# Patient Record
Sex: Female | Born: 1995 | Hispanic: Yes | Marital: Married | Smoking: Current every day smoker
Health system: Southern US, Community
[De-identification: ages and names within clinical notes are randomized; demographics above are authoritative.]

## PROBLEM LIST (undated history)

## (undated) ENCOUNTER — Inpatient Hospital Stay: Payer: Self-pay

## (undated) DIAGNOSIS — F329 Major depressive disorder, single episode, unspecified: Secondary | ICD-10-CM

## (undated) DIAGNOSIS — D649 Anemia, unspecified: Secondary | ICD-10-CM

## (undated) DIAGNOSIS — D573 Sickle-cell trait: Secondary | ICD-10-CM

## (undated) DIAGNOSIS — F419 Anxiety disorder, unspecified: Secondary | ICD-10-CM

## (undated) DIAGNOSIS — F32A Depression, unspecified: Secondary | ICD-10-CM

## (undated) DIAGNOSIS — C801 Malignant (primary) neoplasm, unspecified: Secondary | ICD-10-CM

## (undated) DIAGNOSIS — Z5189 Encounter for other specified aftercare: Secondary | ICD-10-CM

## (undated) HISTORY — DX: Encounter for other specified aftercare: Z51.89

## (undated) HISTORY — PX: NECK LESION BIOPSY: SHX2078

## (undated) HISTORY — PX: PORT-A-CATH REMOVAL: SHX5289

---
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
## 2016-05-18 ENCOUNTER — Emergency Department: Payer: Medicaid Other

## 2016-05-18 ENCOUNTER — Encounter: Payer: Self-pay | Admitting: Medical Oncology

## 2016-05-18 ENCOUNTER — Emergency Department
Admission: EM | Admit: 2016-05-18 | Discharge: 2016-05-18 | Disposition: A | Discharge status: Home / Self Care | Payer: Medicaid Other | Attending: Emergency Medicine | Admitting: Emergency Medicine

## 2016-05-18 DIAGNOSIS — Z85828 Personal history of other malignant neoplasm of skin: Secondary | ICD-10-CM | POA: Insufficient documentation

## 2016-05-18 DIAGNOSIS — Y929 Unspecified place or not applicable: Secondary | ICD-10-CM | POA: Diagnosis not present

## 2016-05-18 DIAGNOSIS — Y998 Other external cause status: Secondary | ICD-10-CM | POA: Insufficient documentation

## 2016-05-18 DIAGNOSIS — Y9302 Activity, running: Secondary | ICD-10-CM | POA: Diagnosis not present

## 2016-05-18 DIAGNOSIS — W2203XA Walked into furniture, initial encounter: Secondary | ICD-10-CM | POA: Diagnosis not present

## 2016-05-18 DIAGNOSIS — S99921A Unspecified injury of right foot, initial encounter: Secondary | ICD-10-CM | POA: Insufficient documentation

## 2016-05-18 NOTE — ED Notes (Signed)
Pt verbalized understanding of discharge instructions. NAD at this time. 

## 2016-05-18 NOTE — ED Provider Notes (Signed)
[facility] Emergency Department Provider Note  ____________________________________________  Time seen: Approximately 4:25 PM  I have reviewed the triage vital signs and the nursing notes.   HISTORY  Chief Complaint Toe Injury    HPI Michele Alexander is a 20 y.o. female that presents to emergency department with second metatarsal pain on right foot after stubbing it this morning. Patient states that she is having a lot of pain in that toe. Pain does not radiate. No swelling or bruising. Patient states that she would like to know if it is broken because she is on her feet a lot at work. Patient has not taken anything for pain. Patient denies any other injury.   Past Medical History:  Diagnosis Date  . Anemia   . Anxiety   . Cancer ([place])    Hodgkin's Lymphoma  . Depression   . Hodgkin's disease(201)     Patient Active Problem List   Diagnosis Date Noted  . Abortion, missed 12/21/2015  . Young primigravida 04/23/2015  . Supervision of high risk pregnancy, antepartum 04/23/2015  . History of Hodgkin's lymphoma 04/23/2015  . Sickle cell trait ([place]) 04/23/2015  . Labor and delivery indication for care or intervention 04/21/2015  . Indication for care in labor and delivery, antepartum 04/19/2015  . Pelvic pressure in pregnancy, antepartum 03/21/2015  . Swelling 02/17/2015  . Nausea and vomiting in pregnancy 01/15/2015  . Nausea & vomiting 01/14/2015  . Pregnancy 01/05/2015    Past Surgical History:  Procedure Laterality Date  . DILATION AND EVACUATION N/A 12/21/2015   Procedure: DILATATION AND EVACUATION;  Surgeon: Will Bonnet, MD;  Location: [facility] ORS;  Service: Gynecology;  Laterality: N/A;  . NECK LESION BIOPSY    . OTHER SURGICAL HISTORY N/A 2015   Endoscopy  . PORT-A-CATH REMOVAL    . PORTACATH PLACEMENT Left 2010    Prior to Admission medications   Medication Sig Start Date End Date Taking? Authorizing Provider   acetaminophen-codeine (TYLENOL #3) 300-30 MG tablet Take 2 tablets by mouth every 4 (four) hours as needed for moderate pain. 12/21/15   Will Bonnet, MD  ibuprofen (ADVIL,MOTRIN) 600 MG tablet Take 1 tablet (600 mg total) by mouth every 6 (six) hours as needed for mild pain or cramping. 12/21/15   Will Bonnet, MD  traMADol (ULTRAM) 50 MG tablet Take 1 tablet (50 mg total) by mouth every 6 (six) hours as needed. 12/19/15 12/18/16  Gregor Hams, MD    Allergies Other; Nexium [esomeprazole magnesium]; and Cheese  Family History  Problem Relation Age of Onset  . Hypertension Mother   . Diabetes Maternal Grandfather   . Hypertension Maternal Grandfather   . Cancer Maternal Grandmother   . Cancer Paternal Grandfather     Social History Social History  Substance Use Topics  . Smoking status: Never Smoker  . Smokeless tobacco: Never Used  . Alcohol use No     Review of Systems  Constitutional: No fever/chills Cardiovascular: no chest pain. Respiratory: no cough. No SOB. Gastrointestinal: No nausea, no vomiting.  Musculoskeletal: Negative for musculoskeletal pain. Skin: Negative for rash, abrasions, ecchymosis.  ____________________________________________   PHYSICAL EXAM:  VITAL SIGNS: ED Triage Vitals  Enc Vitals Group     BP 05/18/16 1541 121/72     Pulse Rate 05/18/16 1541 85     Resp 05/18/16 1541 16     Temp 05/18/16 1541 97.9 F (36.6 C)     Temp Source 05/18/16 1541 Oral  SpO2 05/18/16 1541 100 %     Weight 05/18/16 1538 82 lb (37.2 kg)     Height 05/18/16 1538 4\' 9"  (1.448 m)     Head Circumference --      Peak Flow --      Pain Score 05/18/16 1538 8     Pain Loc --      Pain Edu? --      Excl. in [place]? --      Constitutional: Alert and oriented. Well appearing and in no acute distress. Eyes: Conjunctivae are normal. PERRL. EOMI. Head: Atraumatic. ENT:      Ears:       Nose: No congestion/rhinnorhea.      Mouth/Throat: Mucous membranes  are moist.  Neck: No stridor.  Cardiovascular: Normal rate, regular rhythm. Normal S1 and S2.  Good peripheral circulation. Respiratory: Normal respiratory effort without tachypnea or retractions. Lungs CTAB. Good air entry to the bases with no decreased or absent breath sounds. Musculoskeletal: Full range of motion to all extremities. No gross deformities appreciated. Full range of motion of the second metatarsal on right foot. Neurologic:  Normal speech and language. No gross focal neurologic deficits are appreciated.  Skin:  Skin is warm, dry and intact. No rash noted. No bruising or swelling to the second metatarsal on right foot. Psychiatric: Mood and affect are normal. Speech and behavior are normal. Patient exhibits appropriate insight and judgement.   ____________________________________________   LABS (all labs ordered are listed, but only abnormal results are displayed)  Labs Reviewed - No data to display ____________________________________________  EKG   ____________________________________________  RADIOLOGY Robinette Haines, personally viewed and evaluated these images (plain radiographs) as part of my medical decision making, as well as reviewing the written report by the radiologist.  Dg Toe 2nd Right  Result Date: 05/18/2016 CLINICAL DATA:  Ran into couch with right foot. Second toe pain and numbness. Initial encounter. EXAM: RIGHT SECOND TOE COMPARISON:  None. FINDINGS: There is no evidence of fracture or dislocation. There is no evidence of arthropathy or other focal bone abnormality. Soft tissues are unremarkable. IMPRESSION: Negative. Electronically Signed   By: Logan Bores M.D.   On: 05/18/2016 17:10    ____________________________________________    PROCEDURES  Procedure(s) performed:    Procedures    Medications - No data to display   ____________________________________________   INITIAL IMPRESSION / ASSESSMENT AND PLAN / ED  COURSE  Pertinent labs & imaging results that were available during my care of the patient were reviewed by me and considered in my medical decision making (see chart for details).  Review of the [place] CSRS was performed in accordance of the [place] prior to dispensing any controlled drugs.  Clinical Course     Patient's diagnosis is consistent with toe injury. No bony abnormalities seen on x-ray. Patient is to follow up with PCP as needed or otherwise directed. Patient is given ED precautions to return to the ED for any worsening or new symptoms.     ____________________________________________  FINAL CLINICAL IMPRESSION(S) / ED DIAGNOSES  Final diagnoses:  Injury of toe on right foot, initial encounter      NEW MEDICATIONS STARTED DURING THIS VISIT:  Discharge Medication List as of 05/18/2016  5:32 PM          This chart was dictated using voice recognition software/Dragon. Despite best efforts to proofread, errors can occur which can change the meaning. Any change was purely unintentional.   Laban Emperor, PA-C 05/18/16 1752  Delman Kitten, MD 05/25/16 [phone]

## 2016-05-18 NOTE — ED Triage Notes (Signed)
Pt stubbed rt foot second toe yesterday.

## 2016-06-20 ENCOUNTER — Emergency Department
Admission: EM | Admit: 2016-06-20 | Discharge: 2016-06-20 | Disposition: A | Discharge status: Home / Self Care | Payer: Medicaid Other | Attending: Emergency Medicine | Admitting: Emergency Medicine

## 2016-06-20 ENCOUNTER — Emergency Department: Payer: Medicaid Other

## 2016-06-20 DIAGNOSIS — N83202 Unspecified ovarian cyst, left side: Secondary | ICD-10-CM | POA: Diagnosis not present

## 2016-06-20 DIAGNOSIS — R102 Pelvic and perineal pain: Secondary | ICD-10-CM | POA: Diagnosis present

## 2016-06-20 DIAGNOSIS — Z791 Long term (current) use of non-steroidal anti-inflammatories (NSAID): Secondary | ICD-10-CM | POA: Diagnosis not present

## 2016-06-20 DIAGNOSIS — Z79899 Other long term (current) drug therapy: Secondary | ICD-10-CM | POA: Diagnosis not present

## 2016-06-20 LAB — COMPREHENSIVE METABOLIC PANEL
ALBUMIN: 3.8 g/dL (ref 3.5–5.0)
ALT: 16 U/L (ref 14–54)
AST: 21 U/L (ref 15–41)
Alkaline Phosphatase: 93 U/L (ref 38–126)
Anion gap: 6 (ref 5–15)
BUN: 11 mg/dL (ref 6–20)
CHLORIDE: 105 mmol/L (ref 101–111)
CO2: 26 mmol/L (ref 22–32)
Calcium: 8.8 mg/dL — ABNORMAL LOW (ref 8.9–10.3)
Creatinine, Ser: 0.58 mg/dL (ref 0.44–1.00)
GFR calc Af Amer: >60 mL/min (ref >60)
Glucose, Bld: 97 mg/dL (ref 65–99)
Potassium: 3.8 mmol/L (ref 3.5–5.1)
SODIUM: 137 mmol/L (ref 135–145)
Total Bilirubin: 0.1 mg/dL — ABNORMAL LOW (ref 0.3–1.2)
Total Protein: 7.1 g/dL (ref 6.5–8.1)

## 2016-06-20 LAB — CBC
HCT: 34.8 % — ABNORMAL LOW (ref 35.0–47.0)
Hemoglobin: 11.7 g/dL — ABNORMAL LOW (ref 12.0–16.0)
MCH: 25.7 pg — ABNORMAL LOW (ref 26.0–34.0)
MCHC: 33.7 g/dL (ref 32.0–36.0)
MCV: 76.4 fL — ABNORMAL LOW (ref 80.0–100.0)
Platelets: 395 10*3/uL (ref 150–440)
RBC: 4.56 MIL/uL (ref 3.80–5.20)
RDW: 14.6 % — AB (ref 11.5–14.5)
WBC: 4.7 10*3/uL (ref 3.6–11.0)

## 2016-06-20 LAB — POCT PREGNANCY, URINE: PREG TEST UR: NEGATIVE

## 2016-06-20 LAB — URINALYSIS, COMPLETE (UACMP) WITH MICROSCOPIC
BACTERIA UA: NONE SEEN
BILIRUBIN URINE: NEGATIVE
Glucose, UA: NEGATIVE mg/dL
Hgb urine dipstick: NEGATIVE
KETONES UR: NEGATIVE mg/dL
LEUKOCYTES UA: NEGATIVE
Nitrite: NEGATIVE
PROTEIN: NEGATIVE mg/dL
RBC / HPF: NONE SEEN RBC/hpf (ref 0–5)
SPECIFIC GRAVITY, URINE: 1.019 (ref 1.005–1.030)
pH: 8 (ref 5.0–8.0)

## 2016-06-20 LAB — LIPASE, BLOOD: Lipase: 35 U/L (ref 11–51)

## 2016-06-20 MED ORDER — ACETAMINOPHEN 500 MG PO TABS
1000.0000 mg | ORAL_TABLET | Freq: Once | ORAL | Status: AC
  Administered 2016-06-20: 1000 mg via ORAL
  Filled 2016-06-20: qty 2

## 2016-06-20 MED ORDER — NAPROXEN 500 MG PO TABS: 500.0000 mg | ORAL_TABLET | Freq: Two times a day (BID) | ORAL | 2 refills | Status: DC

## 2016-06-20 NOTE — ED Notes (Signed)
Dr Kinner at bedside. 

## 2016-06-20 NOTE — ED Provider Notes (Signed)
[facility] Emergency Department Provider Note   ____________________________________________    I have reviewed the triage vital signs and the nursing notes.   HISTORY  Chief Complaint Abdominal Pain and Mass     HPI Michele Alexander is a 21 y.o. female who presents with complaints of left-sided pelvic pain which is moderate. Patient reports this feels similar to when she had a ovarian cyst in the past. She reports the pain started today and has been unrelenting. She denies fevers or chills. She denies vaginal discharge. No nausea or vomiting. She feels that she can palpate something in her left pelvis   Past Medical History:  Diagnosis Date  . Anemia   . Anxiety   . Cancer ([place])    Hodgkin's Lymphoma  . Depression   . Hodgkin's disease(201)     Patient Active Problem List   Diagnosis Date Noted  . Abortion, missed 12/21/2015  . Young primigravida 04/23/2015  . Supervision of high risk pregnancy, antepartum 04/23/2015  . History of Hodgkin's lymphoma 04/23/2015  . Sickle cell trait ([place]) 04/23/2015  . Labor and delivery indication for care or intervention 04/21/2015  . Indication for care in labor and delivery, antepartum 04/19/2015  . Pelvic pressure in pregnancy, antepartum 03/21/2015  . Swelling 02/17/2015  . Nausea and vomiting in pregnancy 01/15/2015  . Nausea & vomiting 01/14/2015  . Pregnancy 01/05/2015    Past Surgical History:  Procedure Laterality Date  . DILATION AND EVACUATION N/A 12/21/2015   Procedure: DILATATION AND EVACUATION;  Surgeon: Will Bonnet, MD;  Location: [facility] ORS;  Service: Gynecology;  Laterality: N/A;  . NECK LESION BIOPSY    . OTHER SURGICAL HISTORY N/A 2015   Endoscopy  . PORT-A-CATH REMOVAL    . PORTACATH PLACEMENT Left 2010    Prior to Admission medications   Medication Sig Start Date End Date Taking? Authorizing Provider  acetaminophen-codeine (TYLENOL #3) 300-30 MG tablet Take 2  tablets by mouth every 4 (four) hours as needed for moderate pain. 12/21/15   Will Bonnet, MD  ibuprofen (ADVIL,MOTRIN) 600 MG tablet Take 1 tablet (600 mg total) by mouth every 6 (six) hours as needed for mild pain or cramping. 12/21/15   Will Bonnet, MD  traMADol (ULTRAM) 50 MG tablet Take 1 tablet (50 mg total) by mouth every 6 (six) hours as needed. 12/19/15 12/18/16  Gregor Hams, MD     Allergies Other; Nexium [esomeprazole magnesium]; and Cheese  Family History  Problem Relation Age of Onset  . Hypertension Mother   . Diabetes Maternal Grandfather   . Hypertension Maternal Grandfather   . Cancer Maternal Grandmother   . Cancer Paternal Grandfather     Social History Social History  Substance Use Topics  . Smoking status: Never Smoker  . Smokeless tobacco: Never Used  . Alcohol use No    Review of Systems  Constitutional: No fever/chills  Cardiovascular: Denies chest pain. Respiratory: Denies shortness of breath. Gastrointestinal: As above  Genitourinary: Negative for dysuria. Musculoskeletal: Negative for back pain. Skin: Negative for rash.   10-point ROS otherwise negative.  ____________________________________________   PHYSICAL EXAM:  VITAL SIGNS: ED Triage Vitals  Enc Vitals Group     BP 06/20/16 1811 120/66     Pulse Rate 06/20/16 1811 85     Resp 06/20/16 1811 18     Temp 06/20/16 1811 98.2 F (36.8 C)     Temp Source 06/20/16 1811 Oral  SpO2 06/20/16 1811 100 %     Weight 06/20/16 1812 83 lb (37.6 kg)     Height 06/20/16 1812 4\' 9"  (1.448 m)     Head Circumference --      Peak Flow --      Pain Score 06/20/16 1812 7     Pain Loc --      Pain Edu? --      Excl. in [place]? --     Constitutional: Alert and oriented. No acute distress. Pleasant and interactive Eyes: Conjunctivae are normal.   Nose: No congestion/rhinnorhea. Mouth/Throat: Mucous membranes are moist.   Cardiovascular: Normal rate, regular rhythm.   Good  peripheral circulation. Respiratory: Normal respiratory effort.  No retractions. Lungs CTAB. Gastrointestinal: Mild tenderness to palpation in the left pelvis. No distention.  No CVA tenderness. Genitourinary: deferred Musculoskeletal: .  Warm and well perfused Neurologic:  Normal speech and language. No gross focal neurologic deficits are appreciated.  Skin:  Skin is warm, dry and intact. No rash noted. Psychiatric: Mood and affect are normal. Speech and behavior are normal.  ____________________________________________   LABS (all labs ordered are listed, but only abnormal results are displayed)  Labs Reviewed  COMPREHENSIVE METABOLIC PANEL - Abnormal; Notable for the following:       Result Value   Calcium 8.8 (*)    Total Bilirubin 0.1 (*)    All other components within normal limits  CBC - Abnormal; Notable for the following:    Hemoglobin 11.7 (*)    HCT 34.8 (*)    MCV 76.4 (*)    MCH 25.7 (*)    RDW 14.6 (*)    All other components within normal limits  URINALYSIS, COMPLETE (UACMP) WITH MICROSCOPIC - Abnormal; Notable for the following:    Color, Urine YELLOW (*)    APPearance CLEAR (*)    Squamous Epithelial / LPF 0-5 (*)    All other components within normal limits  LIPASE, BLOOD  POC URINE PREG, ED  POCT PREGNANCY, URINE   ____________________________________________  EKG  None ____________________________________________  RADIOLOGY  Consistent with left ovarian cyst, no torsion ____________________________________________   PROCEDURES  Procedure(s) performed: No    Critical Care performed: No ____________________________________________   INITIAL IMPRESSION / ASSESSMENT AND PLAN / ED COURSE  Pertinent labs & imaging results that were available during my care of the patient were reviewed by me and considered in my medical decision making (see chart for details).  Patient presents with left-sided pelvic discomfort. I do suspect ovarian cysts,  we will obtain ultrasound to rule out torsion. Analgesics provided.  Clinical Course   Ultrasound confirms 3.7 cm cyst but no torsion. We'll Rx analgesics and have the patient follow up with gynecology ____________________________________________   FINAL CLINICAL IMPRESSION(S) / ED DIAGNOSES  Final diagnoses:  Pelvic pain  Pelvic pain      NEW MEDICATIONS STARTED DURING THIS VISIT:  New Prescriptions   No medications on file     Note:  This document was prepared using Dragon voice recognition software and may include unintentional dictation errors.    Lavonia Drafts, MD 06/20/16 2212

## 2016-06-20 NOTE — ED Triage Notes (Signed)
Pt reports LLQ abdominal pain. Pt states pain feels like it is in her ovary. Pt states today she felt a lump at the pain site. Denies NVD.

## 2016-07-28 ENCOUNTER — Encounter: Payer: Self-pay | Admitting: Emergency Medicine

## 2016-07-28 ENCOUNTER — Emergency Department
Admission: EM | Admit: 2016-07-28 | Discharge: 2016-07-28 | Disposition: A | Discharge status: Home / Self Care | Payer: Medicaid Other | Attending: Emergency Medicine | Admitting: Emergency Medicine

## 2016-07-28 DIAGNOSIS — Z8571 Personal history of Hodgkin lymphoma: Secondary | ICD-10-CM | POA: Insufficient documentation

## 2016-07-28 DIAGNOSIS — Z79899 Other long term (current) drug therapy: Secondary | ICD-10-CM | POA: Insufficient documentation

## 2016-07-28 DIAGNOSIS — N9489 Other specified conditions associated with female genital organs and menstrual cycle: Secondary | ICD-10-CM | POA: Diagnosis not present

## 2016-07-28 DIAGNOSIS — N939 Abnormal uterine and vaginal bleeding, unspecified: Secondary | ICD-10-CM | POA: Diagnosis not present

## 2016-07-28 LAB — BASIC METABOLIC PANEL
ANION GAP: 6 (ref 5–15)
BUN: 10 mg/dL (ref 6–20)
CALCIUM: 8.6 mg/dL — AB (ref 8.9–10.3)
CHLORIDE: 102 mmol/L (ref 101–111)
CO2: 28 mmol/L (ref 22–32)
Creatinine, Ser: 0.71 mg/dL (ref 0.44–1.00)
GFR calc non Af Amer: >60 mL/min (ref >60)
Glucose, Bld: 88 mg/dL (ref 65–99)
POTASSIUM: 3.6 mmol/L (ref 3.5–5.1)
Sodium: 136 mmol/L (ref 135–145)

## 2016-07-28 LAB — CBC
HEMATOCRIT: 37.8 % (ref 35.0–47.0)
HEMOGLOBIN: 12.1 g/dL (ref 12.0–16.0)
MCH: 24.5 pg — ABNORMAL LOW (ref 26.0–34.0)
MCHC: 32 g/dL (ref 32.0–36.0)
MCV: 76.5 fL — ABNORMAL LOW (ref 80.0–100.0)
Platelets: 373 10*3/uL (ref 150–440)
RBC: 4.94 MIL/uL (ref 3.80–5.20)
RDW: 15 % — ABNORMAL HIGH (ref 11.5–14.5)
WBC: 4.1 10*3/uL (ref 3.6–11.0)

## 2016-07-28 LAB — HCG, QUANTITATIVE, PREGNANCY: hCG, Beta Chain, Quant, S: <1 m[IU]/mL (ref <5)

## 2016-07-28 NOTE — ED Triage Notes (Signed)
Pt presents to ED c/o vaginal bleeding; had 2 periods this month. Pt takes "the patch" for 5 months now. Hx of ovarian cyst. States left pelvic pain at times.

## 2016-07-28 NOTE — ED Notes (Signed)
Pt discharged to home.  Family member driving.  Discharge instructions reviewed.  Verbalized understanding.  No questions or concerns at this time.  Teach back verified.  Pt in NAD.  No items left in ED.   

## 2016-07-28 NOTE — ED Provider Notes (Signed)
[facility] Emergency Department Provider Note  Time seen: 8:01 PM  I have reviewed the triage vital signs and the nursing notes.   HISTORY  Chief Complaint Vaginal Bleeding    HPI Michele Alexander is a 21 y.o. female With a past medical history of Hodgkin's lymphoma in remission, anemia, who presents to the emergency departmentwith vaginal bleeding. According to the patient she normally has her period the first week of the month which was true for thismonth as well. However the patient once again began bleeding several days ago and states it has become heavy today. States mild lower abdominal cramping. States a history of a miscarriage in the past which concerns her for a possible miscarriage currently. Patient is taking a progesterone only transdermal birth control. Denies any abdominal "pain." Patient states she took a urine ppregnancy test at home which was negative.  Past Medical History:  Diagnosis Date  . Anemia   . Anxiety   . Cancer ([place])    Hodgkin's Lymphoma  . Depression   . Hodgkin's disease(201)     Patient Active Problem List   Diagnosis Date Noted  . Abortion, missed 12/21/2015  . Young primigravida 04/23/2015  . Supervision of high risk pregnancy, antepartum 04/23/2015  . History of Hodgkin's lymphoma 04/23/2015  . Sickle cell trait ([place]) 04/23/2015  . Labor and delivery indication for care or intervention 04/21/2015  . Indication for care in labor and delivery, antepartum 04/19/2015  . Pelvic pressure in pregnancy, antepartum 03/21/2015  . Swelling 02/17/2015  . Nausea and vomiting in pregnancy 01/15/2015  . Nausea & vomiting 01/14/2015  . Pregnancy 01/05/2015    Past Surgical History:  Procedure Laterality Date  . DILATION AND EVACUATION N/A 12/21/2015   Procedure: DILATATION AND EVACUATION;  Surgeon: Will Bonnet, MD;  Location: [facility] ORS;  Service: Gynecology;  Laterality: N/A;  . NECK LESION BIOPSY    . OTHER  SURGICAL HISTORY N/A 2015   Endoscopy  . PORT-A-CATH REMOVAL    . PORTACATH PLACEMENT Left 2010    Prior to Admission medications   Medication Sig Start Date End Date Taking? Authorizing Provider  acetaminophen-codeine (TYLENOL #3) 300-30 MG tablet Take 2 tablets by mouth every 4 (four) hours as needed for moderate pain. 12/21/15   Will Bonnet, MD  ibuprofen (ADVIL,MOTRIN) 600 MG tablet Take 1 tablet (600 mg total) by mouth every 6 (six) hours as needed for mild pain or cramping. 12/21/15   Will Bonnet, MD  naproxen (NAPROSYN) 500 MG tablet Take 1 tablet (500 mg total) by mouth 2 (two) times daily with a meal. 06/20/16   Lavonia Drafts, MD  traMADol (ULTRAM) 50 MG tablet Take 1 tablet (50 mg total) by mouth every 6 (six) hours as needed. 12/19/15 12/18/16  Gregor Hams, MD    Allergies  Allergen Reactions  . Other Hives    Oral CT contrast  . Nexium [Esomeprazole Magnesium] Swelling  . Cheese Rash    Family History  Problem Relation Age of Onset  . Hypertension Mother   . Diabetes Maternal Grandfather   . Hypertension Maternal Grandfather   . Cancer Maternal Grandmother   . Cancer Paternal Grandfather     Social History Social History  Substance Use Topics  . Smoking status: Never Smoker  . Smokeless tobacco: Never Used  . Alcohol use No    Review of Systems Constitutional: Negative for fever Cardiovascular: Negative for chest pain. Respiratory: Negative for shortness of breath. Gastrointestinal: Negative  for abdominal pain. Positive for vaginal bleeding. Neurological: Negative for headache 10-point ROS otherwise negative.  ____________________________________________   PHYSICAL EXAM:  VITAL SIGNS: ED Triage Vitals  Enc Vitals Group     BP 07/28/16 1933 114/84     Pulse Rate 07/28/16 1933 81     Resp 07/28/16 1933 18     Temp 07/28/16 1933 98.3 F (36.8 C)     Temp Source 07/28/16 1933 Oral     SpO2 07/28/16 1933 100 %     Weight 07/28/16 1934  82 lb (37.2 kg)     Height 07/28/16 1934 4\' 9"  (1.448 m)     Head Circumference --      Peak Flow --      Pain Score 07/28/16 1934 7     Pain Loc --      Pain Edu? --      Excl. in [place]? --     Constitutional: Alert and oriented. Well appearing and in no distress. Eyes: Normal exam ENT   Head: Normocephalic and atraumatic   Mouth/Throat: Mucous membranes are moist. Cardiovascular: Normal rate, regular rhythm. No murmur Respiratory: Normal respiratory effort without tachypnea nor retractions. Breath sounds are clear  Gastrointestinal: Soft and nontender. No distention.  Musculoskeletal: Nontender with normal range of motion in all extremities. Neurologic:  Normal speech and language. No gross focal neurologic deficits Skin:  Skin is warm, dry and intact.  Psychiatric: Mood and affect are normal.   ____________________________________________   INITIAL IMPRESSION / ASSESSMENT AND PLAN / ED COURSE  Pertinent labs & imaging results that were available during my care of the patient were reviewed by me and considered in my medical decision making (see chart for details).  patient presents the Department of vaginal bleeding. We will check labs perform a pelvic examination and closely monitor. Overall the patient appears well. Nontender abdominal exam.  Pelvic exam shows mild cervical bleeding, no clot. No significant bleeding. No significant tenderness.  Labs are normal. Beta-hCG is negative. Patient will follow up with [facility].  ____________________________________________   FINAL CLINICAL IMPRESSION(S) / ED DIAGNOSES  vaginal bleeding    Harvest Dark, MD 07/28/16 2227

## 2016-08-23 ENCOUNTER — Encounter: Payer: Self-pay | Admitting: Emergency Medicine

## 2016-08-23 ENCOUNTER — Emergency Department: Payer: Medicaid Other

## 2016-08-23 ENCOUNTER — Emergency Department
Admission: EM | Admit: 2016-08-23 | Discharge: 2016-08-23 | Disposition: A | Discharge status: Home / Self Care | Payer: Medicaid Other | Attending: Emergency Medicine | Admitting: Emergency Medicine

## 2016-08-23 DIAGNOSIS — O26891 Other specified pregnancy related conditions, first trimester: Secondary | ICD-10-CM | POA: Diagnosis present

## 2016-08-23 DIAGNOSIS — O200 Threatened abortion: Secondary | ICD-10-CM | POA: Diagnosis not present

## 2016-08-23 DIAGNOSIS — Z3A01 Less than 8 weeks gestation of pregnancy: Secondary | ICD-10-CM | POA: Diagnosis not present

## 2016-08-23 DIAGNOSIS — R102 Pelvic and perineal pain: Secondary | ICD-10-CM

## 2016-08-23 DIAGNOSIS — O26899 Other specified pregnancy related conditions, unspecified trimester: Secondary | ICD-10-CM

## 2016-08-23 LAB — CBC
HCT: 37.8 % (ref 35.0–47.0)
HEMOGLOBIN: 12.6 g/dL (ref 12.0–16.0)
MCH: 25.4 pg — AB (ref 26.0–34.0)
MCHC: 33.4 g/dL (ref 32.0–36.0)
MCV: 76 fL — ABNORMAL LOW (ref 80.0–100.0)
Platelets: 379 10*3/uL (ref 150–440)
RBC: 4.97 MIL/uL (ref 3.80–5.20)
RDW: 15.8 % — ABNORMAL HIGH (ref 11.5–14.5)
WBC: 5.4 10*3/uL (ref 3.6–11.0)

## 2016-08-23 LAB — LIPASE, BLOOD: Lipase: 37 U/L (ref 11–51)

## 2016-08-23 LAB — COMPREHENSIVE METABOLIC PANEL
ALBUMIN: 4.2 g/dL (ref 3.5–5.0)
ALT: 19 U/L (ref 14–54)
ANION GAP: 4 — AB (ref 5–15)
AST: 24 U/L (ref 15–41)
Alkaline Phosphatase: 88 U/L (ref 38–126)
BUN: 11 mg/dL (ref 6–20)
CALCIUM: 8.9 mg/dL (ref 8.9–10.3)
CO2: 24 mmol/L (ref 22–32)
Chloride: 105 mmol/L (ref 101–111)
Creatinine, Ser: 0.34 mg/dL — ABNORMAL LOW (ref 0.44–1.00)
GFR calc Af Amer: >60 mL/min (ref >60)
GFR calc non Af Amer: >60 mL/min (ref >60)
GLUCOSE: 90 mg/dL (ref 65–99)
POTASSIUM: 4 mmol/L (ref 3.5–5.1)
SODIUM: 133 mmol/L — AB (ref 135–145)
Total Bilirubin: 0.7 mg/dL (ref 0.3–1.2)
Total Protein: 7.4 g/dL (ref 6.5–8.1)

## 2016-08-23 LAB — URINALYSIS, COMPLETE (UACMP) WITH MICROSCOPIC
BACTERIA UA: NONE SEEN
Bilirubin Urine: NEGATIVE
Glucose, UA: NEGATIVE mg/dL
Hgb urine dipstick: NEGATIVE
KETONES UR: NEGATIVE mg/dL
Leukocytes, UA: NEGATIVE
Nitrite: NEGATIVE
PROTEIN: NEGATIVE mg/dL
RBC / HPF: NONE SEEN RBC/hpf (ref 0–5)
Specific Gravity, Urine: 1.019 (ref 1.005–1.030)
pH: 5 (ref 5.0–8.0)

## 2016-08-23 LAB — HCG, QUANTITATIVE, PREGNANCY: HCG, BETA CHAIN, QUANT, S: 659 m[IU]/mL — AB (ref <5)

## 2016-08-23 LAB — PREGNANCY, URINE: PREG TEST UR: POSITIVE — AB

## 2016-08-23 NOTE — ED Triage Notes (Signed)
Pt to ed with c/o "lump" on left side of abd.  Pt states she found out she was pregnant 3 days ago and is concerned that she is having a miscarrage.

## 2016-08-23 NOTE — ED Provider Notes (Signed)
[facility] Emergency Department Provider Note        Time seen: ----------------------------------------- 3:33 PM on 08/23/2016 -----------------------------------------    I have reviewed the triage vital signs and the nursing notes.   HISTORY  Chief Complaint Abdominal Pain    HPI Michele Alexander is a 21 y.o. female who presents to ER for left lower quadrant abdominal pain. Patient states she found out she was pregnant around 3 days ago and states she is probably [redacted] weeks pregnant. Patient concerned she may be having a miscarriage. She is not having bleeding or vomiting. She has had some nausea. She denies fevers, chills or other complaints.   Past Medical History:  Diagnosis Date  . Anemia   . Anxiety   . Cancer ([place])    Hodgkin's Lymphoma  . Depression   . Hodgkin's disease(201)     Patient Active Problem List   Diagnosis Date Noted  . Abortion, missed 12/21/2015  . Young primigravida 04/23/2015  . Supervision of high risk pregnancy, antepartum 04/23/2015  . History of Hodgkin's lymphoma 04/23/2015  . Sickle cell trait ([place]) 04/23/2015  . Labor and delivery indication for care or intervention 04/21/2015  . Indication for care in labor and delivery, antepartum 04/19/2015  . Pelvic pressure in pregnancy, antepartum 03/21/2015  . Swelling 02/17/2015  . Nausea and vomiting in pregnancy 01/15/2015  . Nausea & vomiting 01/14/2015  . Pregnancy 01/05/2015    Past Surgical History:  Procedure Laterality Date  . DILATION AND EVACUATION N/A 12/21/2015   Procedure: DILATATION AND EVACUATION;  Surgeon: Will Bonnet, MD;  Location: [facility] ORS;  Service: Gynecology;  Laterality: N/A;  . NECK LESION BIOPSY    . OTHER SURGICAL HISTORY N/A 2015   Endoscopy  . PORT-A-CATH REMOVAL    . PORTACATH PLACEMENT Left 2010    Allergies Other; Nexium [esomeprazole magnesium]; and Cheese  Social History Social History  Substance Use Topics  .  Smoking status: Never Smoker  . Smokeless tobacco: Never Used  . Alcohol use No    Review of Systems Constitutional: Negative for fever. Cardiovascular: Negative for chest pain. Respiratory: Negative for shortness of breath. Gastrointestinal: Positive for abdominal pain Genitourinary: Negative for dysuria. negative for vaginal bleeding Musculoskeletal: Negative for back pain. Skin: Negative for rash. Neurological: Negative for headaches, focal weakness or numbness.  10-point ROS otherwise negative.  ____________________________________________   PHYSICAL EXAM:  VITAL SIGNS: ED Triage Vitals  Enc Vitals Group     BP 08/23/16 1249 119/74     Pulse Rate 08/23/16 1249 90     Resp 08/23/16 1249 18     Temp 08/23/16 1249 98.2 F (36.8 C)     Temp Source 08/23/16 1249 Oral     SpO2 08/23/16 1249 100 %     Weight 08/23/16 1250 82 lb (37.2 kg)     Height --      Head Circumference --      Peak Flow --      Pain Score 08/23/16 1250 8     Pain Loc --      Pain Edu? --      Excl. in [place]? --    Constitutional: Alert and oriented. Well appearing and in no distress. Eyes: Conjunctivae are normal. PERRL. Normal extraocular movements. Cardiovascular: Normal rate, regular rhythm. No murmurs, rubs, or gallops. Respiratory: Normal respiratory effort without tachypnea nor retractions. Breath sounds are clear and equal bilaterally. No wheezes/rales/rhonchi. Gastrointestinal: Left lower quadrant tenderness, no rebound or guarding.  Normal bowel sounds. Musculoskeletal: Nontender with normal range of motion in all extremities. No lower extremity tenderness nor edema. Neurologic:  Normal speech and language. No gross focal neurologic deficits are appreciated.  Skin:  Skin is warm, dry and intact. No rash noted. Psychiatric: Mood and affect are normal. Speech and behavior are normal.  ____________________________________________  ED COURSE:  Pertinent labs & imaging results that were  available during my care of the patient were reviewed by me and considered in my medical decision making (see chart for details). Patient presents to the ER in no distress with possible pregnancy and pelvic pain. We will assess with labs and likely ultrasound.   Procedures ____________________________________________   LABS (pertinent positives/negatives)  Labs Reviewed  COMPREHENSIVE METABOLIC PANEL - Abnormal; Notable for the following:       Result Value   Sodium 133 (*)    Creatinine, Ser 0.34 (*)    Anion gap 4 (*)    All other components within normal limits  CBC - Abnormal; Notable for the following:    MCV 76.0 (*)    MCH 25.4 (*)    RDW 15.8 (*)    All other components within normal limits  URINALYSIS, COMPLETE (UACMP) WITH MICROSCOPIC - Abnormal; Notable for the following:    Color, Urine YELLOW (*)    APPearance CLEAR (*)    Squamous Epithelial / LPF 0-5 (*)    All other components within normal limits  HCG, QUANTITATIVE, PREGNANCY - Abnormal; Notable for the following:    hCG, Beta Chain, Quant, S 659 (*)    All other components within normal limits  LIPASE, BLOOD  PREGNANCY, URINE  POC URINE PREG, ED    RADIOLOGY Images were viewed by me  Pregnancy ultrasound IMPRESSION: 1. Normal appearing uterus and ovaries with no evidence of ovarian torsion. 2. No intrauterine or extrauterine gestation seen at this time. This does not exclude the possibility of a normal intrauterine gestation or the possibility of an ectopic pregnancy. Therefore, a follow-up pelvic/Ob ultrasound is recommended in 2 weeks as well as serial quantitative beta HCG determinations. ____________________________________________  FINAL ASSESSMENT AND PLAN  Threatened miscarriage  Plan: Patient with labs and imaging as dictated above. Patient is advised to follow-up with her OB/GYN doctor in 2 days for repeat Quant and examination. She is stable for discharge at this time.   Earleen Newport, MD   Note: This note was generated in part or whole with voice recognition software. Voice recognition is usually quite accurate but there are transcription errors that can and very often do occur. I apologize for any typographical errors that were not detected and corrected.     Earleen Newport, MD 08/23/16 [phone]

## 2016-08-24 LAB — POCT PREGNANCY, URINE: PREG TEST UR: POSITIVE — AB

## 2016-08-26 ENCOUNTER — Ambulatory Visit: Payer: Self-pay | Admitting: Obstetrics & Gynecology

## 2016-08-26 ENCOUNTER — Encounter: Payer: Self-pay | Admitting: Obstetrics & Gynecology

## 2016-08-26 ENCOUNTER — Ambulatory Visit (INDEPENDENT_AMBULATORY_CARE_PROVIDER_SITE_OTHER): Payer: Medicaid Other | Admitting: Obstetrics & Gynecology

## 2016-08-26 VITALS — BP 100/60 | HR 83 | Wt 83.0 lb

## 2016-08-26 DIAGNOSIS — Z3490 Encounter for supervision of normal pregnancy, unspecified, unspecified trimester: Secondary | ICD-10-CM | POA: Diagnosis not present

## 2016-08-26 DIAGNOSIS — Z3A01 Less than 8 weeks gestation of pregnancy: Secondary | ICD-10-CM

## 2016-08-26 LAB — POCT URINE PREGNANCY: Preg Test, Ur: POSITIVE — AB

## 2016-08-26 NOTE — Progress Notes (Signed)
08/26/2016   Chief Complaint: Missed period  Transfer of Care Patient: no  History of Present Illness: Michele Alexander is a 21 y.o. G3P1001 [redacted]w[redacted]d based on Patient's last menstrual period was 07/29/2016 (approximate). with an Estimated Date of Delivery: 05/05/17, with the above CC. Preg complicated by has Nausea & vomiting; History of Hodgkin's lymphoma; and Sickle cell trait ([place]) on her problem list..  Some mild LLQ T; prior miscarriage; so was seen in ER 3 days ago w beta HCG 650 and normal [place] of adnexa.  Her periods were: regular periods every 28 days She was using no method, (prior patch) when she conceived.  She has Positive signs or symptoms of nausea/vomiting of pregnancy. She has Negative signs or symptoms of miscarriage or preterm labor She identifies Negative Zika risk factors for her and her partner On any different medications around the time she conceived/early pregnancy: No  History of varicella: Yes   ROS: A 12-point review of systems was performed and negative, except as stated in the above HPI.  OBGYN History: As per HPI. OB History  Gravida Para Term Preterm AB Living  3 1 1  0 0 1  SAB TAB Ectopic Multiple Live Births  0 0 0 0 1    # Outcome Date GA Lbr Len/2nd Weight Sex Delivery Anes PTL Lv  3 Current           2 Term 04/23/15 [redacted]w[redacted]d 35:30 / 00:37 7 lb 7.2 oz (3.38 kg) F Vag-Spont EPI  LIV  1 Gravida               Any issues with any prior pregnancies: yes Any prior children are healthy, doing well, without any problems or issues: yes History of pap smears: No. too young History of STIs: No   Past Medical History: Past Medical History:  Diagnosis Date  . Anemia   . Anxiety   . Cancer ([place])    Hodgkin's Lymphoma  . Depression   . Hodgkin's disease(201)     Past Surgical History: Past Surgical History:  Procedure Laterality Date  . DILATION AND EVACUATION N/A 12/21/2015   Procedure: DILATATION AND EVACUATION;  Surgeon: Will Bonnet, MD;  Location:  [facility] ORS;  Service: Gynecology;  Laterality: N/A;  . NECK LESION BIOPSY    . OTHER SURGICAL HISTORY N/A 2015   Endoscopy  . PORT-A-CATH REMOVAL    . PORTACATH PLACEMENT Left 2010    Family History:  Family History  Problem Relation Age of Onset  . Hypertension Mother   . Diabetes Maternal Grandfather   . Hypertension Maternal Grandfather   . Cancer Maternal Grandmother   . Cancer Paternal Grandfather    She denies any female cancers, bleeding or blood clotting disorders.  She denies any history of mental retardation, birth defects or genetic disorders in her or the FOB's history  Social History:  Social History   Social History  . Marital status: Married    Spouse name: N/A  . Number of children: N/A  . Years of education: N/A   Occupational History  . Not on file.   Social History Main Topics  . Smoking status: Never Smoker  . Smokeless tobacco: Never Used  . Alcohol use No  . Drug use: No  . Sexual activity: Yes    Birth control/ protection: None   Other Topics Concern  . Not on file   Social History Narrative  . No narrative on file   Any pets in the household: no  Allergy: Allergies  Allergen Reactions  . Other Hives    Oral CT contrast Oral CT contrast  . Nexium [Esomeprazole Magnesium] Swelling  . Cheese Rash and Swelling    Lip swelling    Current Outpatient Medications: No current outpatient prescriptions on file.   Physical Exam:   BP 100/60   Pulse 83   Wt 83 lb (37.6 kg)   LMP 07/29/2016 (Approximate)   BMI 17.96 kg/m  Body mass index is 17.96 kg/m. Fundal height: not applicable FHTs: none yet  Constitutional: Well nourished, well developed female in no acute distress.  Neck:  Supple, normal appearance, and no thyromegaly  Cardiovascular: S1, S2 normal, no murmur, rub or gallop, regular rate and rhythm Respiratory:  Clear to auscultation bilateral. Normal respiratory effort Abdomen: positive bowel sounds and no masses,  hernias; diffusely non tender to palpation, non distended Breasts: breasts appear normal, no suspicious masses, no skin or nipple changes or axillary nodes. Neuro/Psych:  Normal mood and affect.  Skin:  Warm and dry.  Lymphatic:  No inguinal lymphadenopathy.   Pelvic exam: is not limited by body habitus EGBUS: within normal limits, Vagina: within normal limits and with no blood in the vault, Cervix: normal appearing cervix without discharge or lesions, closed/long/high, Uterus:  enlarged: slightly, and Adnexa:  no mass, fullness, tenderness  Assessment: Michele Alexander is a 21 y.o. G3P1001 [redacted]w[redacted]d based on Patient's last menstrual period was 07/29/2016 (approximate). with an Estimated Date of Delivery: 05/05/17,  for prenatal care.  Plan:  No problem-specific Assessment & Plan notes found for this encounter.  1. [redacted] weeks gestation of pregnancy - US OB Comp Less 14 Wks; Future - Urine culture - GC/Chlamydia Probe Amp - Prenatal Panel - Beta HCG, Quant  2. Encounter for supervision of low-risk pregnancy, antepartum  - US OB Comp Less 14 Wks; Future - Urine culture - GC/Chlamydia Probe Amp - Prenatal Panel - Beta HCG, Quant  3. Prior h/o miscarriage; risks explained.  Beta and future [place] to reassure and assess early pregnancy.  Monitor LLQ tenderness (mild) and nausea.  4. h/o sickle cell trait.  Plan Urine C&S per trimester.  5. h/o Hodgins Lymphoma, remission x 8 years.  The patient has not traveled to a [place] endemic area within the past 6 months, nor has she had unprotected sex with a partner who has travelled to a [place] endemic region within the past 6 months. The patient has been advised to notify us if these factors change any time during this current pregnancy, so adequate testing and monitoring can be initiated.  Problem list reviewed and updated.  Follow up in 4 weeks. Barnett Applebaum, MD, [facility], [facility] 08/26/2016  8:56 AM

## 2016-08-26 NOTE — Addendum Note (Signed)
Addended by: Quintella Baton D on: 08/26/2016 09:36 AM   Modules accepted: Orders

## 2016-08-26 NOTE — Patient Instructions (Signed)
Commonly Asked Questions During Pregnancy  Cats: A parasite can be excreted in cat feces.  To avoid exposure you need to have another person empty the little box.  If you must empty the litter box you will need to wear gloves.  Wash your hands after handling your cat.  This parasite can also be found in raw or undercooked meat so this should also be avoided.  Colds, Sore Throats, Flu: Please check your medication sheet to see what you can take for symptoms.  If your symptoms are unrelieved by these medications please call the office.  Dental Work: Most any dental work your dentist recommends is permitted.  X-rays should only be taken during the first trimester if absolutely necessary.  Your abdomen should be shielded with a lead apron during all x-rays.  Please notify your provider prior to receiving any x-rays.  Novocaine is fine; gas is not recommended.  If your dentist requires a note from us prior to dental work please call the office and we will provide one for you.  Exercise: Exercise is an important part of staying healthy during your pregnancy.  You may continue most exercises you were accustomed to prior to pregnancy.  Later in your pregnancy you will most likely notice you have difficulty with activities requiring balance like riding a bicycle.  It is important that you listen to your body and avoid activities that put you at a higher risk of falling.  Adequate rest and staying well hydrated are a must!  If you have questions about the safety of specific activities ask your provider.    Exposure to Children with illness: Try to avoid obvious exposure; report any symptoms to us when noted,  If you have chicken pos, red measles or mumps, you should be immune to these diseases.   Please do not take any vaccines while pregnant unless you have checked with your OB provider.  Fetal Movement: After 28 weeks we recommend you do "kick counts" twice daily.  Lie or sit down in a calm quiet environment and  count your baby movements "kicks".  You should feel your baby at least 10 times per hour.  If you have not felt 10 kicks within the first hour get up, walk around and have something sweet to eat or drink then repeat for an additional hour.  If count remains less than 10 per hour notify your provider.  Fumigating: Follow your pest control agent's advice as to how long to stay out of your home.  Ventilate the area well before re-entering.  Hemorrhoids:   Most over-the-counter preparations can be used during pregnancy.  Check your medication to see what is safe to use.  It is important to use a stool softener or fiber in your diet and to drink lots of liquids.  If hemorrhoids seem to be getting worse please call the office.   Hot Tubs:  Hot tubs Jacuzzis and saunas are not recommended while pregnant.  These increase your internal body temperature and should be avoided.  Intercourse:  Sexual intercourse is safe during pregnancy as long as you are comfortable, unless otherwise advised by your provider.  Spotting may occur after intercourse; report any bright red bleeding that is heavier than spotting.  Labor:  If you know that you are in labor, please go to the hospital.  If you are unsure, please call the office and let us help you decide what to do.  Lifting, straining, etc:  If your job requires heavy   lifting or straining please check with your provider for any limitations.  Generally, you should not lift items heavier than that you can lift simply with your hands and arms (no back muscles)  Painting:  Paint fumes do not harm your pregnancy, but may make you ill and should be avoided if possible.  Latex or water based paints have less odor than oils.  Use adequate ventilation while painting.  Permanents & Hair Color:  Chemicals in hair dyes are not recommended as they cause increase hair dryness which can increase hair loss during pregnancy.  " Highlighting" and permanents are allowed.  Dye may be  absorbed differently and permanents may not hold as well during pregnancy.  Sunbathing:  Use a sunscreen, as skin burns easily during pregnancy.  Drink plenty of fluids; avoid over heating.  Tanning Beds:  Because their possible side effects are still unknown, tanning beds are not recommended.  Ultrasound Scans:  Routine ultrasounds are performed at approximately 20 weeks.  You will be able to see your baby's general anatomy an if you would like to know the gender this can usually be determined as well.  If it is questionable when you conceived you may also receive an ultrasound early in your pregnancy for dating purposes.  Otherwise ultrasound exams are not routinely performed unless there is a medical necessity.  Although you can request a scan we ask that you pay for it when conducted because insurance does not cover " patient request" scans.  Work: If your pregnancy proceeds without complications you may work until your due date, unless your physician or employer advises otherwise.  Round Ligament Pain/Pelvic Discomfort:  Sharp, shooting pains not associated with bleeding are fairly common, usually occurring in the second trimester of pregnancy.  They tend to be worse when standing up or when you remain standing for long periods of time.  These are the result of pressure of certain pelvic ligaments called "round ligaments".  Rest, Tylenol and heat seem to be the most effective relief.  As the womb and fetus grow, they rise out of the pelvis and the discomfort improves.  Please notify the office if your pain seems different than that described.  It may represent a more serious condition.  First Trimester of Pregnancy The first trimester of pregnancy is from week 1 until the end of week 13 (months 1 through 3). A week after a sperm fertilizes an egg, the egg will implant on the wall of the uterus. This embryo will begin to develop into a baby. Genes from you and your partner will form the baby. The  female genes will determine whether the baby will be a boy or a girl. At 6-8 weeks, the eyes and face will be formed, and the heartbeat can be seen on ultrasound. At the end of 12 weeks, all the baby's organs will be formed. Now that you are pregnant, you will want to do everything you can to have a healthy baby. Two of the most important things are to get good prenatal care and to follow your health care provider's instructions. Prenatal care is all the medical care you receive before the baby's birth. This care will help prevent, find, and treat any problems during the pregnancy and childbirth. Body changes during your first trimester Your body goes through many changes during pregnancy. The changes vary from woman to woman.  You may gain or lose a couple of pounds at first.  You may feel sick to your stomach (  nauseous) and you may throw up (vomit). If the vomiting is uncontrollable, call your health care provider.  You may tire easily.  You may develop headaches that can be relieved by medicines. All medicines should be approved by your health care provider.  You may urinate more often. Painful urination may mean you have a bladder infection.  You may develop heartburn as a result of your pregnancy.  You may develop constipation because certain hormones are causing the muscles that push stool through your intestines to slow down.  You may develop hemorrhoids or swollen veins (varicose veins).  Your breasts may begin to grow larger and become tender. Your nipples may stick out more, and the tissue that surrounds them (areola) may become darker.  Your gums may bleed and may be sensitive to brushing and flossing.  Dark spots or blotches (chloasma, mask of pregnancy) may develop on your face. This will likely fade after the baby is born.  Your menstrual periods will stop.  You may have a loss of appetite.  You may develop cravings for certain kinds of food.  You may have changes in your  emotions from day to day, such as being excited to be pregnant or being concerned that something may go wrong with the pregnancy and baby.  You may have more vivid and strange dreams.  You may have changes in your hair. These can include thickening of your hair, rapid growth, and changes in texture. Some women also have hair loss during or after pregnancy, or hair that feels dry or thin. Your hair will most likely return to normal after your baby is born. What to expect at prenatal visits During a routine prenatal visit:  You will be weighed to make sure you and the baby are growing normally.  Your blood pressure will be taken.  Your abdomen will be measured to track your baby's growth.  The fetal heartbeat will be listened to between weeks 10 and 14 of your pregnancy.  Test results from any previous visits will be discussed. Your health care provider may ask you:  How you are feeling.  If you are feeling the baby move.  If you have had any abnormal symptoms, such as leaking fluid, bleeding, severe headaches, or abdominal cramping.  If you are using any tobacco products, including cigarettes, chewing tobacco, and electronic cigarettes.  If you have any questions. Other tests that may be performed during your first trimester include:  Blood tests to find your blood type and to check for the presence of any previous infections. The tests will also be used to check for low iron levels (anemia) and protein on red blood cells (Rh antibodies). Depending on your risk factors, or if you previously had diabetes during pregnancy, you may have tests to check for high blood sugar that affects pregnant women (gestational diabetes).  Urine tests to check for infections, diabetes, or protein in the urine.  An ultrasound to confirm the proper growth and development of the baby.  Fetal screens for spinal cord problems (spina bifida) and Down syndrome.  HIV (human immunodeficiency virus) testing.  Routine prenatal testing includes screening for HIV, unless you choose not to have this test.  You may need other tests to make sure you and the baby are doing well. Follow these instructions at home: Medicines   Follow your health care provider's instructions regarding medicine use. Specific medicines may be either safe or unsafe to take during pregnancy.  Take a prenatal vitamin that contains  at least 600 micrograms (mcg) of folic acid.  If you develop constipation, try taking a stool softener if your health care provider approves. Eating and drinking   Eat a balanced diet that includes fresh fruits and vegetables, whole grains, good sources of protein such as meat, eggs, or tofu, and low-fat dairy. Your health care provider will help you determine the amount of weight gain that is right for you.  Avoid raw meat and uncooked cheese. These carry germs that can cause birth defects in the baby.  Eating four or five small meals rather than three large meals a day may help relieve nausea and vomiting. If you start to feel nauseous, eating a few soda crackers can be helpful. Drinking liquids between meals, instead of during meals, also seems to help ease nausea and vomiting.  Limit foods that are high in fat and processed sugars, such as fried and sweet foods.  To prevent constipation:  Eat foods that are high in fiber, such as fresh fruits and vegetables, whole grains, and beans.  Drink enough fluid to keep your urine clear or pale yellow. Activity   Exercise only as directed by your health care provider. Most women can continue their usual exercise routine during pregnancy. Try to exercise for 30 minutes at least 5 days a week. Exercising will help you:  Control your weight.  Stay in shape.  Be prepared for labor and delivery.  Experiencing pain or cramping in the lower abdomen or lower back is a good sign that you should stop exercising. Check with your health care provider before  continuing with normal exercises.  Try to avoid standing for long periods of time. Move your legs often if you must stand in one place for a long time.  Avoid heavy lifting.  Wear low-heeled shoes and practice good posture.  You may continue to have sex unless your health care provider tells you not to. Relieving pain and discomfort   Wear a good support bra to relieve breast tenderness.  Take warm sitz baths to soothe any pain or discomfort caused by hemorrhoids. Use hemorrhoid cream if your health care provider approves.  Rest with your legs elevated if you have leg cramps or low back pain.  If you develop varicose veins in your legs, wear support hose. Elevate your feet for 15 minutes, 3-4 times a day. Limit salt in your diet. Prenatal care   Schedule your prenatal visits by the twelfth week of pregnancy. They are usually scheduled monthly at first, then more often in the last 2 months before delivery.  Write down your questions. Take them to your prenatal visits.  Keep all your prenatal visits as told by your health care provider. This is important. Safety   Wear your seat belt at all times when driving.  Make a list of emergency phone numbers, including numbers for family, friends, the hospital, and police and fire departments. General instructions   Ask your health care provider for a referral to a local prenatal education class. Begin classes no later than the beginning of month 6 of your pregnancy.  Ask for help if you have counseling or nutritional needs during pregnancy. Your health care provider can offer advice or refer you to specialists for help with various needs.  Do not use hot tubs, steam rooms, or saunas.  Do not douche or use tampons or scented sanitary pads.  Do not cross your legs for long periods of time.  Avoid cat litter boxes and soil  used by cats. These carry germs that can cause birth defects in the baby and possibly loss of the fetus by  miscarriage or stillbirth.  Avoid all smoking, herbs, alcohol, and medicines not prescribed by your health care provider. Chemicals in these products affect the formation and growth of the baby.  Do not use any products that contain nicotine or tobacco, such as cigarettes and e-cigarettes. If you need help quitting, ask your health care provider. You may receive counseling support and other resources to help you quit.  Schedule a dentist appointment. At home, brush your teeth with a soft toothbrush and be gentle when you floss. Contact a health care provider if:  You have dizziness.  You have mild pelvic cramps, pelvic pressure, or nagging pain in the abdominal area.  You have persistent nausea, vomiting, or diarrhea.  You have a bad smelling vaginal discharge.  You have pain when you urinate.  You notice increased swelling in your face, hands, legs, or ankles.  You are exposed to fifth disease or chickenpox.  You are exposed to [place] measles (rubella) and have never had it. Get help right away if:  You have a fever.  You are leaking fluid from your vagina.  You have spotting or bleeding from your vagina.  You have severe abdominal cramping or pain.  You have rapid weight gain or loss.  You vomit blood or material that looks like coffee grounds.  You develop a severe headache.  You have shortness of breath.  You have any kind of trauma, such as from a fall or a car accident. Summary  The first trimester of pregnancy is from week 1 until the end of week 13 (months 1 through 3).  Your body goes through many changes during pregnancy. The changes vary from woman to woman.  You will have routine prenatal visits. During those visits, your health care provider will examine you, discuss any test results you may have, and talk with you about how you are feeling. This information is not intended to replace advice given to you by your health care provider. Make sure you  discuss any questions you have with your health care provider. Document Released: 05/20/2001 Document Revised: 05/07/2016 Document Reviewed: 05/07/2016 Elsevier Interactive Patient Education  2017 [employer].

## 2016-08-27 LAB — RPR+RH+ABO+RUB AB+AB SCR+CB...
ANTIBODY SCREEN: NEGATIVE
HEMATOCRIT: 36.5 % (ref 34.0–46.6)
HIV Screen 4th Generation wRfx: NONREACTIVE
Hemoglobin: 11.9 g/dL (ref 11.1–15.9)
Hepatitis B Surface Ag: NEGATIVE
MCH: 25.1 pg — AB (ref 26.6–33.0)
MCHC: 32.6 g/dL (ref 31.5–35.7)
MCV: 77 fL — AB (ref 79–97)
PLATELETS: 398 10*3/uL — AB (ref 150–379)
RBC: 4.75 x10E6/uL (ref 3.77–5.28)
RDW: 15.7 % — ABNORMAL HIGH (ref 12.3–15.4)
RH TYPE: POSITIVE
RPR: NONREACTIVE
RUBELLA: 1.06 {index} (ref >0.99)
Varicella zoster IgG: <135 index — ABNORMAL LOW (ref >165)
WBC: 4.1 10*3/uL (ref 3.4–10.8)

## 2016-08-27 LAB — BETA HCG QUANT (REF LAB): hCG Quant: 1960 m[IU]/mL

## 2016-08-28 LAB — GC/CHLAMYDIA PROBE AMP
Chlamydia trachomatis, NAA: NEGATIVE
NEISSERIA GONORRHOEAE BY PCR: NEGATIVE

## 2016-08-28 LAB — URINE CULTURE: Organism ID, Bacteria: NO GROWTH

## 2016-08-29 ENCOUNTER — Telehealth: Payer: Self-pay

## 2016-08-29 NOTE — Telephone Encounter (Signed)
LM 01/29/17 at 1045, she will call back if she wants details (numbers increased normally for pregnancy)

## 2016-08-29 NOTE — Telephone Encounter (Signed)
Pt calling for bloodwork results on a quant.  please call [phone].

## 2016-08-29 NOTE — Telephone Encounter (Signed)
PT AWARE OF RESULTS 

## 2016-09-23 ENCOUNTER — Ambulatory Visit (INDEPENDENT_AMBULATORY_CARE_PROVIDER_SITE_OTHER): Payer: Medicaid Other | Admitting: Obstetrics and Gynecology

## 2016-09-23 ENCOUNTER — Ambulatory Visit (INDEPENDENT_AMBULATORY_CARE_PROVIDER_SITE_OTHER): Payer: Medicaid Other

## 2016-09-23 VITALS — BP 102/64 | Wt 88.0 lb

## 2016-09-23 DIAGNOSIS — Z3687 Encounter for antenatal screening for uncertain dates: Secondary | ICD-10-CM

## 2016-09-23 DIAGNOSIS — Z3A01 Less than 8 weeks gestation of pregnancy: Secondary | ICD-10-CM

## 2016-09-23 DIAGNOSIS — O0991 Supervision of high risk pregnancy, unspecified, first trimester: Secondary | ICD-10-CM

## 2016-09-23 DIAGNOSIS — Z8571 Personal history of Hodgkin lymphoma: Secondary | ICD-10-CM

## 2016-09-23 DIAGNOSIS — Z3490 Encounter for supervision of normal pregnancy, unspecified, unspecified trimester: Secondary | ICD-10-CM

## 2016-09-23 NOTE — Progress Notes (Signed)
Dating scan today. u/s showed IUP with no cardiac activity, possibly too small.  Repeat NO EARLIER THAN 11 days

## 2016-10-03 ENCOUNTER — Encounter: Payer: Self-pay | Admitting: Emergency Medicine

## 2016-10-03 DIAGNOSIS — Z8571 Personal history of Hodgkin lymphoma: Secondary | ICD-10-CM | POA: Insufficient documentation

## 2016-10-03 DIAGNOSIS — O209 Hemorrhage in early pregnancy, unspecified: Secondary | ICD-10-CM | POA: Diagnosis present

## 2016-10-03 DIAGNOSIS — O200 Threatened abortion: Secondary | ICD-10-CM | POA: Diagnosis not present

## 2016-10-03 DIAGNOSIS — Z3A01 Less than 8 weeks gestation of pregnancy: Secondary | ICD-10-CM | POA: Insufficient documentation

## 2016-10-03 LAB — POCT PREGNANCY, URINE: Preg Test, Ur: POSITIVE — AB

## 2016-10-03 NOTE — ED Triage Notes (Signed)
Pt presents to ED with vaginal bleeding and lower abd cramping around 1930. Currently [redacted] weeks pregnant. Pt states she thinks she may be having a miscarriage. Hx of the same. Bleeding is brown in color and pt denies passing any clots.

## 2016-10-04 ENCOUNTER — Emergency Department
Admission: EM | Admit: 2016-10-04 | Discharge: 2016-10-04 | Disposition: A | Discharge status: Home / Self Care | Payer: Medicaid Other | Attending: Emergency Medicine | Admitting: Emergency Medicine

## 2016-10-04 ENCOUNTER — Emergency Department: Payer: Medicaid Other

## 2016-10-04 DIAGNOSIS — O4690 Antepartum hemorrhage, unspecified, unspecified trimester: Secondary | ICD-10-CM

## 2016-10-04 DIAGNOSIS — O200 Threatened abortion: Secondary | ICD-10-CM

## 2016-10-04 LAB — HCG, QUANTITATIVE, PREGNANCY: hCG, Beta Chain, Quant, S: 38362 m[IU]/mL — ABNORMAL HIGH (ref <5)

## 2016-10-04 NOTE — ED Notes (Signed)
Report to Ashley, RN

## 2016-10-04 NOTE — ED Notes (Addendum)
Pt states last intercourse approx 2 months ago. Hx miscarriage.

## 2016-10-04 NOTE — Discharge Instructions (Addendum)
Please call [facility] to arrange follow-up on Monday. Please return for heavier bleeding cramping or feeling weak or dizzy. Please return especially if you're saturating a pad an hour.

## 2016-10-04 NOTE — ED Notes (Addendum)
Pt in [place] at this time. Family in room denies needs.

## 2016-10-04 NOTE — ED Notes (Signed)
Pt returned from US at this time.

## 2016-10-04 NOTE — ED Provider Notes (Signed)
[facility] Emergency Department Provider Note   ____________________________________________   First MD Initiated Contact with Patient 10/04/16 0128     (approximate)  I have reviewed the triage vital signs and the nursing notes.   HISTORY  Chief Complaint Vaginal Bleeding and Abdominal Cramping   HPI Michele Alexander is a 21 y.o. female who is about [redacted] weeks pregnant. She's been having crampy abdominal pain for some time. Earlier ultrasound showed a intrauterine pregnancy. She had some bleeding earlier today with increased cramping no bleeding at present. She still has a little bit of brown vaginal discharge. She had a history of 1 previous miscarriage.   Past Medical History:  Diagnosis Date  . Anemia   . Anxiety   . Cancer ([place])    Hodgkin's Lymphoma  . Depression   . Hodgkin's disease(201)     Patient Active Problem List   Diagnosis Date Noted  . Pregnancy, supervision, high-risk, first trimester 04/23/2015  . History of Hodgkin's lymphoma 04/23/2015  . Sickle cell trait ([place]) 04/23/2015  . Nausea & vomiting 01/14/2015    Past Surgical History:  Procedure Laterality Date  . DILATION AND EVACUATION N/A 12/21/2015   Procedure: DILATATION AND EVACUATION;  Surgeon: Will Bonnet, MD;  Location: [facility] ORS;  Service: Gynecology;  Laterality: N/A;  . NECK LESION BIOPSY    . OTHER SURGICAL HISTORY N/A 2015   Endoscopy  . PORT-A-CATH REMOVAL    . PORTACATH PLACEMENT Left 2010    Prior to Admission medications   Medication Sig Start Date End Date Taking? Authorizing Provider  Prenatal Vit-Fe Fumarate-FA (PRENATAL VITAMIN PO) Take by mouth.    Historical Provider, MD    Allergies Other; Nexium [esomeprazole magnesium]; and Cheese  Family History  Problem Relation Age of Onset  . Hypertension Mother   . Diabetes Maternal Grandfather   . Hypertension Maternal Grandfather   . Cancer Maternal Grandmother   . Cancer Paternal  Grandfather     Social History Social History  Substance Use Topics  . Smoking status: Never Smoker  . Smokeless tobacco: Never Used  . Alcohol use No    Review of Systems  Constitutional: No fever/chills Eyes: No visual changes. ENT: No sore throat. Cardiovascular: Denies chest pain. Respiratory: Denies shortness of breath. Gastrointestinal: See history of present illness Genitourinary: Negative for dysuria. Musculoskeletal: Negative for back pain. Skin: Negative for rash. Neurological: Negative for headaches, focal weakness or numbness.   ____________________________________________   PHYSICAL EXAM:  VITAL SIGNS: ED Triage Vitals  Enc Vitals Group     BP 10/03/16 2312 116/77     Pulse Rate 10/03/16 2312 74     Resp 10/03/16 2312 18     Temp 10/03/16 2312 98.7 F (37.1 C)     Temp Source 10/03/16 2312 Oral     SpO2 10/03/16 2312 100 %     Weight 10/03/16 2313 88 lb (39.9 kg)     Height 10/03/16 2313 4\' 9"  (1.448 m)     Head Circumference --      Peak Flow --      Pain Score 10/03/16 2312 8     Pain Loc --      Pain Edu? --      Excl. in [place]? --     Constitutional: Alert and oriented. Well appearing and in no acute distress. Eyes: Conjunctivae are normal. PERRL. EOMI. Head: Atraumatic. Nose: No congestion/rhinnorhea. Mouth/Throat: Mucous membranes are moist.  Oropharynx non-erythematous. Neck: No stridor.  Cardiovascular: Normal rate, regular rhythm. Grossly normal heart sounds.  Good peripheral circulation. Respiratory: Normal respiratory effort.  No retractions. Lungs CTAB. Gastrointestinal: Soft Some lower abdominal tenderness to palpation No distention. No abdominal bruits. No CVA tenderness. Genitourinary: Normal perineum slight brown discharge and vagina minimal tenderness diffusely no masses. Musculoskeletal: No lower extremity tenderness nor edema.  No joint effusions. Neurologic:  Normal speech and language. No gross focal neurologic deficits are  appreciated. No gait instability. Skin:  Skin is warm, dry and intact. No rash noted. Psychiatric: Mood and affect are normal. Speech and behavior are normal.  ____________________________________________   LABS (all labs ordered are listed, but only abnormal results are displayed)  Labs Reviewed  HCG, QUANTITATIVE, PREGNANCY - Abnormal; Notable for the following:       Result Value   hCG, Beta Chain, Quant, S [id number] (*)    All other components within normal limits  POCT PREGNANCY, URINE - Abnormal; Notable for the following:    Preg Test, Ur POSITIVE (*)    All other components within normal limits  POC URINE PREG, ED   ____________________________________________  EKG   ____________________________________________  RADIOLOGY  Study Result   CLINICAL DATA:  Vaginal bleeding and cramping for 9 hours. Gestational age by last menstrual period 9 weeks and 4 days. Beta HCG [id number].  EXAM: OBSTETRIC <14 WK [place] AND TRANSVAGINAL OB US  TECHNIQUE: Both transabdominal and transvaginal ultrasound examinations were performed for complete evaluation of the gestation as well as the maternal uterus, adnexal regions, and pelvic cul-de-sac. Transvaginal technique was performed to assess early pregnancy.  COMPARISON:  Obstetric ultrasound September 23, 2016 and Pelvic ultrasound August 23, 2016  FINDINGS: Intrauterine gestational sac: Present  Yolk sac:  Present, enlarged at 16 mm.  Embryo:  Present  Cardiac Activity: Not present  CRL:  5 mm  mm   6 w   1 d  Subchorionic hemorrhage:  Trace subchorionic hemorrhage.  Maternal uterus/adnexae: Similar RIGHT corpus luteal cyst.  IMPRESSION: No interval growth of fetal pole and no cardiac activity highly suspicious for failed pregnancy.   Electronically Signed   By: Elon Alas M.D.   On: 10/04/2016 05:22    ____________________________________________   PROCEDURES  Procedure(s)  performed  Procedures  Critical Care performed:  ____________________________________________   INITIAL IMPRESSION / ASSESSMENT AND PLAN / ED COURSE  Pertinent labs & imaging results that were available during my care of the patient were reviewed by me and considered in my medical decision making (see chart for details).   Discussed with Dr. Marina Goodell side they will follow-up. Patient aware of likelihood of losing the pregnancy     ____________________________________________   FINAL CLINICAL IMPRESSION(S) / ED DIAGNOSES  Final diagnoses:  Threatened miscarriage      NEW MEDICATIONS STARTED DURING THIS VISIT:  New Prescriptions   No medications on file     Note:  This document was prepared using Dragon voice recognition software and may include unintentional dictation errors.    Nena Polio, MD 10/04/16 [phone]

## 2016-10-09 ENCOUNTER — Ambulatory Visit (INDEPENDENT_AMBULATORY_CARE_PROVIDER_SITE_OTHER): Payer: Medicaid Other | Admitting: Advanced Practice Midwife

## 2016-10-09 ENCOUNTER — Ambulatory Visit (INDEPENDENT_AMBULATORY_CARE_PROVIDER_SITE_OTHER): Payer: Medicaid Other

## 2016-10-09 VITALS — BP 104/60 | Wt 85.0 lb

## 2016-10-09 DIAGNOSIS — O0991 Supervision of high risk pregnancy, unspecified, first trimester: Secondary | ICD-10-CM

## 2016-10-09 DIAGNOSIS — O200 Threatened abortion: Secondary | ICD-10-CM

## 2016-10-09 NOTE — Patient Instructions (Signed)

## 2016-10-09 NOTE — Progress Notes (Signed)
Viability scan today. No increase in growth of fetus since the u/s at the hospital over the weekend, and no fetal heart tones today. No bleeding since Sunday. Patient states that her cramping has continued and gotten worse since then. She says this seems similar to when she had miscarriage last year. Discussed the possibility of non-viable pregnancy and poc. Dr Glennon Mac to review U/S as well. Beta hcg today. I will f/u with patient by telephone call after discussing with Dr Glennon Mac.

## 2016-10-09 NOTE — Progress Notes (Signed)
Pt went to ER Friday night for bleeding and pain. Last time pt had bleeding was Sunday.

## 2016-10-10 ENCOUNTER — Telehealth: Payer: Self-pay

## 2016-10-10 DIAGNOSIS — O021 Missed abortion: Secondary | ICD-10-CM

## 2016-10-10 LAB — BETA HCG QUANT (REF LAB): hCG Quant: 15325 m[IU]/mL

## 2016-10-10 NOTE — Telephone Encounter (Signed)
Pt calling to see if SDJ got in touch c JEG.  Pt hasn't heard anything.  Was told she would be called after JEG spoke c SDJ.  [phone].

## 2016-10-13 MED ORDER — MISOPROSTOL 200 MCG PO TABS
ORAL_TABLET | ORAL | 0 refills | Status: DC
Start: 2016-10-13 — End: 2017-04-05

## 2016-10-13 NOTE — Telephone Encounter (Signed)
Spoke with patient and suggested she take ibuprofen 400 mg every 6 hours PRN. Also sent Rx to pharm for cytotec for missed AB. She had bleeding on Saturday and Sunday. She denied heavy bleeding or clots but still has significant pain. Patient chooses to start with medication and then may proceed to [facility] as needed.

## 2016-10-15 ENCOUNTER — Encounter: Payer: Self-pay | Admitting: Obstetrics & Gynecology

## 2016-10-15 ENCOUNTER — Ambulatory Visit (INDEPENDENT_AMBULATORY_CARE_PROVIDER_SITE_OTHER): Payer: Medicaid Other | Admitting: Obstetrics & Gynecology

## 2016-10-15 ENCOUNTER — Telehealth: Payer: Self-pay | Admitting: Obstetrics & Gynecology

## 2016-10-15 VITALS — BP 80/60 | HR 83 | Ht <= 58 in | Wt 85.0 lb

## 2016-10-15 DIAGNOSIS — O021 Missed abortion: Secondary | ICD-10-CM | POA: Diagnosis not present

## 2016-10-15 NOTE — Progress Notes (Signed)
  Obstetric Problem Visit   Chief Complaint:  Chief Complaint  Patient presents with  . Miscarriage    here to check HCG levels    History of Present Illness: Patient is a 21 y.o. G3P1001 [redacted]w[redacted]d presenting for first trimester bleeding.  The bleeding was tyreated after dx made of Missed Ab with [employer]to[employer] on Mon and Tues, with immediate heavy bleeding, passage of tissue, nausea and diarrhea side effects, and dizziness.  Seen in ER with confirmation of miscarriage, and hCG drop to 3700 noted.  Feels better today, still some bleeding.  PMHx: She  has a past medical history of Anemia; Anxiety; Cancer ([place]); Depression; and Hodgkin's disease(201). Also,  has a past surgical history that includes Neck lesion biopsy; Portacath placement (Left, 2010); Other surgical history (N/A, 2015); Port-a-cath removal; and Dilation and evacuation (N/A, 12/21/2015)., family history includes Cancer in her maternal grandmother and paternal grandfather; Diabetes in her maternal grandfather; Hypertension in her maternal grandfather and mother.,  reports that she has never smoked. She has never used smokeless tobacco. She reports that she does not drink alcohol or use drugs.  She has a current medication list which includes the following prescription(s): hydrocodone-acetaminophen, promethazine, misoprostol, and prenatal vit-fe fumarate-fa. Also, is allergic to other; nexium [esomeprazole magnesium]; and cheese.  Review of Systems  Constitutional: Negative for chills, fever and malaise/fatigue.  HENT: Negative for congestion, sinus pain and sore throat.   Eyes: Negative for blurred vision and pain.  Respiratory: Negative for cough and wheezing.   Cardiovascular: Negative for chest pain and leg swelling.  Gastrointestinal: Negative for abdominal pain, constipation, diarrhea, heartburn, nausea and vomiting.  Genitourinary: Negative for dysuria, frequency, hematuria and urgency.  Musculoskeletal: Negative for back pain, joint  pain, myalgias and neck pain.  Skin: Negative for itching and rash.  Neurological: Negative for dizziness, tremors and weakness.  Endo/Heme/Allergies: Does not bruise/bleed easily.  Psychiatric/Behavioral: Negative for depression. The patient is not nervous/anxious and does not have insomnia.     Objective: Vitals:   10/15/16 1504  BP: (!) 80/60  Pulse: 83   Physical Exam  Constitutional: She is oriented to person, place, and time. She appears well-developed and well-nourished. No distress.  Musculoskeletal: Normal range of motion.  Neurological: She is alert and oriented to person, place, and time.  Skin: Skin is warm and dry.  Psychiatric: She has a normal mood and affect.  Vitals reviewed.   Assessment: 21 y.o. G3P1001 [redacted]w[redacted]d 1. Abortion, missed - Beta HCG, Quant - Hemoglobin - Monitor for resolution of bleeding -BC counseled, considering Depo or patch   Plan: Problem List Items Addressed This Visit      Other   RESOLVED: Abortion, missed - Primary   Relevant Orders   Beta HCG, Quant   Hemoglobin     Routine bleeding precautions were discussed with the patient prior the conclusion of today's visit.  Barnett Applebaum, MD, [facility], [facility] 10/15/2016  3:34 PM

## 2016-10-15 NOTE — Patient Instructions (Signed)
Ethinyl Estradiol; Norelgestromin skin patches What is this medicine? ETHINYL ESTRADIOL;NORELGESTROMIN (ETH in il es tra DYE ole; nor el JES troe min) skin patch is used as a contraceptive (birth control method). This medicine combines two types of female hormones, an estrogen and a progestin. This patch is used to prevent ovulation and pregnancy. This medicine may be used for other purposes; ask your health care provider or pharmacist if you have questions. COMMON BRAND NAME(S): Ortho Becky Sax What should I tell my health care provider before I take this medicine? They need to know if you have or ever had any of these conditions: -abnormal vaginal bleeding -blood vessel disease or blood clots -breast, cervical, endometrial, ovarian, liver, or uterine cancer -diabetes -gallbladder disease -heart disease or recent heart attack -high blood pressure -high cholesterol -kidney disease -liver disease -migraine headaches -stroke -systemic lupus erythematosus (SLE) -tobacco smoker -an unusual or allergic reaction to estrogens, progestins, other medicines, foods, dyes, or preservatives -pregnant or trying to get pregnant -breast-feeding How should I use this medicine? This patch is applied to the skin. Follow the directions on the prescription label. Apply to clean, dry, healthy skin on the buttock, abdomen, upper outer arm or upper torso, in a place where it will not be rubbed by tight clothing. Do not use lotions or other cosmetics on the site where the patch will go. Press the patch firmly in place for 10 seconds to ensure good contact with the skin. Change the patch every 7 days on the same day of the week for 3 weeks. You will then have a break from the patch for 1 week, after which you will apply a new patch. Do not use your medicine more often than directed. Contact your pediatrician regarding the use of this medicine in children. Special care may be needed. This medicine has been used in  female children who have started having menstrual periods. A patient package insert for the product will be given with each prescription and refill. Read this sheet carefully each time. The sheet may change frequently. Overdosage: If you think you have taken too much of this medicine contact a poison control center or emergency room at once. NOTE: This medicine is only for you. Do not share this medicine with others. What if I miss a dose? You will need to replace your patch once a week as directed. If your patch is lost or falls off, contact your health care professional for advice. You may need to use another form of birth control if your patch has been off for more than 1 day. What may interact with this medicine? Do not take this medicine with the following medication: -dasabuvir; ombitasvir; paritaprevir; ritonavir -ombitasvir; paritaprevir; ritonavir This medicine may also interact with the following medications: -acetaminophen -antibiotics or medicines for infections, especially rifampin, rifabutin, rifapentine, and griseofulvin, and possibly penicillins or tetracyclines -aprepitant -ascorbic acid (vitamin C) -atorvastatin -barbiturate medicines, such as phenobarbital -bosentan -carbamazepine -caffeine -clofibrate -cyclosporine -dantrolene -doxercalciferol -felbamate -grapefruit juice -hydrocortisone -medicines for anxiety or sleeping problems, such as diazepam or temazepam -medicines for diabetes, including pioglitazone -modafinil -mycophenolate -nefazodone -oxcarbazepine -phenytoin -prednisolone -ritonavir or other medicines for HIV infection or AIDS -rosuvastatin -selegiline -soy isoflavones supplements -St. John's wort -tamoxifen or raloxifene -theophylline -thyroid hormones -topiramate -warfarin This list may not describe all possible interactions. Give your health care provider a list of all the medicines, herbs, non-prescription drugs, or dietary supplements  you use. Also tell them if you smoke, drink alcohol, or use illegal drugs.  Some items may interact with your medicine. What should I watch for while using this medicine? Visit your doctor or health care professional for regular checks on your progress. You will need a regular breast and pelvic exam and Pap smear while on this medicine. Use an additional method of contraception during the first cycle that you use this patch. If you have any reason to think you are pregnant, stop using this medicine right away and contact your doctor or health care professional. If you are using this medicine for hormone related problems, it may take several cycles of use to see improvement in your condition. Smoking increases the risk of getting a blood clot or having a stroke while you are using hormonal birth control, especially if you are more than 20 years old. You are strongly advised not to smoke. This medicine can make your body retain fluid, making your fingers, hands, or ankles swell. Your blood pressure can go up. Contact your doctor or health care professional if you feel you are retaining fluid. This medicine can make you more sensitive to the sun. Keep out of the sun. If you cannot avoid being in the sun, wear protective clothing and use sunscreen. Do not use sun lamps or tanning beds/booths. If you wear contact lenses and notice visual changes, or if the lenses begin to feel uncomfortable, consult your eye care specialist. In some women, tenderness, swelling, or minor bleeding of the gums may occur. Notify your dentist if this happens. Brushing and flossing your teeth regularly may help limit this. See your dentist regularly and inform your dentist of the medicines you are taking. If you are going to have elective surgery or a MRI, you may need to stop using this medicine before the surgery or MRI. Consult your health care professional for advice. This medicine does not protect you against HIV  infection (AIDS) or any other sexually transmitted diseases. What side effects may I notice from receiving this medicine? Side effects that you should report to your doctor or health care professional as soon as possible: -breast tissue changes or discharge -changes in vaginal bleeding during your period or between your periods -chest pain -coughing up blood -dizziness or fainting spells -headaches or migraines -leg, arm or groin pain -severe or sudden headaches -stomach pain (severe) -sudden shortness of breath -sudden loss of coordination, especially on one side of the body -speech problems -symptoms of vaginal infection like itching, irritation or unusual discharge -tenderness in the upper abdomen -vomiting -weakness or numbness in the arms or legs, especially on one side of the body -yellowing of the eyes or skin Side effects that usually do not require medical attention (report to your doctor or health care professional if they continue or are bothersome): -breakthrough bleeding and spotting that continues beyond the 3 initial cycles of pills -breast tenderness -mood changes, anxiety, depression, frustration, anger, or emotional outbursts -increased sensitivity to sun or ultraviolet light -nausea -skin rash, acne, or brown spots on the skin -weight gain (slight) This list may not describe all possible side effects. Call your doctor for medical advice about side effects. You may report side effects to FDA at 1-800-FDA-1088. Where should I keep my medicine? Keep out of the reach of children. Store at room temperature between 15 and 30 degrees C (59 and 86 degrees F). Keep the patch in its pouch until time of use. Throw away any unused medicine after the expiration date. Dispose of used patches properly. Since a used patch may   still contain active hormones, fold the patch in half so that it sticks to itself prior to disposal. Throw away in a place where children or pets cannot reach. NOTE:  This sheet is a summary. It may not cover all possible information. If you have questions about this medicine, talk to your doctor, pharmacist, or health care provider.  2018 Elsevier/Gold Standard (2016-02-04 07:59:03)   Medroxyprogesterone injection [Contraceptive] What is this medicine? MEDROXYPROGESTERONE (me DROX ee proe JES te rone) contraceptive injections prevent pregnancy. They provide effective birth control for 3 months. Depo-subQ Provera 104 is also used for treating pain related to endometriosis. This medicine may be used for other purposes; ask your health care provider or pharmacist if you have questions. COMMON BRAND NAME(S): Depo-Provera, Depo-subQ Provera 104 What should I tell my health care provider before I take this medicine? They need to know if you have any of these conditions: -frequently drink alcohol -asthma -blood vessel disease or a history of a blood clot in the lungs or legs -bone disease such as osteoporosis -breast cancer -diabetes -eating disorder (anorexia nervosa or bulimia) -high blood pressure -HIV infection or AIDS -kidney disease -liver disease -mental depression -migraine -seizures (convulsions) -stroke -tobacco smoker -vaginal bleeding -an unusual or allergic reaction to medroxyprogesterone, other hormones, medicines, foods, dyes, or preservatives -pregnant or trying to get pregnant -breast-feeding How should I use this medicine? Depo-Provera Contraceptive injection is given into a muscle. Depo-subQ Provera 104 injection is given under the skin. These injections are given by a health care professional. You must not be pregnant before getting an injection. The injection is usually given during the first 5 days after the start of a menstrual period or 6 weeks after delivery of a baby. Talk to your pediatrician regarding the use of this medicine in children. Special care may be needed. These injections have been used in female children who have  started having menstrual periods. Overdosage: If you think you have taken too much of this medicine contact a poison control center or emergency room at once. NOTE: This medicine is only for you. Do not share this medicine with others. What if I miss a dose? Try not to miss a dose. You must get an injection once every 3 months to maintain birth control. If you cannot keep an appointment, call and reschedule it. If you wait longer than 13 weeks between Depo-Provera contraceptive injections or longer than 14 weeks between Depo-subQ Provera 104 injections, you could get pregnant. Use another method for birth control if you miss your appointment. You may also need a pregnancy test before receiving another injection. What may interact with this medicine? Do not take this medicine with any of the following medications: -bosentan This medicine may also interact with the following medications: -aminoglutethimide -antibiotics or medicines for infections, especially rifampin, rifabutin, rifapentine, and griseofulvin -aprepitant -barbiturate medicines such as phenobarbital or primidone -bexarotene -carbamazepine -medicines for seizures like ethotoin, felbamate, oxcarbazepine, phenytoin, topiramate -modafinil -St. John's wort This list may not describe all possible interactions. Give your health care provider a list of all the medicines, herbs, non-prescription drugs, or dietary supplements you use. Also tell them if you smoke, drink alcohol, or use illegal drugs. Some items may interact with your medicine. What should I watch for while using this medicine? This drug does not protect you against HIV infection (AIDS) or other sexually transmitted diseases. Use of this product may cause you to lose calcium from your bones. Loss of calcium may cause weak bones (osteoporosis).  Only use this product for more than 2 years if other forms of birth control are not right for you. The longer you use this product for  birth control the more likely you will be at risk for weak bones. Ask your health care professional how you can keep strong bones. You may have a change in bleeding pattern or irregular periods. Many females stop having periods while taking this drug. If you have received your injections on time, your chance of being pregnant is very low. If you think you may be pregnant, see your health care professional as soon as possible. Tell your health care professional if you want to get pregnant within the next year. The effect of this medicine may last a long time after you get your last injection. What side effects may I notice from receiving this medicine? Side effects that you should report to your doctor or health care professional as soon as possible: -allergic reactions like skin rash, itching or hives, swelling of the face, lips, or tongue -breast tenderness or discharge -breathing problems -changes in vision -depression -feeling faint or lightheaded, falls -fever -pain in the abdomen, chest, groin, or leg -problems with balance, talking, walking -unusually weak or tired -yellowing of the eyes or skin Side effects that usually do not require medical attention (report to your doctor or health care professional if they continue or are bothersome): -acne -fluid retention and swelling -headache -irregular periods, spotting, or absent periods -temporary pain, itching, or skin reaction at site where injected -weight gain This list may not describe all possible side effects. Call your doctor for medical advice about side effects. You may report side effects to FDA at 1-800-FDA-1088. Where should I keep my medicine? This does not apply. The injection will be given to you by a health care professional. NOTE: This sheet is a summary. It may not cover all possible information. If you have questions about this medicine, talk to your doctor, pharmacist, or health care provider.  2018 Elsevier/Gold  Standard (2008-06-16 18:37:56)

## 2016-10-16 LAB — BETA HCG QUANT (REF LAB): hCG Quant: 2078 m[IU]/mL

## 2016-10-16 LAB — HEMOGLOBIN: HEMOGLOBIN: 10 g/dL — AB (ref 11.1–15.9)

## 2016-10-16 NOTE — Progress Notes (Signed)
Pt aware.

## 2016-10-16 NOTE — Progress Notes (Signed)
Let patient know her beta hCG levels continue to drop appropriately and this is good news.  She does have a slightly low blood count and should take iron supplement pill daily, and this may help with energy and stamina.

## 2017-01-29 ENCOUNTER — Encounter: Payer: Self-pay | Admitting: Emergency Medicine

## 2017-01-29 ENCOUNTER — Emergency Department
Admission: EM | Admit: 2017-01-29 | Discharge: 2017-01-29 | Disposition: A | Discharge status: Home / Self Care | Payer: Medicaid Other | Attending: Student in an Organized Health Care Education/Training Program | Admitting: Student in an Organized Health Care Education/Training Program

## 2017-01-29 ENCOUNTER — Emergency Department: Payer: Medicaid Other

## 2017-01-29 DIAGNOSIS — Z79899 Other long term (current) drug therapy: Secondary | ICD-10-CM | POA: Insufficient documentation

## 2017-01-29 DIAGNOSIS — S93492D Sprain of other ligament of left ankle, subsequent encounter: Secondary | ICD-10-CM | POA: Diagnosis not present

## 2017-01-29 DIAGNOSIS — X500XXD Overexertion from strenuous movement or load, subsequent encounter: Secondary | ICD-10-CM | POA: Diagnosis not present

## 2017-01-29 DIAGNOSIS — M25572 Pain in left ankle and joints of left foot: Secondary | ICD-10-CM | POA: Diagnosis present

## 2017-01-29 DIAGNOSIS — S93492A Sprain of other ligament of left ankle, initial encounter: Secondary | ICD-10-CM

## 2017-01-29 MED ORDER — MELOXICAM 15 MG PO TABS: 15.0000 mg | ORAL_TABLET | Freq: Every day | ORAL | 0 refills | Status: DC

## 2017-01-29 NOTE — ED Provider Notes (Signed)
[facility] Emergency Department Provider Note  ____________________________________________  Time seen: Approximately 4:01 PM  I have reviewed the triage vital signs and the nursing notes.   HISTORY  Chief Complaint Foot Pain    HPI Michele Alexander is a 21 y.o. female who presents emergency department complaining of continued left ankle and foot pain. Patient reports that she suffered an injury 4 days prior on home. Patient reports that she was coming down a solitary step when she caught her foot, causing it to twist. Patient reports that she had pain and swelling and was evaluated by her primary care provider. X-ray was performed and she reports the primary care told her there was no visible fractures. They report that she had a ankle sprain. Patient is concerned that she has continued pain, continued swelling to the ankle. Patient is not using any immobilization. She is ambulatory on the ankle. Patient is taking 600 mg ibuprofen every 4-6 hours for pain and swelling. No other injury or complaint.   Past Medical History:  Diagnosis Date  . Anemia   . Anxiety   . Cancer ([place])    Hodgkin's Lymphoma  . Depression   . Hodgkin's disease(201)     Patient Active Problem List   Diagnosis Date Noted  . Pregnancy, supervision, high-risk, first trimester 04/23/2015  . History of Hodgkin's lymphoma 04/23/2015  . Sickle cell trait ([place]) 04/23/2015  . Nausea & vomiting 01/14/2015    Past Surgical History:  Procedure Laterality Date  . DILATION AND EVACUATION N/A 12/21/2015   Procedure: DILATATION AND EVACUATION;  Surgeon: Will Bonnet, MD;  Location: [facility] ORS;  Service: Gynecology;  Laterality: N/A;  . NECK LESION BIOPSY    . OTHER SURGICAL HISTORY N/A 2015   Endoscopy  . PORT-A-CATH REMOVAL    . PORTACATH PLACEMENT Left 2010    Prior to Admission medications   Medication Sig Start Date End Date Taking? Authorizing Provider   Hydrocodone-Acetaminophen 10-300 MG TABS Take 1 tablet by mouth every 6 (six) hours as needed.    [provider]  meloxicam (MOBIC) 15 MG tablet Take 1 tablet (15 mg total) by mouth daily. 01/29/17   Erion Hermans, Charline Bills, PA-C  misoprostol (CYTOTEC) 200 MCG tablet Place three tablets in between your gums and cheeks as instructed repeat in 3-hrs as needed times 1 additional dose 10/13/16   Rod Can, CNM  Prenatal Vit-Fe Fumarate-FA (PRENATAL VITAMIN PO) Take by mouth.    [provider]  promethazine (PHENERGAN) 25 MG tablet Take 25 mg by mouth every 6 (six) hours as needed for nausea or vomiting.    [provider]    Allergies Other; Nexium [esomeprazole magnesium]; and Cheese  Family History  Problem Relation Age of Onset  . Hypertension Mother   . Diabetes Maternal Grandfather   . Hypertension Maternal Grandfather   . Cancer Maternal Grandmother   . Cancer Paternal Grandfather     Social History Social History  Substance Use Topics  . Smoking status: Never Smoker  . Smokeless tobacco: Never Used  . Alcohol use No     Review of Systems  Constitutional: No fever/chills Eyes: No visual changes.  Cardiovascular: no chest pain. Respiratory: no cough. No SOB. Gastrointestinal: No abdominal pain.  No nausea, no vomiting. Musculoskeletal: Positive for left ankle/foot pain Skin: Negative for rash, abrasions, lacerations, ecchymosis. Neurological: Negative for headaches, focal weakness or numbness. 10-point ROS otherwise negative.  ____________________________________________   PHYSICAL EXAM:  VITAL SIGNS: ED Triage  Vitals  Enc Vitals Group     BP 01/29/17 1548 108/66     Pulse Rate 01/29/17 1548 79     Resp --      Temp 01/29/17 1548 99.1 F (37.3 C)     Temp Source 01/29/17 1548 Oral     SpO2 01/29/17 1548 99 %     Weight 01/29/17 1551 89 lb (40.4 kg)     Height 01/29/17 1551 4\' 11"  (1.499 m)     Head Circumference --      Peak Flow  --      Pain Score 01/29/17 1547 8     Pain Loc --      Pain Edu? --      Excl. in [place]? --      Constitutional: Alert and oriented. Well appearing and in no acute distress. Eyes: Conjunctivae are normal. PERRL. EOMI. Head: Atraumatic. Neck: No stridor.    Cardiovascular: Normal rate, regular rhythm. Normal S1 and S2.  Good peripheral circulation. Respiratory: Normal respiratory effort without tachypnea or retractions. Lungs CTAB. Good air entry to the bases with no decreased or absent breath sounds. Musculoskeletal: Full range of motion to all extremities. No gross deformities appreciated. No visible deformity or gross edema to the left ankle or foot on inspection. No ecchymosis. Patient is tender to palpation over the Achilles tendon and the anterior talofibular ligament. No palpable abnormality. No other tenderness to palpation. Dorsalis pedis pulse intact. Full range of motion to the ankle and all digits of the foot. Sensation intact all 5 digits left foot. Neurologic:  Normal speech and language. No gross focal neurologic deficits are appreciated.  Skin:  Skin is warm, dry and intact. No rash noted. Psychiatric: Mood and affect are normal. Speech and behavior are normal. Patient exhibits appropriate insight and judgement.   ____________________________________________   LABS (all labs ordered are listed, but only abnormal results are displayed)  Labs Reviewed - No data to display ____________________________________________  EKG   ____________________________________________  RADIOLOGY Michele Alexander, personally viewed and evaluated these images (plain radiographs) as part of my medical decision making, as well as reviewing the written report by the radiologist.  Dg Ankle Complete Left  Result Date: 01/29/2017 CLINICAL DATA:  Pain following twisting injury EXAM: LEFT ANKLE COMPLETE - 3+ VIEW COMPARISON:  None. FINDINGS: Frontal, oblique, and lateral views were  obtained. There is soft tissue swelling laterally. There is no fracture or joint effusion. No appreciable joint space narrowing or erosion. The ankle mortise appears intact. IMPRESSION: Soft tissue swelling laterally. No fracture or appreciable arthropathy. Ankle mortise appears intact. Electronically Signed   By: Lowella Grip III M.D.   On: 01/29/2017 16:26   Dg Foot Complete Left  Result Date: 01/29/2017 CLINICAL DATA:  Pain following twisting injury EXAM: LEFT FOOT - COMPLETE 3+ VIEW COMPARISON:  None. FINDINGS: Frontal, oblique, and lateral views were obtained. There is soft tissue swelling along the lateral aspect of the foot, lateral to the fifth MTP joint region. There is no evident fracture or dislocation. Joint spaces appear normal. No erosive change. IMPRESSION: Soft tissue swelling laterally. No fracture or dislocation. No appreciable arthropathy. Electronically Signed   By: Lowella Grip III M.D.   On: 01/29/2017 16:25    ____________________________________________    PROCEDURES  Procedure(s) performed:    Procedures    Medications - No data to display   ____________________________________________   INITIAL IMPRESSION / ASSESSMENT AND PLAN / ED COURSE  Pertinent labs &  imaging results that were available during my care of the patient were reviewed by me and considered in my medical decision making (see chart for details).  Review of the [place] CSRS was performed in accordance of the [place] prior to dispensing any controlled drugs.     Patient's diagnosis is consistent with anterior talofibular ligament ankle sprain. X-ray reveals no acute osseous abnormality. Exam is reassuring.. Patient will be discharged home with prescriptions for meloxicam. Patient is to follow up with orthopedics as needed or otherwise directed. Patient is given ED precautions to return to the ED for any worsening or new symptoms.     ____________________________________________  FINAL  CLINICAL IMPRESSION(S) / ED DIAGNOSES  Final diagnoses:  Sprain of anterior talofibular ligament of left ankle, initial encounter      NEW MEDICATIONS STARTED DURING THIS VISIT:  New Prescriptions   MELOXICAM (MOBIC) 15 MG TABLET    Take 1 tablet (15 mg total) by mouth daily.        This chart was dictated using voice recognition software/Dragon. Despite best efforts to proofread, errors can occur which can change the meaning. Any change was purely unintentional.    Darletta Moll, PA-C 01/29/17 1643    Merlyn Lot, MD 01/29/17 [phone]

## 2017-01-29 NOTE — ED Notes (Signed)
See triage note  States she fell of back step on Sunday  conts to have pain at ankle   Pain is mainly posterior  Good pulses

## 2017-01-29 NOTE — ED Notes (Signed)
X-ray at bedside

## 2017-01-29 NOTE — ED Triage Notes (Signed)
Pt s/p fall with left foot injury on Sunday. Pt went to pcp on Tuesday with diagnosis of sprain. Here today due to continued pain, swelling.

## 2017-03-13 ENCOUNTER — Emergency Department: Payer: Medicaid Other

## 2017-03-13 ENCOUNTER — Emergency Department
Admission: EM | Admit: 2017-03-13 | Discharge: 2017-03-13 | Disposition: A | Discharge status: Home / Self Care | Payer: Medicaid Other | Attending: Emergency Medicine | Admitting: Emergency Medicine

## 2017-03-13 ENCOUNTER — Encounter: Payer: Self-pay | Admitting: Emergency Medicine

## 2017-03-13 DIAGNOSIS — R110 Nausea: Secondary | ICD-10-CM | POA: Insufficient documentation

## 2017-03-13 DIAGNOSIS — R1031 Right lower quadrant pain: Secondary | ICD-10-CM | POA: Diagnosis present

## 2017-03-13 DIAGNOSIS — Z791 Long term (current) use of non-steroidal anti-inflammatories (NSAID): Secondary | ICD-10-CM | POA: Diagnosis not present

## 2017-03-13 LAB — URINALYSIS, COMPLETE (UACMP) WITH MICROSCOPIC
Bacteria, UA: NONE SEEN
Bilirubin Urine: NEGATIVE
Glucose, UA: NEGATIVE mg/dL
KETONES UR: NEGATIVE mg/dL
Leukocytes, UA: NEGATIVE
Nitrite: NEGATIVE
PH: 7 (ref 5.0–8.0)
Protein, ur: NEGATIVE mg/dL
SPECIFIC GRAVITY, URINE: 1.019 (ref 1.005–1.030)

## 2017-03-13 LAB — COMPREHENSIVE METABOLIC PANEL
ALK PHOS: 80 U/L (ref 38–126)
ALT: 18 U/L (ref 14–54)
AST: 23 U/L (ref 15–41)
Albumin: 4.3 g/dL (ref 3.5–5.0)
Anion gap: 7 (ref 5–15)
BUN: 11 mg/dL (ref 6–20)
CALCIUM: 9.3 mg/dL (ref 8.9–10.3)
CO2: 24 mmol/L (ref 22–32)
CREATININE: 0.52 mg/dL (ref 0.44–1.00)
Chloride: 107 mmol/L (ref 101–111)
GFR calc Af Amer: >60 mL/min (ref >60)
GFR calc non Af Amer: >60 mL/min (ref >60)
GLUCOSE: 100 mg/dL — AB (ref 65–99)
Potassium: 3.5 mmol/L (ref 3.5–5.1)
Sodium: 138 mmol/L (ref 135–145)
Total Bilirubin: 0.6 mg/dL (ref 0.3–1.2)
Total Protein: 7.6 g/dL (ref 6.5–8.1)

## 2017-03-13 LAB — CBC
HCT: 37.7 % (ref 35.0–47.0)
Hemoglobin: 12.4 g/dL (ref 12.0–16.0)
MCH: 24.3 pg — AB (ref 26.0–34.0)
MCHC: 32.8 g/dL (ref 32.0–36.0)
MCV: 74.1 fL — ABNORMAL LOW (ref 80.0–100.0)
PLATELETS: 344 10*3/uL (ref 150–440)
RBC: 5.09 MIL/uL (ref 3.80–5.20)
RDW: 16.2 % — ABNORMAL HIGH (ref 11.5–14.5)
WBC: 4.3 10*3/uL (ref 3.6–11.0)

## 2017-03-13 LAB — POCT PREGNANCY, URINE: PREG TEST UR: NEGATIVE

## 2017-03-13 LAB — LIPASE, BLOOD: LIPASE: 46 U/L (ref 11–51)

## 2017-03-13 MED ORDER — IBUPROFEN 600 MG PO TABS: 600.0000 mg | ORAL_TABLET | Freq: Three times a day (TID) | ORAL | 0 refills | Status: DC | PRN

## 2017-03-13 MED ORDER — ONDANSETRON 4 MG PO TBDP: 4.0000 mg | ORAL_TABLET | Freq: Three times a day (TID) | ORAL | 0 refills | Status: DC | PRN

## 2017-03-13 MED ORDER — IOPAMIDOL (ISOVUE-300) INJECTION 61%
75.0000 mL | Freq: Once | INTRAVENOUS | Status: AC | PRN
Start: 2017-03-13 — End: 2017-03-13
  Administered 2017-03-13: 75 mL via INTRAVENOUS
  Filled 2017-03-13: qty 75

## 2017-03-13 NOTE — Discharge Instructions (Signed)
Please take your pain and nausea medication as needed and make an appointment to follow-up with your primary care physician this coming week for reexamination. Return to the emergency department sooner for any new or worsening symptoms such as if you cannot eat or drink, for worsening pain, or for any other concerns whatsoever.  It was a pleasure to take care of you today, and thank you for coming to our emergency department.  If you have any questions or concerns before leaving please ask the nurse to grab me and I'm more than happy to go through your aftercare instructions again.  If you were prescribed any opioid pain medication today such as Norco, Vicodin, Percocet, morphine, hydrocodone, or oxycodone please make sure you do not drive when you are taking this medication as it can alter your ability to drive safely.  If you have any concerns once you are home that you are not improving or are in fact getting worse before you can make it to your follow-up appointment, please do not hesitate to call [phone] and come back for further evaluation.  Darel Hong, MD  Results for orders placed or performed during the hospital encounter of 03/13/17  Lipase, blood  Result Value Ref Range   Lipase 46 11 - 51 U/L  Comprehensive metabolic panel  Result Value Ref Range   Sodium 138 135 - 145 mmol/L   Potassium 3.5 3.5 - 5.1 mmol/L   Chloride 107 101 - 111 mmol/L   CO2 24 22 - 32 mmol/L   Glucose, Bld 100 (H) 65 - 99 mg/dL   BUN 11 6 - 20 mg/dL   Creatinine, Ser 0.52 0.44 - 1.00 mg/dL   Calcium 9.3 8.9 - 10.3 mg/dL   Total Protein 7.6 6.5 - 8.1 g/dL   Albumin 4.3 3.5 - 5.0 g/dL   AST 23 15 - 41 U/L   ALT 18 14 - 54 U/L   Alkaline Phosphatase 80 38 - 126 U/L   Total Bilirubin 0.6 0.3 - 1.2 mg/dL   GFR calc non Af Amer >60 >60 mL/min   GFR calc Af Amer >60 >60 mL/min   Anion gap 7 5 - 15  CBC  Result Value Ref Range   WBC 4.3 3.6 - 11.0 K/uL   RBC 5.09 3.80 - 5.20 MIL/uL   Hemoglobin 12.4 12.0  - 16.0 g/dL   HCT 37.7 35.0 - 47.0 %   MCV 74.1 (L) 80.0 - 100.0 fL   MCH 24.3 (L) 26.0 - 34.0 pg   MCHC 32.8 32.0 - 36.0 g/dL   RDW 16.2 (H) 11.5 - 14.5 %   Platelets 344 150 - 440 K/uL  Urinalysis, Complete w Microscopic  Result Value Ref Range   Color, Urine YELLOW (A) YELLOW   APPearance HAZY (A) CLEAR   Specific Gravity, Urine 1.019 1.005 - 1.030   pH 7.0 5.0 - 8.0   Glucose, UA NEGATIVE NEGATIVE mg/dL   Hgb urine dipstick SMALL (A) NEGATIVE   Bilirubin Urine NEGATIVE NEGATIVE   Ketones, ur NEGATIVE NEGATIVE mg/dL   Protein, ur NEGATIVE NEGATIVE mg/dL   Nitrite NEGATIVE NEGATIVE   Leukocytes, UA NEGATIVE NEGATIVE   RBC / HPF 0-5 0 - 5 RBC/hpf   WBC, UA 0-5 0 - 5 WBC/hpf   Bacteria, UA NONE SEEN NONE SEEN   Squamous Epithelial / LPF 0-5 (A) NONE SEEN   Mucus PRESENT    Amorphous Crystal PRESENT   Pregnancy, urine POC  Result Value Ref Range  Preg Test, Ur NEGATIVE NEGATIVE   Ct Abdomen Pelvis W Contrast  Result Date: 03/13/2017 CLINICAL DATA:  Pelvic pain for 1 week EXAM: CT ABDOMEN AND PELVIS WITH CONTRAST TECHNIQUE: Multidetector CT imaging of the abdomen and pelvis was performed using the standard protocol following bolus administration of intravenous contrast. CONTRAST:  17mL ISOVUE-300 IOPAMIDOL (ISOVUE-300) INJECTION 61% COMPARISON:  None. FINDINGS: Lower chest: No acute abnormality. Hepatobiliary: No focal liver abnormality is seen. No gallstones, gallbladder wall thickening, or biliary dilatation. Pancreas: Unremarkable. No pancreatic ductal dilatation or surrounding inflammatory changes. Spleen: Normal in size without focal abnormality. Adrenals/Urinary Tract: Adrenal glands are unremarkable. Kidneys are normal, without renal calculi, focal lesion, or hydronephrosis. Bladder is unremarkable. Stomach/Bowel: Stomach is within normal limits. Appendix not definitively visualized however no right lower quadrant inflammation is seen. No evidence of bowel wall thickening,  distention, or inflammatory changes. Vascular/Lymphatic: No significant vascular findings are present. No enlarged abdominal or pelvic lymph nodes. Reproductive: Uterus and bilateral adnexa are unremarkable. Other: No abdominal wall hernia or abnormality. No abdominopelvic ascites. Musculoskeletal: No acute or significant osseous findings. IMPRESSION: No CT evidence for acute intra-abdominal or pelvic abnormality Electronically Signed   By: Donavan Foil M.D.   On: 03/13/2017 21:09

## 2017-03-13 NOTE — ED Provider Notes (Signed)
[facility] Emergency Department Provider Note  ____________________________________________   First MD Initiated Contact with Patient 03/13/17 2030     (approximate)  I have reviewed the triage vital signs and the nursing notes.   HISTORY  Chief Complaint Abdominal Pain and Nausea    HPI Michele Alexander is a 21 y.o. female who self presents to emergency Department with roughly 5 days of intermittent mild to moderate cramping lower abdominal discomfort. The pain initially was bilateral lower quadrants but now has settled into the right lower quadrant. It is associated with nausea but no vomiting. She denies dysuria frequency or hesitancy. She is worried that this might be an ovarian cyst because she's had one in the past. The pain is now rather constant and is not twisting not intermittent and not colicky. Nothing in particular seems to make it better or worse. It is nonradiating.   Past Medical History:  Diagnosis Date  . Anemia   . Anxiety   . Cancer ([place])    Hodgkin's Lymphoma  . Depression   . Hodgkin's disease(201)   . Sickle cell anemia ([facility])    traits    Patient Active Problem List   Diagnosis Date Noted  . Pregnancy, supervision, high-risk, first trimester 04/23/2015  . History of Hodgkin's lymphoma 04/23/2015  . Sickle cell trait ([place]) 04/23/2015  . Nausea & vomiting 01/14/2015    Past Surgical History:  Procedure Laterality Date  . DILATION AND EVACUATION N/A 12/21/2015   Procedure: DILATATION AND EVACUATION;  Surgeon: Will Bonnet, MD;  Location: [facility] ORS;  Service: Gynecology;  Laterality: N/A;  . NECK LESION BIOPSY    . OTHER SURGICAL HISTORY N/A 2015   Endoscopy  . PORT-A-CATH REMOVAL    . PORTACATH PLACEMENT Left 2010    Prior to Admission medications   Medication Sig Start Date End Date Taking? Authorizing Provider  Hydrocodone-Acetaminophen 10-300 MG TABS Take 1 tablet by mouth every 6 (six) hours as needed.     [provider]  ibuprofen (ADVIL,MOTRIN) 600 MG tablet Take 1 tablet (600 mg total) by mouth every 8 (eight) hours as needed. 03/13/17   Darel Hong, MD  meloxicam (MOBIC) 15 MG tablet Take 1 tablet (15 mg total) by mouth daily. 01/29/17   Cuthriell, Charline Bills, PA-C  misoprostol (CYTOTEC) 200 MCG tablet Place three tablets in between your gums and cheeks as instructed repeat in 3-hrs as needed times 1 additional dose 10/13/16   Rod Can, CNM  ondansetron (ZOFRAN ODT) 4 MG disintegrating tablet Take 1 tablet (4 mg total) by mouth every 8 (eight) hours as needed for nausea or vomiting. 03/13/17   Darel Hong, MD  Prenatal Vit-Fe Fumarate-FA (PRENATAL VITAMIN PO) Take by mouth.    [provider]  promethazine (PHENERGAN) 25 MG tablet Take 25 mg by mouth every 6 (six) hours as needed for nausea or vomiting.    [provider]    Allergies Other; Nexium [esomeprazole magnesium]; and Cheese  Family History  Problem Relation Age of Onset  . Hypertension Mother   . Diabetes Maternal Grandfather   . Hypertension Maternal Grandfather   . Cancer Maternal Grandmother   . Cancer Paternal Grandfather     Social History Social History  Substance Use Topics  . Smoking status: Never Smoker  . Smokeless tobacco: Never Used  . Alcohol use No    Review of Systems Constitutional: No fever/chills Eyes: No visual changes. ENT: No sore throat. Cardiovascular: Denies chest pain. Respiratory:  Denies shortness of breath. Gastrointestinal: Positive for abdominal pain.  Positive for nausea, no vomiting.  No diarrhea.  No constipation. Genitourinary: Negative for dysuria. Musculoskeletal: Negative for back pain. Skin: Negative for rash. Neurological: Negative for headaches, focal weakness or numbness.   ____________________________________________   PHYSICAL EXAM:  VITAL SIGNS: ED Triage Vitals [03/13/17 1858]  Enc Vitals Group     BP 114/71     Pulse  Rate 91     Resp 16     Temp 99.4 F (37.4 C)     Temp Source Oral     SpO2 100 %     Weight      Height      Head Circumference      Peak Flow      Pain Score 8     Pain Loc      Pain Edu?      Excl. in [place]?     Constitutional: Alert and oriented 4 well appearing nontoxic no diaphoresis speaks in full clear sentences Eyes: PERRL EOMI. Head: Atraumatic. Nose: No congestion/rhinnorhea. Mouth/Throat: No trismus Neck: No stridor.   Cardiovascular: Normal rate, regular rhythm. Grossly normal heart sounds.  Good peripheral circulation. Respiratory: Normal respiratory effort.  No retractions. Lungs CTAB and moving good air Gastrointestinal: Soft nondistended quite tender right lower quadrant over McBurney's although no rebound no guarding no frank peritonitis and negative Rovsing's Musculoskeletal: No lower extremity edema   Neurologic:  Normal speech and language. No gross focal neurologic deficits are appreciated. Skin:  Skin is warm, dry and intact. No rash noted. Psychiatric: Mood and affect are normal. Speech and behavior are normal.    ____________________________________________   DIFFERENTIAL includes but not limited to  Appendicitis, ovarian cyst, ovarian torsion, diverticulitis, nephrolithiasis, pyelonephritis____________________________________________   LABS (all labs ordered are listed, but only abnormal results are displayed)  Labs Reviewed  COMPREHENSIVE METABOLIC PANEL - Abnormal; Notable for the following:       Result Value   Glucose, Bld 100 (*)    All other components within normal limits  CBC - Abnormal; Notable for the following:    MCV 74.1 (*)    MCH 24.3 (*)    RDW 16.2 (*)    All other components within normal limits  URINALYSIS, COMPLETE (UACMP) WITH MICROSCOPIC - Abnormal; Notable for the following:    Color, Urine YELLOW (*)    APPearance HAZY (*)    Hgb urine dipstick SMALL (*)    Squamous Epithelial / LPF 0-5 (*)    All other components  within normal limits  LIPASE, BLOOD  POCT PREGNANCY, URINE    Blood work reviewed and interpreted by me with no acute disease noted __________________________________________  EKG   ____________________________________________  RADIOLOGY  CT scan abdomen and pelvis reviewed by me with no acute disease ____________________________________________   PROCEDURES  Procedure(s) performed: no  Procedures  Critical Care performed: o  Observation: no ____________________________________________   INITIAL IMPRESSION / ASSESSMENT AND PLAN / ED COURSE  Pertinent labs & imaging results that were available during my care of the patient were reviewed by me and considered in my medical decision making (see chart for details).  The patient arrives quite well-appearing although with several days of vague abdominal pain that is now settled in her right lower quadrant associated with nausea. Concerned that she may have an early appendicitis. CT scan performed without oral contrast as the patient is anaphylactic. The CT scan shows no acute disease. Her abdominal pain is improved  and she is able to eat and drink. I explained to the patient that significant diagnostic uncertainty exists but it does not seem like she has no ovarian cyst nor appendicitis. Strict return precautions are given and the patient verbalizes understanding and agreement with the plan.      ____________________________________________   FINAL CLINICAL IMPRESSION(S) / ED DIAGNOSES  Final diagnoses:  Right lower quadrant abdominal pain  Nausea      NEW MEDICATIONS STARTED DURING THIS VISIT:  Discharge Medication List as of 03/13/2017  9:35 PM    START taking these medications   Details  ibuprofen (ADVIL,MOTRIN) 600 MG tablet Take 1 tablet (600 mg total) by mouth every 8 (eight) hours as needed., Starting Fri 03/13/2017, Print    ondansetron (ZOFRAN ODT) 4 MG disintegrating tablet Take 1 tablet (4 mg total) by  mouth every 8 (eight) hours as needed for nausea or vomiting., Starting Fri 03/13/2017, Print         Note:  This document was prepared using Dragon voice recognition software and may include unintentional dictation errors.     Darel Hong, MD 03/14/17 [phone]

## 2017-03-13 NOTE — ED Triage Notes (Signed)
Pt to ED c/o abdominal pain fort he past week. Pt states that she is also having nausea. Denies vomiting or diarrhea. Pt denies urinary symptoms.

## 2017-03-13 NOTE — ED Triage Notes (Signed)
First Nurse Note:  Arrives with C/O pelvic pain x 1 week.  Right  Worse than left.  Denies dysuria.  LMP:  03/04/2017.  AAOx3.  Skin warm and dry. Ambulates with easy and steady gait.  Posture upright and relaxed.

## 2017-04-05 ENCOUNTER — Emergency Department
Admission: EM | Admit: 2017-04-05 | Discharge: 2017-04-05 | Disposition: A | Discharge status: Home / Self Care | Payer: Medicaid Other | Attending: Emergency Medicine | Admitting: Emergency Medicine

## 2017-04-05 DIAGNOSIS — R102 Pelvic and perineal pain: Secondary | ICD-10-CM | POA: Insufficient documentation

## 2017-04-05 DIAGNOSIS — Z79899 Other long term (current) drug therapy: Secondary | ICD-10-CM | POA: Diagnosis not present

## 2017-04-05 DIAGNOSIS — Z8571 Personal history of Hodgkin lymphoma: Secondary | ICD-10-CM | POA: Diagnosis not present

## 2017-04-05 DIAGNOSIS — N921 Excessive and frequent menstruation with irregular cycle: Secondary | ICD-10-CM | POA: Diagnosis not present

## 2017-04-05 DIAGNOSIS — N938 Other specified abnormal uterine and vaginal bleeding: Secondary | ICD-10-CM | POA: Diagnosis present

## 2017-04-05 LAB — URINALYSIS, COMPLETE (UACMP) WITH MICROSCOPIC
BACTERIA UA: NONE SEEN
BILIRUBIN URINE: NEGATIVE
GLUCOSE, UA: NEGATIVE mg/dL
Ketones, ur: NEGATIVE mg/dL
LEUKOCYTES UA: NEGATIVE
NITRITE: NEGATIVE
PH: 7 (ref 5.0–8.0)
Protein, ur: NEGATIVE mg/dL
RBC / HPF: NONE SEEN RBC/hpf (ref 0–5)
SPECIFIC GRAVITY, URINE: 1.014 (ref 1.005–1.030)
Squamous Epithelial / LPF: NONE SEEN

## 2017-04-05 LAB — CBC WITH DIFFERENTIAL/PLATELET
Basophils Absolute: 0 10*3/uL (ref 0–0.1)
Basophils Relative: 1 %
EOS ABS: 0 10*3/uL (ref 0–0.7)
EOS PCT: 1 %
HCT: 35.8 % (ref 35.0–47.0)
Hemoglobin: 11.9 g/dL — ABNORMAL LOW (ref 12.0–16.0)
LYMPHS ABS: 1.3 10*3/uL (ref 1.0–3.6)
Lymphocytes Relative: 33 %
MCH: 24.8 pg — AB (ref 26.0–34.0)
MCHC: 33.2 g/dL (ref 32.0–36.0)
MCV: 74.9 fL — ABNORMAL LOW (ref 80.0–100.0)
MONO ABS: 0.3 10*3/uL (ref 0.2–0.9)
MONOS PCT: 7 %
Neutro Abs: 2.3 10*3/uL (ref 1.4–6.5)
Neutrophils Relative %: 58 %
PLATELETS: 351 10*3/uL (ref 150–440)
RBC: 4.78 MIL/uL (ref 3.80–5.20)
RDW: 15.8 % — ABNORMAL HIGH (ref 11.5–14.5)
WBC: 4 10*3/uL (ref 3.6–11.0)

## 2017-04-05 LAB — PREGNANCY, URINE: PREG TEST UR: NEGATIVE

## 2017-04-05 LAB — POCT PREGNANCY, URINE: Preg Test, Ur: NEGATIVE

## 2017-04-05 MED ORDER — HYDROCODONE-ACETAMINOPHEN 5-325 MG PO TABS: 1.0000 | ORAL_TABLET | Freq: Four times a day (QID) | ORAL | 0 refills | Status: DC | PRN

## 2017-04-05 MED ORDER — KETOROLAC TROMETHAMINE 30 MG/ML IJ SOLN
15.0000 mg | INTRAMUSCULAR | Status: AC
  Administered 2017-04-05: 15 mg via INTRAVENOUS
  Filled 2017-04-05: qty 1

## 2017-04-05 MED ORDER — NORGESTIMATE-ETH ESTRADIOL 0.25-35 MG-MCG PO TABS: ORAL_TABLET | ORAL | 0 refills | Status: DC

## 2017-04-05 MED ORDER — NAPROXEN 500 MG PO TABS: 500.0000 mg | ORAL_TABLET | Freq: Two times a day (BID) | ORAL | 0 refills | Status: DC

## 2017-04-05 NOTE — ED Triage Notes (Signed)
Pt states that she was just seen at [facility] for similar symptoms and diagnosed with endometriosis, pt states that she is having vaginal bleeding and lower abd pain, states that she has a follow up appt with dr Vikki Ports ward but states she is hurting too much to wait that long, pt states that her vaginal bleeding will be sporadic and "gush" out of nowhere, pt reports that she uses depo as her method of birth control

## 2017-04-05 NOTE — ED Notes (Signed)
See triage note. Pt reports recent dx of endometriosis. C/o bil pelvic pain and large amount of vaginal bleeding. Pt states bleeding lessens during the day and is worse at night. States she has used almost an entire 50-pack of pads in the past 2 days. +passing blood clots. Denies other vaginal discharge.

## 2017-04-05 NOTE — ED Provider Notes (Signed)
[facility] Emergency Department Provider Note  ____________________________________________  Time seen: Approximately 6:56 PM  I have reviewed the triage vital signs and the nursing notes.   HISTORY  Chief Complaint Vaginal Bleeding    HPI Michele Alexander is a 21 y.o. female who complains of vaginal bleeding for the past month. Reports a recent diagnosis  of endometriosis by pelvic ultrasound in the urine see ED. She is on Depo-Provera, last had her injection 2 months ago. At the beginning of this month, she started having vaginal bleeding described as spotting. She came to the ED, had a CT scan which was negative. Then went to [facility] ED a few days later due to worsening pelvic pain and bleeding, had an ultrasound which showed small cysts in multiple areas suggestive of endometriosis. She has a follow-up with gynecology in 2 weeks, but unfortunately has been having worsening bleeding over the past few weeks. She reports soaking through her pads and her pants frequently. No chest pain shortness of breath or syncope but she does feel cold and fatigued. No dysuria frequency urgency or irregular vaginal discharge.     Past Medical History:  Diagnosis Date  . Anemia   . Anxiety   . Cancer ([place])    Hodgkin's Lymphoma  . Depression   . Hodgkin's disease(201)   . Sickle cell anemia (HCC)    traits     Patient Active Problem List   Diagnosis Date Noted  . Pregnancy, supervision, high-risk, first trimester 04/23/2015  . History of Hodgkin's lymphoma 04/23/2015  . Sickle cell trait ([place]) 04/23/2015  . Nausea & vomiting 01/14/2015     Past Surgical History:  Procedure Laterality Date  . DILATION AND EVACUATION N/A 12/21/2015   Procedure: DILATATION AND EVACUATION;  Surgeon: Will Bonnet, MD;  Location: [facility] ORS;  Service: Gynecology;  Laterality: N/A;  . NECK LESION BIOPSY    . OTHER SURGICAL HISTORY N/A 2015   Endoscopy  . PORT-A-CATH REMOVAL     . PORTACATH PLACEMENT Left 2010     Prior to Admission medications   Medication Sig Start Date End Date Taking? Authorizing Provider  HYDROcodone-acetaminophen (NORCO) 5-325 MG tablet Take 1 tablet by mouth every 6 (six) hours as needed for moderate pain. 04/05/17   Carrie Mew, MD  naproxen (NAPROSYN) 500 MG tablet Take 1 tablet (500 mg total) by mouth 2 (two) times daily with a meal. 04/05/17   Carrie Mew, MD  norgestimate-ethinyl estradiol ([place] 28) 0.25-35 MG-MCG tablet Take 1 tablet three times a day for 3 days, then Take 1 tablet two times a day for 3 days, then Take 1 tablet one time a day for 3 days, then discontinue (total regimen 18 tablets) 04/05/17   Carrie Mew, MD  ondansetron (ZOFRAN ODT) 4 MG disintegrating tablet Take 1 tablet (4 mg total) by mouth every 8 (eight) hours as needed for nausea or vomiting. 03/13/17   Darel Hong, MD  Prenatal Vit-Fe Fumarate-FA (PRENATAL VITAMIN PO) Take by mouth.    [provider]  promethazine (PHENERGAN) 25 MG tablet Take 25 mg by mouth every 6 (six) hours as needed for nausea or vomiting.    [provider]     Allergies Other; Nexium [esomeprazole magnesium]; and Cheese   Family History  Problem Relation Age of Onset  . Hypertension Mother   . Diabetes Maternal Grandfather   . Hypertension Maternal Grandfather   . Cancer Maternal Grandmother   . Cancer Paternal Grandfather  Social History Social History  Substance Use Topics  . Smoking status: Never Smoker  . Smokeless tobacco: Never Used  . Alcohol use No    Review of Systems  Constitutional:   No fever or chills.  ENT:   No sore throat. No rhinorrhea. Cardiovascular:   No chest pain or syncope. Respiratory:   No dyspnea or cough. Gastrointestinal:   positive for nonradiating cramping pelvic pain, moderate intensity, without vomiting and diarrhea.  Musculoskeletal:   Negative for focal pain or swelling All other  systems reviewed and are negative except as documented above in ROS and HPI.  ____________________________________________   PHYSICAL EXAM:  VITAL SIGNS: ED Triage Vitals  Enc Vitals Group     BP 04/05/17 1508 (!) 128/91     Pulse Rate 04/05/17 1508 82     Resp 04/05/17 1508 16     Temp 04/05/17 1508 98.1 F (36.7 C)     Temp Source 04/05/17 1508 Oral     SpO2 04/05/17 1508 98 %     Weight 04/05/17 1509 89 lb (40.4 kg)     Height 04/05/17 1509 4\' 11"  (1.499 m)     Head Circumference --      Peak Flow --      Pain Score 04/05/17 1508 8     Pain Loc --      Pain Edu? --      Excl. in [place]? --     Vital signs reviewed, nursing assessments reviewed.   Constitutional:   Alert and oriented. Well appearing and in no distress. Eyes:   No scleral icterus.  EOMI. No nystagmus. No conjunctival pallor. PERRL. ENT   Head:   Normocephalic and atraumatic.   Nose:   No congestion/rhinnorhea.    Mouth/Throat:   MMM, no pharyngeal erythema. No peritonsillar mass.    Neck:   No meningismus. Full ROM. Hematological/Lymphatic/Immunilogical:   No cervical lymphadenopathy. Cardiovascular:   RRR. Symmetric bilateral radial and DP pulses.  No murmurs.  Respiratory:   Normal respiratory effort without tachypnea/retractions. Breath sounds are clear and equal bilaterally. No wheezes/rales/rhonchi. Gastrointestinal:   Soft with mild suprapubic tenderness. Non distended. There is no CVA tenderness.  No rebound, rigidity, or guarding. Genitourinary:   deferred Musculoskeletal:   Normal range of motion in all extremities. No joint effusions.  No lower extremity tenderness.  No edema. Neurologic:   Normal speech and language.  Motor grossly intact. No gross focal neurologic deficits are appreciated.  Skin:    Skin is warm, dry and intact. No rash noted.  No petechiae, purpura, or bullae.  ____________________________________________    LABS (pertinent positives/negatives) (all labs  ordered are listed, but only abnormal results are displayed) Labs Reviewed  URINALYSIS, COMPLETE (UACMP) WITH MICROSCOPIC - Abnormal; Notable for the following:       Result Value   Color, Urine YELLOW (*)    APPearance HAZY (*)    Hgb urine dipstick MODERATE (*)    All other components within normal limits  CBC WITH DIFFERENTIAL/PLATELET - Abnormal; Notable for the following:    Hemoglobin 11.9 (*)    MCV 74.9 (*)    MCH 24.8 (*)    RDW 15.8 (*)    All other components within normal limits  PREGNANCY, URINE  POC URINE PREG, ED  POCT PREGNANCY, URINE   ____________________________________________   EKG    ____________________________________________    RADIOLOGY  No results found.  ____________________________________________   PROCEDURES Procedures  ____________________________________________   DIFFERENTIAL DIAGNOSIS  menometrorrhagia, coagulopathy, anemia  CLINICAL IMPRESSION / ASSESSMENT AND PLAN / ED COURSE  Pertinent labs & imaging results that were available during my care of the patient were reviewed by me and considered in my medical decision making (see chart for details).   patient well-appearing no acute distress, normal vital signs, presents with symptoms of persistent vaginal bleeding despite having had a Depo-Provera injection 2 months ago. The bleeding does seem to be accelerating. Recent ultrasound performed at [facility] reviewed, no evidence of uterine fibroid. Pregnancy test is negative. Blood counts are unremarkable. I'll go ahead and discussed the case with gynecology for recommendations in advance of her planned follow-up on November 14.urinalysis unremarkable   ----------------------------------------- 7:58 PM on 04/05/2017 -----------------------------------------  Labs unremarkable, not pregnant. Case discussed with Dr. Leonides Schanz, recommends shoulder to CP taper over the next few days to control the bleeding. Patient can call the clinic in 2 or  3 days if her symptoms are not improving. Hemodynamically stable, suitable for outpatient follow-up     ____________________________________________   FINAL CLINICAL IMPRESSION(S) / ED DIAGNOSES    Final diagnoses:  Menometrorrhagia  Pelvic pain      New Prescriptions   HYDROCODONE-ACETAMINOPHEN (NORCO) 5-325 MG TABLET    Take 1 tablet by mouth every 6 (six) hours as needed for moderate pain.   NAPROXEN (NAPROSYN) 500 MG TABLET    Take 1 tablet (500 mg total) by mouth 2 (two) times daily with a meal.   NORGESTIMATE-ETHINYL ESTRADIOL (SPRINTEC 28) 0.25-35 MG-MCG TABLET    Take 1 tablet three times a day for 3 days, then Take 1 tablet two times a day for 3 days, then Take 1 tablet one time a day for 3 days, then discontinue (total regimen 18 tablets)     Portions of this note were generated with dragon dictation software. Dictation errors may occur despite best attempts at proofreading.    Carrie Mew, MD 04/05/17 [phone]

## 2017-08-14 ENCOUNTER — Other Ambulatory Visit: Payer: Self-pay

## 2017-08-18 ENCOUNTER — Encounter: Payer: Self-pay | Admitting: Surgery

## 2017-08-18 ENCOUNTER — Ambulatory Visit (INDEPENDENT_AMBULATORY_CARE_PROVIDER_SITE_OTHER): Payer: Medicaid Other | Admitting: Surgery

## 2017-08-18 VITALS — BP 115/85 | HR 83 | Temp 97.8°F | Ht 59.0 in | Wt 90.0 lb

## 2017-08-18 DIAGNOSIS — R1031 Right lower quadrant pain: Secondary | ICD-10-CM | POA: Diagnosis not present

## 2017-08-18 DIAGNOSIS — G8929 Other chronic pain: Secondary | ICD-10-CM | POA: Diagnosis not present

## 2017-08-18 NOTE — Patient Instructions (Signed)
We will call you back once we have results from your Ultrasound.

## 2017-08-18 NOTE — Progress Notes (Signed)
Michele Alexander is an 22 y.o. female.   Chief Complaint: Right lower quadrant pain Consult requested by Alonza Smoker HPI: This is a patient with right lower quadrant pain and a workup suggesting hernia. Pt seen by OBGYN for 5 months rt groin pain with bulge. Some nausea, no emesis. Strains a lot with BM.  The pain is significant enough to make her have to sit down periodically.  Of significance during the exam and after the exam the patient points to the area that is bothering her and it is not in the groin but is up in the rectus sheath closer to McBurney's point.   Past Medical History:  Diagnosis Date  . Anemia   . Anxiety   . Cancer ([place])    Hodgkin's Lymphoma  . Depression   . Hodgkin's disease(201)   . Sickle cell anemia (HCC)    traits    Past Surgical History:  Procedure Laterality Date  . DILATION AND EVACUATION N/A 12/21/2015   Procedure: DILATATION AND EVACUATION;  Surgeon: Will Bonnet, MD;  Location: [facility] ORS;  Service: Gynecology;  Laterality: N/A;  . NECK LESION BIOPSY    . OTHER SURGICAL HISTORY N/A 2015   Endoscopy  . PORT-A-CATH REMOVAL    . PORTACATH PLACEMENT Left 2010    Family History  Problem Relation Age of Onset  . Hypertension Mother   . Diabetes Maternal Grandfather   . Hypertension Maternal Grandfather   . Cancer Maternal Grandmother   . Cancer Paternal Grandfather    Social History:  reports that  has never smoked. she has never used smokeless tobacco. She reports that she does not drink alcohol or use drugs.  Allergies:  Allergies  Allergen Reactions  . Other Hives    Oral CT contrast Oral CT contrast  . Diatrizoate Hives    PO only.  Tolerates IV contrast.  . Iodinated Diagnostic Agents Hives  . Nexium [Esomeprazole Magnesium] Swelling  . Cheese Rash and Swelling    Lip swelling     (Not in a hospital admission)   Review of Systems:   Review of Systems  Constitutional: Negative.   HENT: Negative.   Eyes:  Negative.   Respiratory: Negative.   Cardiovascular: Negative.   Genitourinary: Negative.   Musculoskeletal: Negative.   Skin: Negative.   Neurological: Negative.   Endo/Heme/Allergies: Negative.   Psychiatric/Behavioral: Negative.     Physical Exam:  Physical Exam  Constitutional: She is oriented to person, place, and time and well-developed, well-nourished, and in no distress. No distress.  HENT:  Head: Normocephalic and atraumatic.  Eyes: Pupils are equal, round, and reactive to light. Right eye exhibits no discharge. Left eye exhibits no discharge. No scleral icterus.  Neck: Normal range of motion. No JVD present.  Cardiovascular: Normal rate, regular rhythm and normal heart sounds.  Pulmonary/Chest: Effort normal and breath sounds normal. No respiratory distress. She has no wheezes. She has no rales.  Abdominal: Soft. She exhibits no distension. There is no tenderness. There is no rebound.  Patient is examined standing and supine.  No bulge is noted even with straining.  Minimal if any tenderness is noted near McBurney's point.  No groin mass and no groin tenderness is noted.  (Again patient points to the area near McBurney's point and not in the groin)  Musculoskeletal: Normal range of motion. She exhibits no edema or tenderness.  Lymphadenopathy:    She has no cervical adenopathy.  Neurological: She is alert and oriented to person,  place, and time.  Skin: Skin is warm and dry. No rash noted. She is not diaphoretic. No erythema.  Vitals reviewed.   unknown if currently breastfeeding.    No results found for this or any previous visit (from the past 48 hour(s)). No results found. Labs are reviewed CT scan performed last year demonstrated no sign of hernia.  Assessment/Plan This a patient with a right lower quadrant abdominal pain. CT scan films were personally reviewed from October 2018.  I see no sign of hernia or any other abnormality at that time.  I discussed with  the patient the possible options here if this does not appear to be a hernia.  It is not in the proper location for hernia.  She states that she gets frequent CAT scans for follow-up of her Hodgkin's disease.  She had one in October which was completely negative.  I discussed with her MRI versus CAT scan versus ultrasound.  My concern is that the ultrasound would not be of much value nor would an MRI and even a CAT scan and the CAT scan well likely the more helpful of the 3 studies contains the risk of significant radiation especially if she is having them with some frequency.  At her young age and being female consideration concerning CT scan radiation exposure must be considered.  With that in mind and the fact that this is bothering the patient considerably I would recommend an ultrasound in the absence of other significant or significantly important testing.  We will call her with those results but at this time I have no surgical options in this patient. Florene Glen, MD, FACS

## 2017-08-21 ENCOUNTER — Ambulatory Visit: Admission: RE | Admit: 2017-08-21 | Payer: Medicaid Other | Source: Ambulatory Visit

## 2017-08-26 ENCOUNTER — Ambulatory Visit
Admission: RE | Admit: 2017-08-26 | Discharge: 2017-08-26 | Disposition: A | Discharge status: Home / Self Care | Payer: Medicaid Other | Source: Ambulatory Visit | Attending: Surgery | Admitting: Surgery

## 2017-08-26 DIAGNOSIS — R1031 Right lower quadrant pain: Secondary | ICD-10-CM | POA: Insufficient documentation

## 2017-09-04 ENCOUNTER — Telehealth: Payer: Self-pay

## 2017-09-04 NOTE — Telephone Encounter (Signed)
Called patient to let her know that her U/S results were normal, no evidence of hernia or mass. I recommended for her to contact her PCP if she continued to have groin pain. I also told her that at this time she was not in need of surgery, but if she ever did, to please give [place] a call. Patient understood and had no further questions.

## 2017-09-27 IMAGING — US US OB TRANSVAGINAL
1 series · 14 of 28 positions shown · non-contrast
Comparison: Obstetric ultrasound September 23, 2016 and Pelvic
ultrasound August 23, 2016

CLINICAL DATA: Vaginal bleeding and cramping for 9 hours.
Gestational age by last menstrual period 9 weeks and 4 days. Beta
HCG [DATE].

EXAM:
OBSTETRIC <14 WK US AND TRANSVAGINAL OB US
TECHNIQUE: Both transabdominal and transvaginal ultrasound examinations were
performed for complete evaluation of the gestation as well as the
maternal uterus, adnexal regions, and pelvic cul-de-sac.
Transvaginal technique was performed to assess early pregnancy.

[Series 1: us ob transvaginal · 0.14mm/px · 14 of 112 slices shown]
[im 5/112]
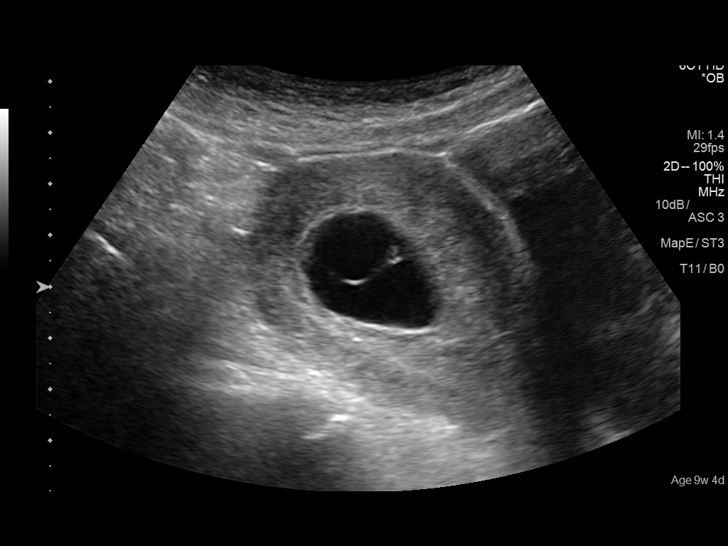
[im 13/112]
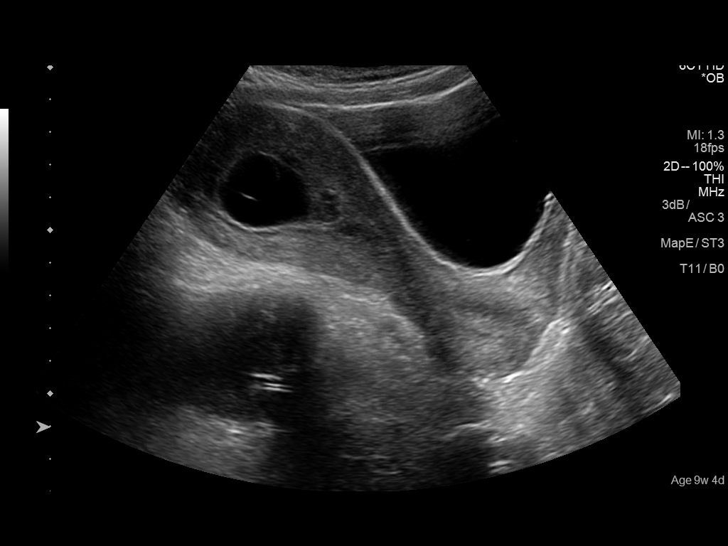
[im 21/112]
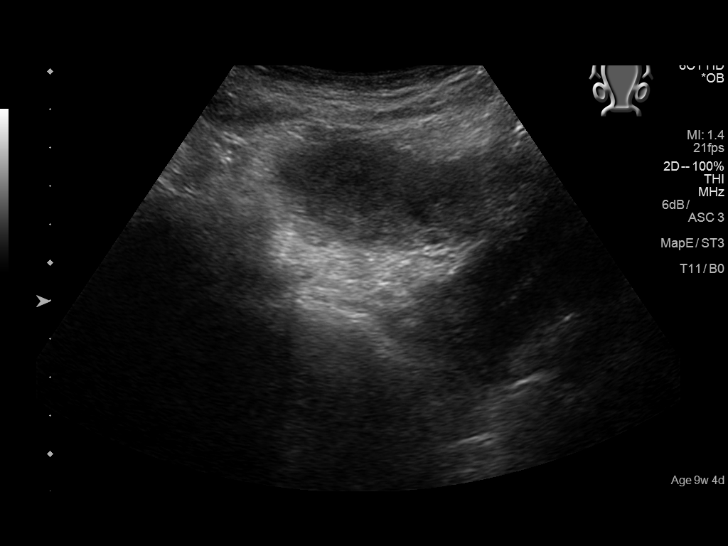
[im 29/112]
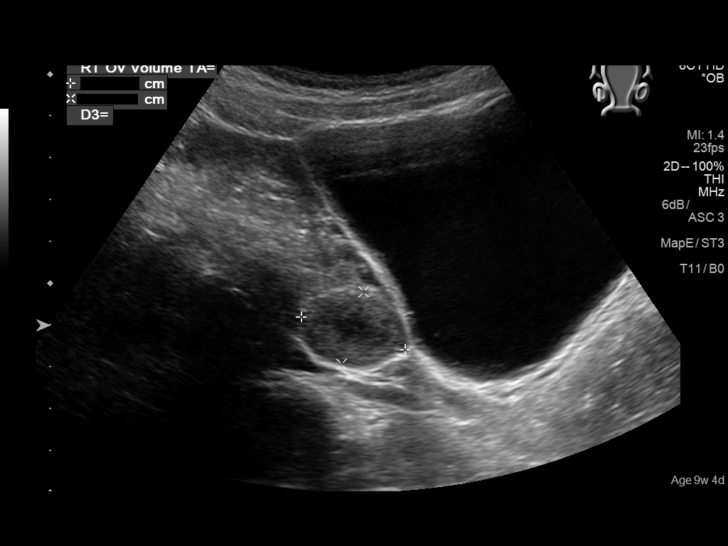
[im 38/112]
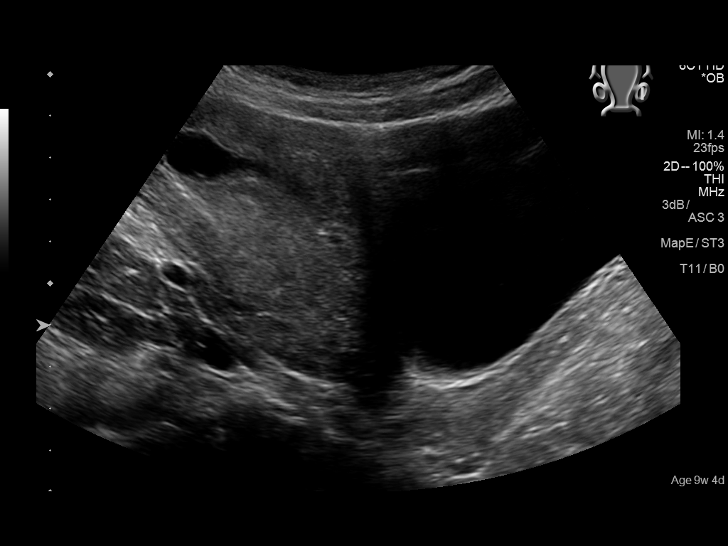
[im 46/112]
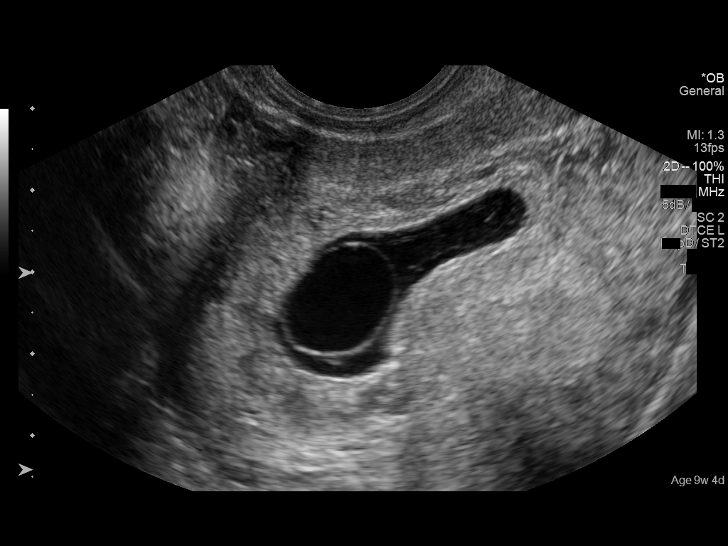
[im 54/112]
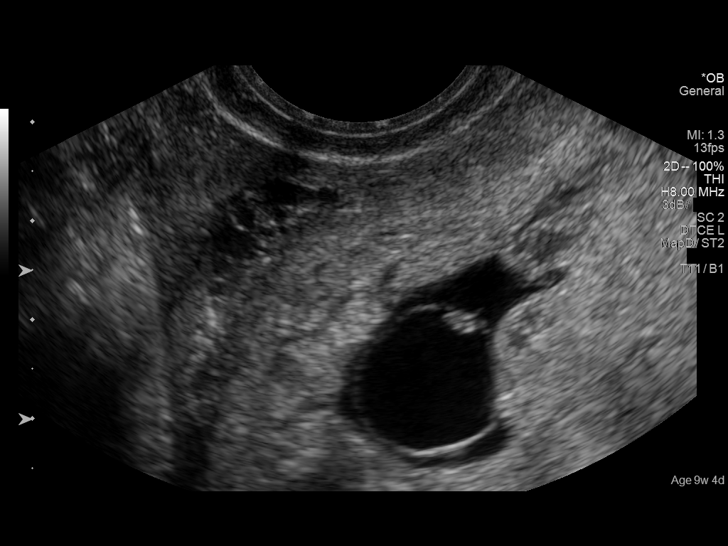
[im 62/112]
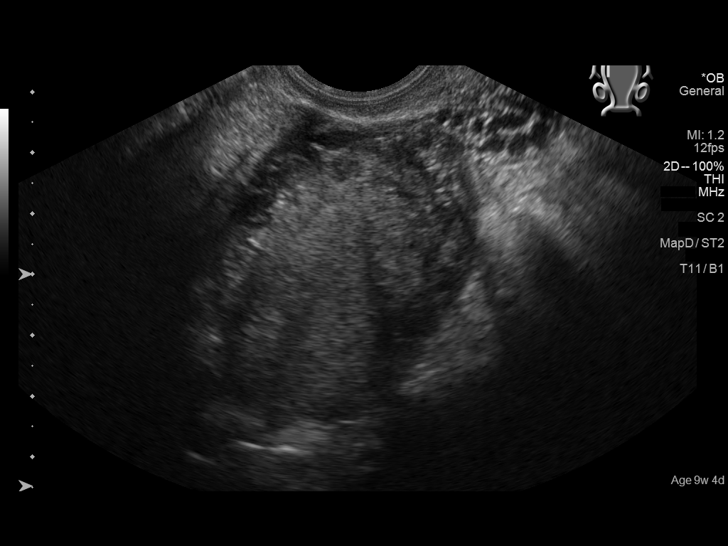
[im 70/112]
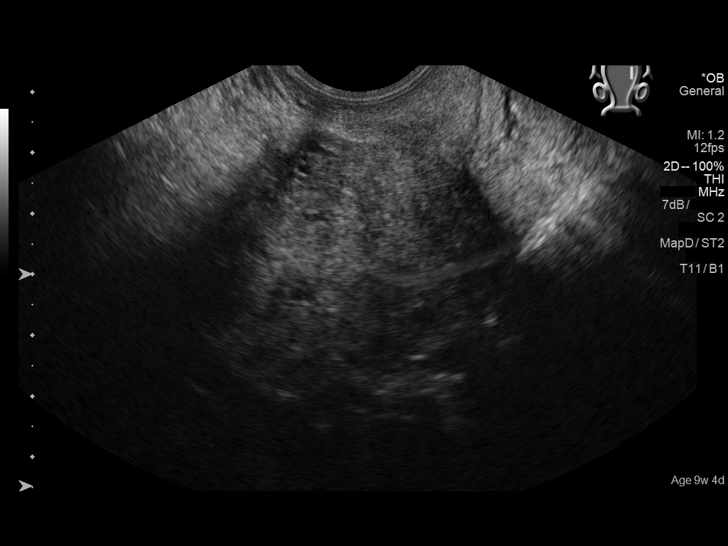
[im 79/112]
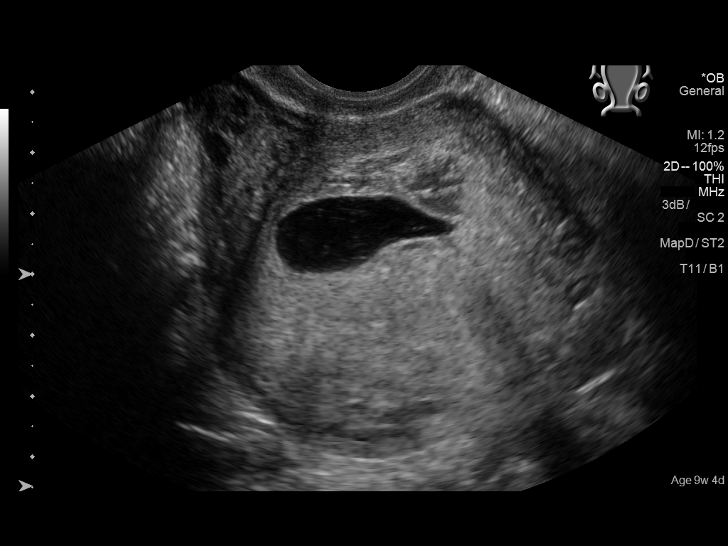
[im 87/112]
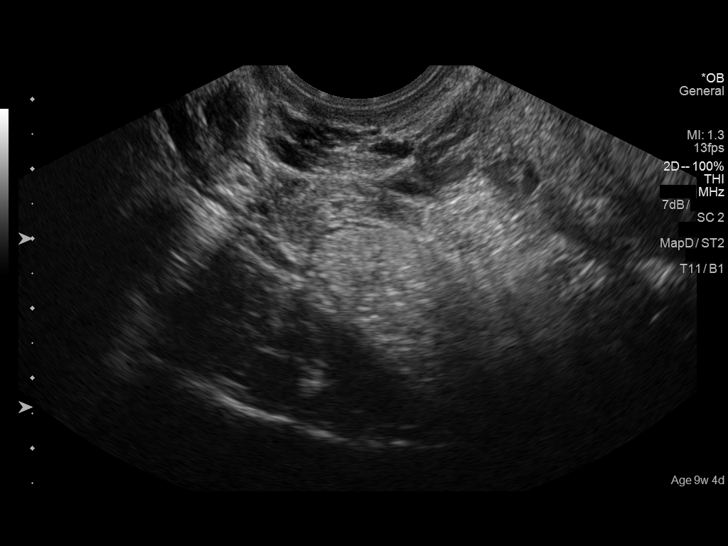
[im 95/112]
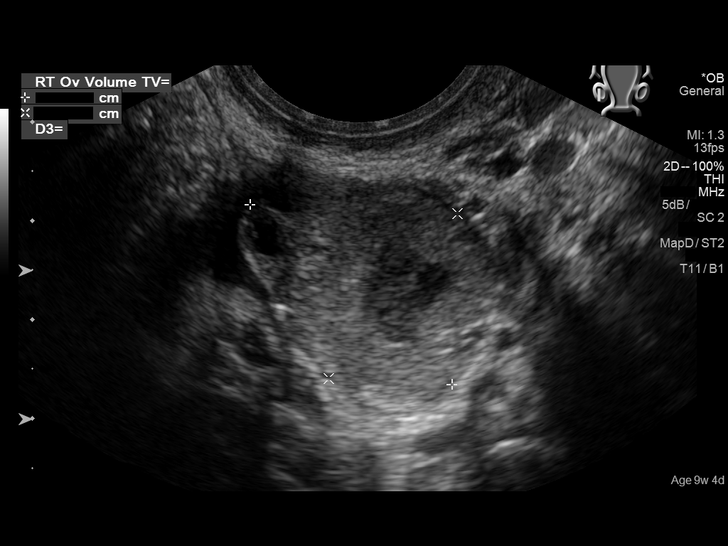
[im 103/112]
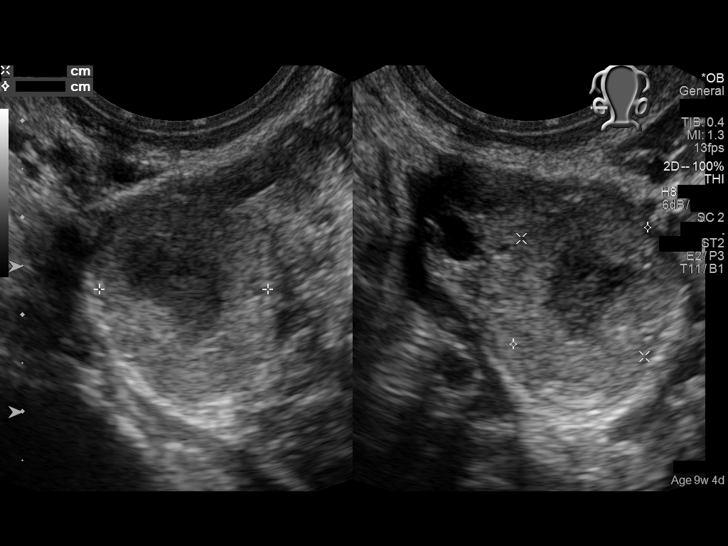
[im 112/112]
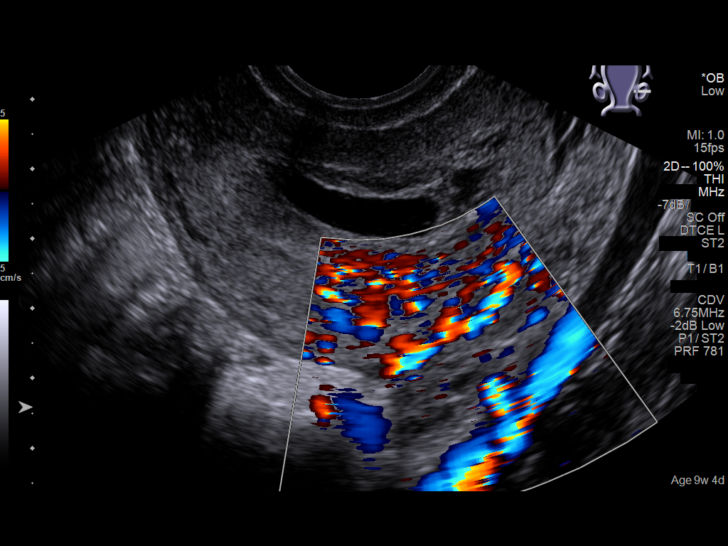

[14 of 28 positions shown; findings below may reference images not displayed]

FINDINGS: Intrauterine gestational sac: Present

Yolk sac:  Present, enlarged at 16 mm.

Embryo:  Present

Cardiac Activity: Not present

CRL:  5 mm  mm   6 w   1 d

Subchorionic hemorrhage:  Trace subchorionic hemorrhage.

Maternal uterus/adnexae: Similar RIGHT corpus luteal cyst.
IMPRESSION: No interval growth of fetal pole and no cardiac activity highly
suspicious for failed pregnancy.

## 2017-10-27 NOTE — Telephone Encounter (Signed)
disreguard ?

## 2018-01-14 ENCOUNTER — Other Ambulatory Visit: Payer: Self-pay

## 2018-01-14 ENCOUNTER — Emergency Department
Admission: EM | Admit: 2018-01-14 | Discharge: 2018-01-15 | Disposition: A | Discharge status: Home / Self Care | Payer: Medicaid Other | Attending: Emergency Medicine | Admitting: Emergency Medicine

## 2018-01-14 DIAGNOSIS — R21 Rash and other nonspecific skin eruption: Secondary | ICD-10-CM | POA: Insufficient documentation

## 2018-01-14 DIAGNOSIS — J029 Acute pharyngitis, unspecified: Secondary | ICD-10-CM | POA: Diagnosis not present

## 2018-01-14 DIAGNOSIS — R102 Pelvic and perineal pain: Secondary | ICD-10-CM | POA: Insufficient documentation

## 2018-01-14 DIAGNOSIS — N938 Other specified abnormal uterine and vaginal bleeding: Secondary | ICD-10-CM | POA: Diagnosis not present

## 2018-01-14 DIAGNOSIS — R52 Pain, unspecified: Secondary | ICD-10-CM

## 2018-01-14 DIAGNOSIS — Z79899 Other long term (current) drug therapy: Secondary | ICD-10-CM | POA: Diagnosis not present

## 2018-01-14 DIAGNOSIS — Z8571 Personal history of Hodgkin lymphoma: Secondary | ICD-10-CM | POA: Diagnosis not present

## 2018-01-14 MED ORDER — DIPHENHYDRAMINE HCL 50 MG/ML IJ SOLN
25.0000 mg | Freq: Once | INTRAMUSCULAR | Status: AC
  Administered 2018-01-15: 25 mg via INTRAMUSCULAR
  Filled 2018-01-14: qty 1

## 2018-01-14 NOTE — ED Provider Notes (Signed)
[facility] Emergency Department Provider Note   ____________________________________________   First MD Initiated Contact with Patient 01/14/18 2347     (approximate)  I have reviewed the triage vital signs and the nursing notes.   HISTORY  Chief Complaint Rash and Abdominal Pain    HPI Michele Alexander is a 22 y.o. female who presents to the ED from home with multiple medical complaints.  Patient noted itchy rash which started on her face and spread to her trunk and extremities today.  Has a new puppy but states she is not allergic to fleas even of the dog has them.  Denies recent exposures such as new foods, medicines, environmental.  Boyfriend recently had strep in her throat is sore.  Complains of pelvic pain and vaginal bleeding.  Just finished her period 4 days ago.  History of adenomyosis.  Supposed to start birth control patch.  Denies fever, chills, chest pain, shortness of breath, abdominal pain, nausea, vomiting, diarrhea.  Denies recent travel or trauma.   Past Medical History:  Diagnosis Date  . Anemia   . Anxiety   . Cancer ([place])    Hodgkin's Lymphoma  . Depression   . Hodgkin's disease(201)   . Sickle cell anemia (HCC)    traits    Patient Active Problem List   Diagnosis Date Noted  . Pregnancy, supervision, high-risk, first trimester 04/23/2015  . History of Hodgkin's lymphoma 04/23/2015  . Sickle cell trait ([place]) 04/23/2015  . Nausea & vomiting 01/14/2015    Past Surgical History:  Procedure Laterality Date  . DILATION AND EVACUATION N/A 12/21/2015   Procedure: DILATATION AND EVACUATION;  Surgeon: Will Bonnet, MD;  Location: [facility] ORS;  Service: Gynecology;  Laterality: N/A;  . NECK LESION BIOPSY    . OTHER SURGICAL HISTORY N/A 2015   Endoscopy  . PORT-A-CATH REMOVAL    . PORTACATH PLACEMENT Left 2010    Prior to Admission medications   Medication Sig Start Date End Date Taking? Authorizing Provider    amoxicillin (AMOXIL) 500 MG capsule Take 1 capsule (500 mg total) by mouth 3 (three) times daily. 01/15/18   Paulette Blanch, MD  HYDROcodone-acetaminophen (NORCO) 5-325 MG tablet Take 1 tablet by mouth every 6 (six) hours as needed for moderate pain. 04/05/17   Carrie Mew, MD  medroxyPROGESTERone (DEPO-PROVERA) 150 MG/ML injection Inject 1 mL into the muscle every 3 (three) months.    [provider]  methylPREDNISolone (MEDROL DOSEPAK) 4 MG TBPK tablet Take as directed 01/15/18   Paulette Blanch, MD  naproxen (NAPROSYN) 500 MG tablet Take 1 tablet (500 mg total) by mouth 2 (two) times daily with a meal. 04/05/17   Carrie Mew, MD  norgestimate-ethinyl estradiol ([place] 28) 0.25-35 MG-MCG tablet Take 1 tablet three times a day for 3 days, then Take 1 tablet two times a day for 3 days, then Take 1 tablet one time a day for 3 days, then discontinue (total regimen 18 tablets) 04/05/17   Carrie Mew, MD  ondansetron (ZOFRAN ODT) 4 MG disintegrating tablet Take 1 tablet (4 mg total) by mouth every 8 (eight) hours as needed for nausea or vomiting. 03/13/17   Darel Hong, MD  PARoxetine (PAXIL) 40 MG tablet Take 40 mg by mouth every morning.    [provider]  Prenatal Vit-Fe Fumarate-FA (PRENATAL VITAMIN PO) Take by mouth.    [provider]  promethazine (PHENERGAN) 25 MG tablet Take 25 mg by mouth every 6 (six) hours  as needed for nausea or vomiting.    [provider]    Allergies Other; Diatrizoate; Iodinated diagnostic agents; Nexium [esomeprazole magnesium]; and Cheese  Family History  Problem Relation Age of Onset  . Hypertension Mother   . Diabetes Maternal Grandfather   . Hypertension Maternal Grandfather   . Cancer Maternal Grandmother   . Cancer Paternal Grandfather     Social History Social History   Tobacco Use  . Smoking status: Never Smoker  . Smokeless tobacco: Never Used  Substance Use Topics  . Alcohol use: No  .  Drug use: No    Review of Systems  Constitutional: No fever/chills Eyes: No visual changes. ENT: Positive for sore throat. Cardiovascular: Denies chest pain. Respiratory: Denies shortness of breath. Gastrointestinal: Positive for pelvic pain.  No abdominal pain.  No nausea, no vomiting.  No diarrhea.  No constipation. Genitourinary: Positive for abnormal vaginal bleeding.  Negative for dysuria. Musculoskeletal: Negative for back pain. Skin: Negative for rash. Neurological: Negative for headaches, focal weakness or numbness.   ____________________________________________   PHYSICAL EXAM:  VITAL SIGNS: ED Triage Vitals  Enc Vitals Group     BP 01/14/18 2339 120/81     Pulse Rate 01/14/18 2339 89     Resp 01/14/18 2339 18     Temp 01/14/18 2339 97.9 F (36.6 C)     Temp Source 01/14/18 2339 Oral     SpO2 01/14/18 2339 100 %     Weight 01/14/18 2339 83 lb (37.6 kg)     Height 01/14/18 2339 _0  (1.499 m)     Head Circumference --      Peak Flow --      Pain Score 01/14/18 2344 6     Pain Loc --      Pain Edu? --      Excl. in [place]? --     Constitutional: Alert and oriented. Well appearing and in no acute distress. Eyes: Conjunctivae are normal. PERRL. EOMI. Head: Atraumatic. Nose: No congestion/rhinnorhea. Mouth/Throat: Mucous membranes are moist.  Oropharynx mildly erythematous without tonsillar swelling, exudates or peritonsillar abscess.  There is no hoarse or muffled voice.  There is no drooling. Neck: No stridor.   Cardiovascular: Normal rate, regular rhythm. Grossly normal heart sounds.  Good peripheral circulation. Respiratory: Normal respiratory effort.  No retractions. Lungs CTAB. Gastrointestinal: Soft and nontender to light or deep palpation. No distention. No abdominal bruits. No CVA tenderness. Musculoskeletal: No lower extremity tenderness nor edema.  No joint effusions. Neurologic:  Normal speech and language. No gross focal neurologic deficits are  appreciated. No gait instability. Skin:  Skin is warm, dry and intact.  Diffuse, pruritic maculopapular rash.  No petechiae.   Psychiatric: Mood and affect are normal. Speech and behavior are normal.  ____________________________________________   LABS (all labs ordered are listed, but only abnormal results are displayed)  Labs Reviewed  WET PREP, GENITAL - Abnormal; Notable for the following components:      Result Value   WBC, Wet Prep HPF POC RARE (*)    All other components within normal limits  CBC WITH DIFFERENTIAL/PLATELET - Abnormal; Notable for the following components:   Hemoglobin 11.7 (*)    MCV 75.6 (*)    MCH 25.1 (*)    RDW 14.7 (*)    All other components within normal limits  BASIC METABOLIC PANEL - Abnormal; Notable for the following components:   Potassium 3.2 (*)    All other components within normal limits  URINALYSIS,  COMPLETE (UACMP) WITH MICROSCOPIC - Abnormal; Notable for the following components:   Color, Urine YELLOW (*)    APPearance HAZY (*)    Hgb urine dipstick MODERATE (*)    Protein, ur 30 (*)    RBC / HPF >50 (*)    All other components within normal limits  GROUP A STREP BY PCR  CHLAMYDIA/NGC RT PCR ([facility] ONLY)  POC URINE PREG, ED   ____________________________________________  EKG  None ____________________________________________  RADIOLOGY  ED MD interpretation: Unremarkable pelvic ultrasound  Official radiology report(s): US Pelvis Transvanginal Non-ob (tv Only)  Result Date: 01/15/2018 CLINICAL DATA:  Acute onset of pelvic pain. EXAM: TRANSABDOMINAL AND TRANSVAGINAL ULTRASOUND OF PELVIS DOPPLER ULTRASOUND OF OVARIES TECHNIQUE: Both transabdominal and transvaginal ultrasound examinations of the pelvis were performed. Transabdominal technique was performed for global imaging of the pelvis including uterus, ovaries, adnexal regions, and pelvic cul-de-sac. It was necessary to proceed with endovaginal exam following the  transabdominal exam to visualize the uterus and ovaries in greater detail. Color and duplex Doppler ultrasound was utilized to evaluate blood flow to the ovaries. COMPARISON:  CT of the abdomen and pelvis from 03/13/2017, and pelvic ultrasound performed 10/04/2016 FINDINGS: Uterus Measurements: 7.0 x 3.6 x 4.9 cm. No fibroids or other mass visualized. Endometrium Thickness: 0.8 cm.  Trace fluid is noted at the cervical canal. Right ovary Measurements: 3.4 x 2.5 x 2.2 cm. Normal appearance/no adnexal mass. Left ovary Measurements: 3.2 x 1.8 x 2.4 cm. Normal appearance/no adnexal mass. Pulsed Doppler evaluation of both ovaries demonstrates normal low-resistance arterial and venous waveforms. Other findings No free fluid is seen within the pelvic cul-de-sac. IMPRESSION: Unremarkable pelvic ultrasound.  No evidence for ovarian torsion. Electronically Signed   By: Garald Balding M.D.   On: 01/15/2018 02:38   US Pelvis Complete  Result Date: 01/15/2018 CLINICAL DATA:  Acute onset of pelvic pain. EXAM: TRANSABDOMINAL AND TRANSVAGINAL ULTRASOUND OF PELVIS DOPPLER ULTRASOUND OF OVARIES TECHNIQUE: Both transabdominal and transvaginal ultrasound examinations of the pelvis were performed. Transabdominal technique was performed for global imaging of the pelvis including uterus, ovaries, adnexal regions, and pelvic cul-de-sac. It was necessary to proceed with endovaginal exam following the transabdominal exam to visualize the uterus and ovaries in greater detail. Color and duplex Doppler ultrasound was utilized to evaluate blood flow to the ovaries. COMPARISON:  CT of the abdomen and pelvis from 03/13/2017, and pelvic ultrasound performed 10/04/2016 FINDINGS: Uterus Measurements: 7.0 x 3.6 x 4.9 cm. No fibroids or other mass visualized. Endometrium Thickness: 0.8 cm.  Trace fluid is noted at the cervical canal. Right ovary Measurements: 3.4 x 2.5 x 2.2 cm. Normal appearance/no adnexal mass. Left ovary Measurements: 3.2 x 1.8 x  2.4 cm. Normal appearance/no adnexal mass. Pulsed Doppler evaluation of both ovaries demonstrates normal low-resistance arterial and venous waveforms. Other findings No free fluid is seen within the pelvic cul-de-sac. IMPRESSION: Unremarkable pelvic ultrasound.  No evidence for ovarian torsion. Electronically Signed   By: Garald Balding M.D.   On: 01/15/2018 02:38   US Pelvic Doppler (torsion R/o Or Mass Arterial Flow)  Result Date: 01/15/2018 CLINICAL DATA:  Acute onset of pelvic pain. EXAM: TRANSABDOMINAL AND TRANSVAGINAL ULTRASOUND OF PELVIS DOPPLER ULTRASOUND OF OVARIES TECHNIQUE: Both transabdominal and transvaginal ultrasound examinations of the pelvis were performed. Transabdominal technique was performed for global imaging of the pelvis including uterus, ovaries, adnexal regions, and pelvic cul-de-sac. It was necessary to proceed with endovaginal exam following the transabdominal exam to visualize the uterus and ovaries in greater  detail. Color and duplex Doppler ultrasound was utilized to evaluate blood flow to the ovaries. COMPARISON:  CT of the abdomen and pelvis from 03/13/2017, and pelvic ultrasound performed 10/04/2016 FINDINGS: Uterus Measurements: 7.0 x 3.6 x 4.9 cm. No fibroids or other mass visualized. Endometrium Thickness: 0.8 cm.  Trace fluid is noted at the cervical canal. Right ovary Measurements: 3.4 x 2.5 x 2.2 cm. Normal appearance/no adnexal mass. Left ovary Measurements: 3.2 x 1.8 x 2.4 cm. Normal appearance/no adnexal mass. Pulsed Doppler evaluation of both ovaries demonstrates normal low-resistance arterial and venous waveforms. Other findings No free fluid is seen within the pelvic cul-de-sac. IMPRESSION: Unremarkable pelvic ultrasound.  No evidence for ovarian torsion. Electronically Signed   By: Garald Balding M.D.   On: 01/15/2018 02:38    ____________________________________________   PROCEDURES  Procedure(s) performed:   Pelvic exam: External exam within normal limits  without rashes, lesions or vesicles.  Speculum exam reveals mild vaginal bleeding.  Cervical os is closed.  Bimanual exam within normal limits.  Procedures  Critical Care performed: No  ____________________________________________   INITIAL IMPRESSION / ASSESSMENT AND PLAN / ED COURSE  As part of my medical decision making, I reviewed the following data within the [facility] notes reviewed and incorporated, Labs reviewed, Radiograph reviewed and Notes from prior ED visits   22 year old female who presents with a myriad of complaints.  Will obtain screening lab work, urinalysis, rapid strep and pelvic ultrasound.  Clinical Course as of Jan 16 451  Fri Jan 15, 2018  0250 Updated patient on all test results.  Will place on steroid taper which will also help her erythematous throat, antibiotic for pharyngitis and she will follow-up with GYN for DUB.  Strict return precautions given.  Patient verbalizes understanding and agrees with plan of care.   [JS]    Clinical Course User Index [JS] Paulette Blanch, MD     ____________________________________________   FINAL CLINICAL IMPRESSION(S) / ED DIAGNOSES  Final diagnoses:  Pain  Rash  DUB (dysfunctional uterine bleeding)  Pharyngitis, unspecified etiology     ED Discharge Orders         Ordered    amoxicillin (AMOXIL) 500 MG capsule  3 times daily     01/15/18 0252    methylPREDNISolone (MEDROL DOSEPAK) 4 MG TBPK tablet     01/15/18 0252           Note:  This document was prepared using Dragon voice recognition software and may include unintentional dictation errors.    Paulette Blanch, MD 01/15/18 [phone]

## 2018-01-14 NOTE — ED Triage Notes (Signed)
Pt in with co rash, sore throat, congestion, abd pain, and vaginal bleeding.

## 2018-01-15 ENCOUNTER — Emergency Department: Payer: Medicaid Other

## 2018-01-15 LAB — URINALYSIS, COMPLETE (UACMP) WITH MICROSCOPIC
BILIRUBIN URINE: NEGATIVE
Bacteria, UA: NONE SEEN
Glucose, UA: NEGATIVE mg/dL
KETONES UR: NEGATIVE mg/dL
Leukocytes, UA: NEGATIVE
Nitrite: NEGATIVE
Protein, ur: 30 mg/dL — AB
SPECIFIC GRAVITY, URINE: 1.02 (ref 1.005–1.030)
pH: 7 (ref 5.0–8.0)

## 2018-01-15 LAB — WET PREP, GENITAL
Clue Cells Wet Prep HPF POC: NONE SEEN
Sperm: NONE SEEN
Trich, Wet Prep: NONE SEEN
Yeast Wet Prep HPF POC: NONE SEEN

## 2018-01-15 LAB — BASIC METABOLIC PANEL
Anion gap: 5 (ref 5–15)
BUN: 10 mg/dL (ref 6–20)
CO2: 25 mmol/L (ref 22–32)
Calcium: 9.1 mg/dL (ref 8.9–10.3)
Chloride: 109 mmol/L (ref 98–111)
Creatinine, Ser: 0.73 mg/dL (ref 0.44–1.00)
GFR calc Af Amer: >60 mL/min (ref >60)
GLUCOSE: 91 mg/dL (ref 70–99)
POTASSIUM: 3.2 mmol/L — AB (ref 3.5–5.1)
Sodium: 139 mmol/L (ref 135–145)

## 2018-01-15 LAB — CBC WITH DIFFERENTIAL/PLATELET
Basophils Absolute: 0 10*3/uL (ref 0–0.1)
Basophils Relative: 1 %
EOS ABS: 0.1 10*3/uL (ref 0–0.7)
EOS PCT: 2 %
HCT: 35.1 % (ref 35.0–47.0)
Hemoglobin: 11.7 g/dL — ABNORMAL LOW (ref 12.0–16.0)
LYMPHS PCT: 31 %
Lymphs Abs: 1.6 10*3/uL (ref 1.0–3.6)
MCH: 25.1 pg — ABNORMAL LOW (ref 26.0–34.0)
MCHC: 33.3 g/dL (ref 32.0–36.0)
MCV: 75.6 fL — AB (ref 80.0–100.0)
MONO ABS: 0.4 10*3/uL (ref 0.2–0.9)
MONOS PCT: 8 %
Neutro Abs: 3 10*3/uL (ref 1.4–6.5)
Neutrophils Relative %: 58 %
PLATELETS: 413 10*3/uL (ref 150–440)
RBC: 4.65 MIL/uL (ref 3.80–5.20)
RDW: 14.7 % — ABNORMAL HIGH (ref 11.5–14.5)
WBC: 5.1 10*3/uL (ref 3.6–11.0)

## 2018-01-15 LAB — CHLAMYDIA/NGC RT PCR (ARMC ONLY)
CHLAMYDIA TR: NOT DETECTED
N gonorrhoeae: NOT DETECTED

## 2018-01-15 LAB — GROUP A STREP BY PCR: Group A Strep by PCR: NOT DETECTED

## 2018-01-15 MED ORDER — AMOXICILLIN 500 MG PO CAPS: 500.0000 mg | ORAL_CAPSULE | Freq: Three times a day (TID) | ORAL | 0 refills | Status: DC

## 2018-01-15 MED ORDER — AMOXICILLIN 500 MG PO CAPS
500.0000 mg | ORAL_CAPSULE | Freq: Once | ORAL | Status: AC
  Administered 2018-01-15: 500 mg via ORAL
  Filled 2018-01-15: qty 1

## 2018-01-15 MED ORDER — METHYLPREDNISOLONE 4 MG PO TBPK: ORAL_TABLET | ORAL | 0 refills | Status: DC

## 2018-01-15 NOTE — Discharge Instructions (Addendum)
1.  Take antibiotic as prescribed (amoxicillin 500 mg 3 times daily x7 days). 2.  Take steroid taper as prescribed (Medrol Dosepak). 3.  You may take Benadryl as needed for itching. 4.  Return to the ER for worsening symptoms, persistent vomiting, difficulty breathing or other concerns.

## 2018-01-15 NOTE — ED Notes (Signed)
URINE PREG POC= NEGATIVE

## 2018-02-04 ENCOUNTER — Encounter: Payer: Self-pay | Admitting: Obstetrics and Gynecology

## 2018-02-04 ENCOUNTER — Ambulatory Visit: Payer: Medicaid Other | Admitting: Obstetrics and Gynecology

## 2018-02-04 ENCOUNTER — Other Ambulatory Visit: Payer: Self-pay

## 2018-02-04 VITALS — BP 114/79 | HR 93 | Ht 59.0 in | Wt 88.0 lb

## 2018-02-04 DIAGNOSIS — N96 Recurrent pregnancy loss: Secondary | ICD-10-CM | POA: Diagnosis not present

## 2018-02-04 NOTE — Progress Notes (Signed)
Patient ID: Michele Alexander, female   DOB: Apr 07, 1996, 22 y.o.   MRN: [id number]   [facility] Visit  @DATE @            Patient name: Michele Alexander MRN [id number]  Date of birth: 22-Jun-1995  CC & BTD:HRCBULAGT discussion  Michele Alexander is a 22 y.o. female NEW PT presenting today to discuss conception. 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 She was 18 at the time of her first pregnancy, which was successful. She lost her other pregnancies at 13wks, 7wks, 5wks, 3wks, and less than 3 wks.   She had 2 periods this month, the first was July 29-August 1 and was heavy like her normal periods. The second was August 5-9 and was very light. She is due for her period now but it has not come yet so she took 3 pregnancy tests and they all came back positive. She then went to the [facility] about a week ago for a pregnancy test but it was negative. The patient denies fever, chills or any other symptoms or complaints at this time.   ROS:  ROS - fever - chills All systems are negative except as noted in the HPI and PMH.   Pertinent History Reviewed:   Reviewed:  Medical         Past Medical History:  Diagnosis Date  . Anemia   . Anxiety   . Cancer ([place])    Hodgkin's Lymphoma  . Depression   . Hodgkin's disease(201)   . Sickle cell anemia (HCC)    traits                              Surgical Hx:    Past Surgical History:  Procedure Laterality Date  . DILATION AND EVACUATION N/A 12/21/2015   Procedure: DILATATION AND EVACUATION;  Surgeon: Will Bonnet, MD;  Location: [facility] ORS;  Service: Gynecology;  Laterality: N/A;  . NECK LESION BIOPSY    . OTHER SURGICAL HISTORY N/A 2015   Endoscopy  . PORT-A-CATH REMOVAL    . PORTACATH PLACEMENT Left 2010   Medications: Reviewed & Updated - see associated section                       Current Outpatient Medications:  .  Prenatal Vit-Fe Fumarate-FA (PRENATAL VITAMIN PO), Take  by mouth., Disp: , Rfl:   Social History: Reviewed -  reports that she has never smoked. She has never used smokeless tobacco.  Objective Findings:  Vitals: Blood pressure 114/79, pulse 93, height 4\' 11"  (1.499 m), weight 88 lb (39.9 kg), last menstrual period 01/11/2018, unknown if currently breastfeeding.  PHYSICAL EXAMINATION General appearance - alert, well appearing, and in no distress, oriented to person, place, and time and normal appearing weight Mental status - alert, oriented to person, place, and time, normal mood, behavior, speech, dress, motor activity, and thought processes, affect appropriate to mood  PELVIC DEFERRED  Assessment & Plan:   A:  1. 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 F/U in 4wks for exam and serum progesterone, TSH on cycle day 21--- pt will call when her menstrual period starts 2. Semen analysis for husband at [facility] if desired  By signing my name below, I, De Burrs, attest that this documentation has been prepared  under the direction and in the presence of Jonnie Kind, MD. Electronically Signed: De Burrs, Medical Scribe. 02/04/18. 10:40 AM.  I personally performed the services described in this documentation, which was SCRIBED in my presence. The recorded information has been reviewed and considered accurate. It has been edited as necessary during review. Jonnie Kind, MD

## 2018-03-03 ENCOUNTER — Ambulatory Visit: Payer: Medicaid Other | Admitting: Obstetrics and Gynecology

## 2018-03-08 ENCOUNTER — Other Ambulatory Visit: Payer: Medicaid Other

## 2018-03-08 DIAGNOSIS — N96 Recurrent pregnancy loss: Secondary | ICD-10-CM

## 2018-03-09 LAB — TSH: TSH: 2.1 u[IU]/mL (ref 0.450–4.500)

## 2018-03-09 LAB — PROGESTERONE: PROGESTERONE: 0.3 ng/mL

## 2018-03-14 ENCOUNTER — Emergency Department (HOSPITAL_COMMUNITY)
Admission: EM | Admit: 2018-03-14 | Discharge: 2018-03-14 | Disposition: A | Discharge status: Home / Self Care | Payer: Medicaid Other | Attending: Emergency Medicine | Admitting: Emergency Medicine

## 2018-03-14 ENCOUNTER — Emergency Department (HOSPITAL_COMMUNITY): Payer: Medicaid Other

## 2018-03-14 ENCOUNTER — Encounter (HOSPITAL_COMMUNITY): Payer: Self-pay | Admitting: Emergency Medicine

## 2018-03-14 ENCOUNTER — Other Ambulatory Visit: Payer: Self-pay

## 2018-03-14 DIAGNOSIS — J019 Acute sinusitis, unspecified: Secondary | ICD-10-CM | POA: Insufficient documentation

## 2018-03-14 DIAGNOSIS — Z79899 Other long term (current) drug therapy: Secondary | ICD-10-CM | POA: Diagnosis not present

## 2018-03-14 DIAGNOSIS — R05 Cough: Secondary | ICD-10-CM | POA: Diagnosis present

## 2018-03-14 HISTORY — DX: Sickle-cell trait: D57.3

## 2018-03-14 MED ORDER — AMOXICILLIN-POT CLAVULANATE 875-125 MG PO TABS: 1.0000 | ORAL_TABLET | Freq: Two times a day (BID) | ORAL | 0 refills | Status: DC

## 2018-03-14 NOTE — Discharge Instructions (Addendum)
If you develop high fever, vomiting, trouble breathing, or any other new/concerning symptoms then return to the ER for evaluation.

## 2018-03-14 NOTE — ED Triage Notes (Signed)
Patient c/o bilateral eye yellow, greenish drainage x3 days. Patient also c/o facial pressure, sinus congestion, and nasal drainage that started yesterday. Per patient low grade fever of 99. Productive cough with thick yellow-green sputum

## 2018-03-14 NOTE — ED Provider Notes (Signed)
[facility] EMERGENCY DEPARTMENT Provider Note   CSN: [id number] Arrival date & time: 03/14/18  1224     History   Chief Complaint Chief Complaint  Patient presents with  . Eye Drainage    HPI Michele Alexander is a 22 y.o. female.     22 year old female with a history of treated Hodgkin's lymphoma presents with a chief complaint of cough and concern for sinus infection.  She states that since 9/19 she is been having respiratory symptoms including cough and nasal congestion.  She states that it seemed to get slightly better but then over the last few days is gotten worse.  She is having productive cough and increased congestion out of her nose.  She states that it is yellow during the day and green at night.  There are no fevers.  She has a long-standing history of recurrent pinkeye and states she has recurrent pinkeye today.  For the last 3 days she is been having matting of her bilateral eyelids when she wakes up and eye itching and redness.  She is also had a sore throat.  Vomited a couple days ago but none today or recently.  There is no shortness of breath.  Past Medical History:  Diagnosis Date  . Anemia   . Anxiety   . Cancer ([place])    Hodgkin's Lymphoma  . Depression   . Hodgkin's disease(201)   . Sickle cell trait ([place])    traits    Patient Active Problem List   Diagnosis Date Noted  . Pregnancy, supervision, high-risk, first trimester 04/23/2015  . History of Hodgkin's lymphoma 04/23/2015  . Sickle cell trait ([place]) 04/23/2015  . Nausea & vomiting 01/14/2015    Past Surgical History:  Procedure Laterality Date  . DILATION AND EVACUATION N/A 12/21/2015   Procedure: DILATATION AND EVACUATION;  Surgeon: Will Bonnet, MD;  Location: [facility] ORS;  Service: Gynecology;  Laterality: N/A;  . NECK LESION BIOPSY    . OTHER SURGICAL HISTORY N/A 2015   Endoscopy  . PORT-A-CATH REMOVAL    . PORTACATH PLACEMENT Left 2010     OB History    Gravida  5   Para  1   Term   1   Preterm  0   AB  4   Living  1     SAB  4   TAB  0   Ectopic  0   Multiple  0   Live Births  1            Home Medications    Prior to Admission medications   Medication Sig Start Date End Date Taking? Authorizing Provider  amoxicillin-clavulanate (AUGMENTIN) 875-125 MG tablet Take 1 tablet by mouth 2 (two) times daily. One po bid x 7 days 03/14/18   Sherwood Gambler, MD  Prenatal Vit-Fe Fumarate-FA (PRENATAL VITAMIN PO) Take by mouth.    [provider]    Family History Family History  Problem Relation Age of Onset  . Hypertension Mother   . Diabetes Maternal Grandfather   . Hypertension Maternal Grandfather   . Cancer Maternal Grandfather   . Cancer Maternal Grandmother     Social History Social History   Tobacco Use  . Smoking status: Never Smoker  . Smokeless tobacco: Never Used  Substance Use Topics  . Alcohol use: No  . Drug use: No     Allergies   Other; Diatrizoate; Iodinated diagnostic agents; Nexium [esomeprazole magnesium]; and Cheese   Review of Systems Review  of Systems  Constitutional: Negative for fever.  HENT: Positive for congestion, rhinorrhea, sinus pressure and sore throat.   Eyes: Positive for discharge, redness and itching.  Respiratory: Positive for cough. Negative for shortness of breath.   All other systems reviewed and are negative.    Physical Exam Updated Vital Signs BP 123/71 (BP Location: Right Arm)   Pulse 85   Temp 97.7 F (36.5 C) (Oral)   Resp 16   Ht 4\' 11"  (1.499 m)   Wt 38.6 kg   LMP 03/09/2018   SpO2 100%   BMI 17.17 kg/m   Physical Exam  Constitutional: She appears well-developed and well-nourished. No distress.  HENT:  Head: Normocephalic and atraumatic.  Right Ear: External ear normal.  Left Ear: External ear normal.  Nose: Nose normal.  Mouth/Throat: No oropharyngeal exudate.  Eyes: Right eye exhibits no discharge. Left eye exhibits no discharge.  There is no eye drainage  or periorbital swelling.  There is possibly some mild conjunctival injection but this is very minimal and could be normal variant for her.  No diffuse redness consistent with typical conjunctivitis.  Neck: Neck supple.  Cardiovascular: Normal rate, regular rhythm and normal heart sounds.  Pulmonary/Chest: Effort normal and breath sounds normal. She has no wheezes. She has no rales.  Abdominal: She exhibits no distension.  Neurological: She is alert.  Skin: Skin is warm and dry. She is not diaphoretic.  Psychiatric: Her mood appears not anxious.  Nursing note and vitals reviewed.    ED Treatments / Results  Labs (all labs ordered are listed, but only abnormal results are displayed) Labs Reviewed - No data to display  EKG None  Radiology Dg Chest 2 View  Result Date: 03/14/2018 CLINICAL DATA:  Fever, cough EXAM: CHEST - 2 VIEW COMPARISON:  02/17/2013 Heart and mediastinal contours are within normal limits. No focal opacities or effusions. No acute bony abnormality. FINDINGS: The heart size and mediastinal contours are within normal limits. Both lungs are clear. The visualized skeletal structures are unremarkable. IMPRESSION: No active cardiopulmonary disease. Electronically Signed   By: Rolm Baptise M.D.   On: 03/14/2018 14:19    Procedures Procedures (including critical care time)  Medications Ordered in ED Medications - No data to display   Initial Impression / Assessment and Plan / ED Course  I have reviewed the triage vital signs and the nursing notes.  Pertinent labs & imaging results that were available during my care of the patient were reviewed by me and considered in my medical decision making (see chart for details).     Given the worsening cough and congestion after what was presumably a viral illness, chest x-ray ordered which shows no pneumonia.  However, I am concerned about bacterial sinusitis in this scenario and so she has been placed on Augmentin.  I do not see  any significant conjunctivitis.  Overall her vital signs are stable and she appears stable to follow-up with outpatient PCP.  Final Clinical Impressions(s) / ED Diagnoses   Final diagnoses:  Acute sinusitis, recurrence not specified, unspecified location    ED Discharge Orders         Ordered    amoxicillin-clavulanate (AUGMENTIN) 875-125 MG tablet  2 times daily     03/14/18 1440           Sherwood Gambler, MD 03/14/18 2058

## 2018-03-14 NOTE — ED Notes (Signed)
From Rad 

## 2018-03-14 NOTE — ED Notes (Signed)
Pt reports history of pink eye repetitively as a child Reports 3 day history of crusty itchy eyes upon awakening as well as a sinus infection of 3 weeks  She is a non smoker  Pt sniffing and coughing, sclera is slightly red to both eyes no drainage noted Pt has no physician as she has just moved to town

## 2018-03-14 NOTE — ED Notes (Signed)
Visual acuity:  OD  20/50 OS  20/50 OU 20/50

## 2018-03-28 ENCOUNTER — Other Ambulatory Visit: Payer: Self-pay

## 2018-03-28 ENCOUNTER — Encounter (HOSPITAL_COMMUNITY): Payer: Self-pay | Admitting: Emergency Medicine

## 2018-03-28 ENCOUNTER — Emergency Department (HOSPITAL_COMMUNITY): Payer: Medicaid Other

## 2018-03-28 ENCOUNTER — Emergency Department (HOSPITAL_COMMUNITY)
Admission: EM | Admit: 2018-03-28 | Discharge: 2018-03-28 | Disposition: A | Discharge status: Home / Self Care | Payer: Medicaid Other | Attending: Emergency Medicine | Admitting: Emergency Medicine

## 2018-03-28 DIAGNOSIS — R102 Pelvic and perineal pain: Secondary | ICD-10-CM | POA: Insufficient documentation

## 2018-03-28 DIAGNOSIS — D573 Sickle-cell trait: Secondary | ICD-10-CM | POA: Insufficient documentation

## 2018-03-28 DIAGNOSIS — Z79899 Other long term (current) drug therapy: Secondary | ICD-10-CM | POA: Insufficient documentation

## 2018-03-28 DIAGNOSIS — R112 Nausea with vomiting, unspecified: Secondary | ICD-10-CM | POA: Insufficient documentation

## 2018-03-28 LAB — URINALYSIS, ROUTINE W REFLEX MICROSCOPIC
Bilirubin Urine: NEGATIVE
Glucose, UA: NEGATIVE mg/dL
Hgb urine dipstick: NEGATIVE
KETONES UR: NEGATIVE mg/dL
LEUKOCYTES UA: NEGATIVE
NITRITE: NEGATIVE
PROTEIN: NEGATIVE mg/dL
Specific Gravity, Urine: 1.019 (ref 1.005–1.030)
pH: 8 (ref 5.0–8.0)

## 2018-03-28 LAB — CBC
HEMATOCRIT: 34.9 % — AB (ref 36.0–46.0)
Hemoglobin: 10.6 g/dL — ABNORMAL LOW (ref 12.0–15.0)
MCH: 22.6 pg — ABNORMAL LOW (ref 26.0–34.0)
MCHC: 30.4 g/dL (ref 30.0–36.0)
MCV: 74.4 fL — AB (ref 80.0–100.0)
Platelets: 592 10*3/uL — ABNORMAL HIGH (ref 150–400)
RBC: 4.69 MIL/uL (ref 3.87–5.11)
RDW: 15.7 % — AB (ref 11.5–15.5)
WBC: 6.3 10*3/uL (ref 4.0–10.5)
nRBC: 0 % (ref 0.0–0.2)

## 2018-03-28 LAB — COMPREHENSIVE METABOLIC PANEL
ALBUMIN: 3.6 g/dL (ref 3.5–5.0)
ALT: 29 U/L (ref 0–44)
AST: 29 U/L (ref 15–41)
Alkaline Phosphatase: 129 U/L — ABNORMAL HIGH (ref 38–126)
Anion gap: 9 (ref 5–15)
BUN: 10 mg/dL (ref 6–20)
CO2: 23 mmol/L (ref 22–32)
CREATININE: 0.62 mg/dL (ref 0.44–1.00)
Calcium: 8.8 mg/dL — ABNORMAL LOW (ref 8.9–10.3)
Chloride: 103 mmol/L (ref 98–111)
GFR calc Af Amer: >60 mL/min (ref >60)
GFR calc non Af Amer: >60 mL/min (ref >60)
GLUCOSE: 95 mg/dL (ref 70–99)
POTASSIUM: 3.5 mmol/L (ref 3.5–5.1)
Sodium: 135 mmol/L (ref 135–145)
Total Bilirubin: 0.5 mg/dL (ref 0.3–1.2)
Total Protein: 7.7 g/dL (ref 6.5–8.1)

## 2018-03-28 LAB — WET PREP, GENITAL
CLUE CELLS WET PREP: NONE SEEN
Sperm: NONE SEEN
TRICH WET PREP: NONE SEEN
YEAST WET PREP: NONE SEEN

## 2018-03-28 LAB — I-STAT BETA HCG BLOOD, ED (MC, WL, AP ONLY): I-stat hCG, quantitative: 5.4 m[IU]/mL — ABNORMAL HIGH (ref <5)

## 2018-03-28 LAB — HCG, QUANTITATIVE, PREGNANCY: hCG, Beta Chain, Quant, S: <1 m[IU]/mL (ref <5)

## 2018-03-28 MED ORDER — NAPROXEN 375 MG PO TABS: 375.0000 mg | ORAL_TABLET | Freq: Two times a day (BID) | ORAL | 0 refills | Status: DC

## 2018-03-28 MED ORDER — ONDANSETRON HCL 4 MG PO TABS: 4.0000 mg | ORAL_TABLET | Freq: Three times a day (TID) | ORAL | 0 refills | Status: DC | PRN

## 2018-03-28 NOTE — ED Triage Notes (Signed)
Patient c/o lower abd pain that started today. Patient reported taking two pregnancy tests 2 weeks ago after having constant nausea, vomiting, and sore breast in which were positive. Patient seen at health department in [place] on 10/8 and HCG levels low. 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 Denies any ectopic pregnancy. Patient denies any vaginal bleeding.

## 2018-03-28 NOTE — ED Provider Notes (Signed)
[facility] EMERGENCY DEPARTMENT Provider Note   CSN: [id number] Arrival date & time: 03/28/18  1657     History   Chief Complaint Chief Complaint  Patient presents with  . Abdominal Pain    HPI Michele Alexander is a 22 y.o. female  Has a pmh of multiple miscarriages who presents to the ER for evaluation of pelvic pain. The patient states that one month ago she  Began having daily nausea with at least one episode of vomiting, moodiness, fatigue and breast tenderness. She went to [facility] family practice where she was told that she had a barely positive pregnancy test.  She was not told or instructed to do serial examinations or have serial tracking.  She did not have an ultrasound.  Her last full menstrual period was on 2 September.  She says that she had 2 days of light bleeding on 28 of September which was abnormal for her.  She came to the ER today because she was having cramping in her lower abdomen was concerned she might be having a miscarriage.  She denies urinary symptoms, vaginal discharge, pain with intercourse.  HPI  Past Medical History:  Diagnosis Date  . Anemia   . Anxiety   . Cancer ([place])    Hodgkin's Lymphoma  . Depression   . Hodgkin's disease(201)   . Sickle cell trait ([place])    traits    Patient Active Problem List   Diagnosis Date Noted  . Pregnancy, supervision, high-risk, first trimester 04/23/2015  . History of Hodgkin's lymphoma 04/23/2015  . Sickle cell trait ([place]) 04/23/2015  . Nausea & vomiting 01/14/2015    Past Surgical History:  Procedure Laterality Date  . DILATION AND EVACUATION N/A 12/21/2015   Procedure: DILATATION AND EVACUATION;  Surgeon: Will Bonnet, MD;  Location: [facility] ORS;  Service: Gynecology;  Laterality: N/A;  . NECK LESION BIOPSY    . OTHER SURGICAL HISTORY N/A 2015   Endoscopy  . PORT-A-CATH REMOVAL    . PORTACATH PLACEMENT Left 2010     OB History    Gravida  6   Para  1   Term  1   Preterm  0   AB  4   Living  1     SAB  4   TAB  0   Ectopic  0   Multiple  0   Live Births  1            Home Medications    Prior to Admission medications   Medication Sig Start Date End Date Taking? Authorizing Provider  amoxicillin-clavulanate (AUGMENTIN) 875-125 MG tablet Take 1 tablet by mouth 2 (two) times daily. One po bid x 7 days 03/14/18   Sherwood Gambler, MD  naproxen (NAPROSYN) 375 MG tablet Take 1 tablet (375 mg total) by mouth 2 (two) times daily. 03/28/18   Brookelynn Hamor, PA-C  ondansetron (ZOFRAN) 4 MG tablet Take 1 tablet (4 mg total) by mouth every 8 (eight) hours as needed for nausea or vomiting. 03/28/18   Margarita Mail, PA-C  Prenatal Vit-Fe Fumarate-FA (PRENATAL VITAMIN PO) Take by mouth.    [provider]    Family History Family History  Problem Relation Age of Onset  . Hypertension Mother   . Diabetes Maternal Grandfather   . Hypertension Maternal Grandfather   . Cancer Maternal Grandfather   . Cancer Maternal Grandmother     Social History Social History   Tobacco Use  . Smoking status: Never Smoker  .  Smokeless tobacco: Never Used  Substance Use Topics  . Alcohol use: No  . Drug use: No     Allergies   Other; Diatrizoate; Iodinated diagnostic agents; Nexium [esomeprazole magnesium]; and Cheese   Review of Systems Review of Systems  Ten systems reviewed and are negative for acute change, except as noted in the HPI.   Physical Exam Updated Vital Signs BP 121/69 (BP Location: Right Arm)   Pulse 93   Temp 98.5 F (36.9 C) (Oral)   Resp 18   Ht '4\' 11"'  (1.499 m)   Wt 37.6 kg   LMP 03/09/2018   SpO2 100%   BMI 16.76 kg/m   Physical Exam  Constitutional: She is oriented to person, place, and time. She appears well-developed and well-nourished. No distress.  HENT:  Head: Normocephalic and atraumatic.  Eyes: Conjunctivae are normal. No scleral icterus.  Neck: Normal range of motion.  Cardiovascular: Normal rate, regular rhythm  and normal heart sounds. Exam reveals no gallop and no friction rub.  No murmur heard. Pulmonary/Chest: Effort normal and breath sounds normal. No respiratory distress.  Abdominal: Soft. Bowel sounds are normal. She exhibits no distension and no mass. There is no tenderness. There is no guarding. No hernia.  Genitourinary: Vagina normal. Uterus is not enlarged and not tender. Cervix exhibits no motion tenderness, no discharge and no friability. Right adnexum displays no mass, no tenderness and no fullness. Left adnexum displays no mass, no tenderness and no fullness. No tenderness or bleeding in the vagina. No vaginal discharge found.  Neurological: She is alert and oriented to person, place, and time.  Skin: Skin is warm and dry. She is not diaphoretic.  Psychiatric: Her behavior is normal.  Nursing note and vitals reviewed.    ED Treatments / Results  Labs (all labs ordered are listed, but only abnormal results are displayed) Labs Reviewed  WET PREP, GENITAL - Abnormal; Notable for the following components:      Result Value   WBC, Wet Prep HPF POC MODERATE (*)    All other components within normal limits  CBC - Abnormal; Notable for the following components:   Hemoglobin 10.6 (*)    HCT 34.9 (*)    MCV 74.4 (*)    MCH 22.6 (*)    RDW 15.7 (*)    Platelets 592 (*)    All other components within normal limits  COMPREHENSIVE METABOLIC PANEL - Abnormal; Notable for the following components:   Calcium 8.8 (*)    Alkaline Phosphatase 129 (*)    All other components within normal limits  URINALYSIS, ROUTINE W REFLEX MICROSCOPIC - Abnormal; Notable for the following components:   APPearance HAZY (*)    All other components within normal limits  I-STAT BETA HCG BLOOD, ED ([facility], [facility], [facility] ONLY) - Abnormal; Notable for the following components:   I-stat hCG, quantitative 5.4 (*)    All other components within normal limits  HCG, QUANTITATIVE, PREGNANCY  GC/CHLAMYDIA PROBE AMP ([place])  NOT AT [facility]    EKG None  Radiology US Ob Comp < 14 Wks  Result Date: 03/28/2018 CLINICAL DATA:  Pelvic pain. Positive pregnancy test. Gestational age by LMP of 3 weeks 1 day. EXAM: OBSTETRIC <14 WK [place] AND TRANSVAGINAL OB US TECHNIQUE: Both transabdominal and transvaginal ultrasound examinations were performed for complete evaluation of the gestation as well as the maternal uterus, adnexal regions, and pelvic cul-de-sac. Transvaginal technique was performed to assess early pregnancy. COMPARISON:  None. FINDINGS: Intrauterine gestational sac:  None Maternal uterus/adnexae: Endometrial thickness measures 9 mm. Both ovaries are normal in appearance. No mass or abnormal free fluid identified. IMPRESSION: Pregnancy of unknown anatomic location (no intrauterine gestational sac or adnexal mass identified). Differential diagnosis includes recent spontaneous abortion, IUP too early to visualize, and non-visualized ectopic pregnancy. Recommend correlation with serial beta-hCG levels, and follow up US if warranted clinically. Electronically Signed   By: Earle Gell M.D.   On: 03/28/2018 18:59   US Ob Transvaginal  Result Date: 03/28/2018 CLINICAL DATA:  Pelvic pain. Positive pregnancy test. Gestational age by LMP of 3 weeks 1 day. EXAM: OBSTETRIC <14 WK [place] AND TRANSVAGINAL OB US TECHNIQUE: Both transabdominal and transvaginal ultrasound examinations were performed for complete evaluation of the gestation as well as the maternal uterus, adnexal regions, and pelvic cul-de-sac. Transvaginal technique was performed to assess early pregnancy. COMPARISON:  None. FINDINGS: Intrauterine gestational sac: None Maternal uterus/adnexae: Endometrial thickness measures 9 mm. Both ovaries are normal in appearance. No mass or abnormal free fluid identified. IMPRESSION: Pregnancy of unknown anatomic location (no intrauterine gestational sac or adnexal mass identified). Differential diagnosis includes recent spontaneous abortion,  IUP too early to visualize, and non-visualized ectopic pregnancy. Recommend correlation with serial beta-hCG levels, and follow up US if warranted clinically. Electronically Signed   By: Earle Gell M.D.   On: 03/28/2018 18:59    Procedures Procedures (including critical care time)  Medications Ordered in ED Medications - No data to display   Initial Impression / Assessment and Plan / ED Course  I have reviewed the triage vital signs and the nursing notes.  Pertinent labs & imaging results that were available during my care of the patient were reviewed by me and considered in my medical decision making (see chart for details).  Clinical Course as of Mar 28 2258  Nancy Fetter Mar 28, 2018  2017 hCG, quantitative, pregnancy [AH]  2029 I-stat hCG, quantitative(!): 5.4 [AH]  2050 Patient i-STAT hCG barely positive.  I believe this is a false negative.  Her quantitative hCG is completely negative.  Given her estimated date of potential gestational age being either 11 weeks were about 3 weeks either 1 of these should be markedly more elevated than it is.  Given the fact that her quantitative hCG is totally negative I feel that she is not pregnant.  I had a long conversation with the patient about this.  She was extremely upset and frustrated that she had been told she was pregnant.  I tried to explain to the patient the difference between point-of-care testing and the serum quantitative hCG.  I went over her labs showed her her results and went over the values they should be.  I did my very best to explain why the point-of-care test is not as good it does not mean she is pregnant however the patient and her husband seemed mostly frustrated and stated they just wanted to go home.  She does not have any active vomiting.  Her blood counts are slightly abnormal but this is likely secondary to heavy periods.  Her alk phos is just above normal but she does not have any right upper quadrant tenderness suggestive of  gallbladder disease .  There is the ultrasound is an OB ultrasound and there is no evidence of IUP with a normal endometrial stripe I do not feel that the patient would need serial hCG levels or repeat ultrasounds for ectopic.  I discussed the case with Dr. Eulis Foster who is in agreement.  Patient  is to follow-up with her primary care physician for management of her symptoms.  She has been given naproxen and Zofran for treatment of cramps and nausea.  Discussed return precautions the patient appears appropriate for discharge at this time\   [AH]    Clinical Course User Index [AH] Margarita Mail, PA-C      Final Clinical Impressions(s) / ED Diagnoses   Final diagnoses:  Pelvic cramping  Nausea and vomiting, intractability of vomiting not specified, unspecified vomiting type    ED Discharge Orders         Ordered    ondansetron (ZOFRAN) 4 MG tablet  Every 8 hours PRN     03/28/18 2038    naproxen (NAPROSYN) 375 MG tablet  2 times daily     03/28/18 2038           Margarita Mail, PA-C 03/28/18 2259    Daleen Bo, MD 03/30/18 2042

## 2018-03-30 LAB — GC/CHLAMYDIA PROBE AMP (~~LOC~~) NOT AT ARMC
CHLAMYDIA, DNA PROBE: NEGATIVE
NEISSERIA GONORRHEA: NEGATIVE

## 2018-06-09 NOTE — L&D Delivery Note (Signed)
Obstetrical Delivery Note   Date of Delivery:   03/25/2019 Primary OB:   [facility] Gestational Age/EDD: [redacted]w[redacted]d (Dated by LMP) Antepartum complications: anemia and sickle cell trait  Delivered By:   Dalia Heading, CNM  Delivery Type:   spontaneous vaginal delivery  Procedure Details:   Mother pushed to deliver a vigorous female infant Michele Alexander) on OA over an intact perineum. Baby dried and placed on mother's abdomen. After a 3 min delay, the cord was clamped and then cut by FOB. Baby then placed skin to skin on mother's chest. Spontaneous delivery of intact placenta and 3 vessel cord. Placenta had a small accessory lobe. No vaginal, perineal or cervical lacerations seen. Anesthesia:    epidural Intrapartum complications: None GBS:    negative Laceration:    none Episiotomy:    none Placenta:    Via active 3rd stage. To pathology: no Estimated Blood Loss:  150 ml Baby:    Liveborn female, Apgars 8/9, weight pending    Dalia Heading, CNM

## 2018-07-25 ENCOUNTER — Other Ambulatory Visit: Payer: Self-pay

## 2018-07-25 ENCOUNTER — Emergency Department (HOSPITAL_COMMUNITY)
Admission: EM | Admit: 2018-07-25 | Discharge: 2018-07-26 | Disposition: A | Discharge status: Home / Self Care | Payer: Medicaid Other | Attending: Emergency Medicine | Admitting: Emergency Medicine

## 2018-07-25 ENCOUNTER — Encounter (HOSPITAL_COMMUNITY): Payer: Self-pay | Admitting: Emergency Medicine

## 2018-07-25 DIAGNOSIS — O9989 Other specified diseases and conditions complicating pregnancy, childbirth and the puerperium: Secondary | ICD-10-CM | POA: Insufficient documentation

## 2018-07-25 DIAGNOSIS — R109 Unspecified abdominal pain: Secondary | ICD-10-CM

## 2018-07-25 DIAGNOSIS — R1030 Lower abdominal pain, unspecified: Secondary | ICD-10-CM | POA: Diagnosis not present

## 2018-07-25 DIAGNOSIS — Z3A01 Less than 8 weeks gestation of pregnancy: Secondary | ICD-10-CM | POA: Insufficient documentation

## 2018-07-25 DIAGNOSIS — O26891 Other specified pregnancy related conditions, first trimester: Secondary | ICD-10-CM

## 2018-07-25 LAB — COMPREHENSIVE METABOLIC PANEL
ALK PHOS: 89 U/L (ref 38–126)
ALT: 17 U/L (ref 0–44)
AST: 22 U/L (ref 15–41)
Albumin: 4.1 g/dL (ref 3.5–5.0)
Anion gap: 8 (ref 5–15)
BILIRUBIN TOTAL: 0.3 mg/dL (ref 0.3–1.2)
BUN: 9 mg/dL (ref 6–20)
CALCIUM: 8.9 mg/dL (ref 8.9–10.3)
CO2: 22 mmol/L (ref 22–32)
CREATININE: 0.64 mg/dL (ref 0.44–1.00)
Chloride: 106 mmol/L (ref 98–111)
GFR calc Af Amer: >60 mL/min (ref >60)
Glucose, Bld: 83 mg/dL (ref 70–99)
POTASSIUM: 3.2 mmol/L — AB (ref 3.5–5.1)
Sodium: 136 mmol/L (ref 135–145)
TOTAL PROTEIN: 7.6 g/dL (ref 6.5–8.1)

## 2018-07-25 LAB — CBC
HCT: 37.7 % (ref 36.0–46.0)
Hemoglobin: 11.6 g/dL — ABNORMAL LOW (ref 12.0–15.0)
MCH: 23.1 pg — ABNORMAL LOW (ref 26.0–34.0)
MCHC: 30.8 g/dL (ref 30.0–36.0)
MCV: 75 fL — ABNORMAL LOW (ref 80.0–100.0)
PLATELETS: 418 10*3/uL — AB (ref 150–400)
RBC: 5.03 MIL/uL (ref 3.87–5.11)
RDW: 16.5 % — AB (ref 11.5–15.5)
WBC: 5.3 10*3/uL (ref 4.0–10.5)
nRBC: 0 % (ref 0.0–0.2)

## 2018-07-25 LAB — URINALYSIS, ROUTINE W REFLEX MICROSCOPIC
BILIRUBIN URINE: NEGATIVE
Glucose, UA: NEGATIVE mg/dL
HGB URINE DIPSTICK: NEGATIVE
KETONES UR: NEGATIVE mg/dL
Leukocytes,Ua: NEGATIVE
NITRITE: NEGATIVE
Protein, ur: NEGATIVE mg/dL
Specific Gravity, Urine: 1.02 (ref 1.005–1.030)
pH: 5 (ref 5.0–8.0)

## 2018-07-25 LAB — I-STAT BETA HCG BLOOD, ED (MC, WL, AP ONLY)

## 2018-07-25 LAB — LIPASE, BLOOD: Lipase: 49 U/L (ref 11–51)

## 2018-07-25 MED ORDER — SODIUM CHLORIDE 0.9% FLUSH: 3.0000 mL | Freq: Once | INTRAVENOUS | Status: DC

## 2018-07-25 MED ORDER — SODIUM CHLORIDE 0.9 % IV SOLN
Freq: Once | INTRAVENOUS | Status: AC
  Administered 2018-07-25: via INTRAVENOUS

## 2018-07-25 NOTE — ED Triage Notes (Signed)
Lower abd pain x 2 days and is 2 days late on her period.   Had a positive preg test.  Worried she may be having a miscarriage. Denies vaginal bleeding.

## 2018-07-25 NOTE — ED Notes (Signed)
Dr Dolly Rias in to do a bedside US to see if he could see anything

## 2018-07-25 NOTE — ED Provider Notes (Signed)
[facility] EMERGENCY DEPARTMENT Provider Note   CSN: [id number] Arrival date & time: 07/25/18  2106     History   Chief Complaint Chief Complaint  Patient presents with  . Abdominal Cramping    HPI Michele Alexander is a 23 y.o. female.  Patient is a 23 year old female who presents to the emergency department with lower abdomen pain.  The patient states that over the last 2 days she has been having a cramping sensation in her lower abdomen.  She says it is almost constant.  The patient states she is 3 days late for her menstrual cycle, and she is concerned because she has had at least 7 miscarriages in the past.  She has had 1 term delivery.  She took a pregnancy test at home on last evening and it was positive.  When the cramping type pain continued, the patient came to the emergency department for evaluation.  There is been no dysuria, no hematuria, no fever, and no recent operations or procedures.  No recent infection.  No recent travel, and no travel to other countries recently.  No vaginal bleeding reported.  The history is provided by the patient.    Past Medical History:  Diagnosis Date  . Anemia   . Anxiety   . Cancer ([place])    Hodgkin's Lymphoma  . Depression   . Hodgkin's disease(201)   . Sickle cell trait ([place])    traits    Patient Active Problem List   Diagnosis Date Noted  . Pregnancy, supervision, high-risk, first trimester 04/23/2015  . History of Hodgkin's lymphoma 04/23/2015  . Sickle cell trait ([place]) 04/23/2015  . Nausea & vomiting 01/14/2015    Past Surgical History:  Procedure Laterality Date  . DILATION AND EVACUATION N/A 12/21/2015   Procedure: DILATATION AND EVACUATION;  Surgeon: Will Bonnet, MD;  Location: [facility] ORS;  Service: Gynecology;  Laterality: N/A;  . NECK LESION BIOPSY    . OTHER SURGICAL HISTORY N/A 2015   Endoscopy  . PORT-A-CATH REMOVAL    . PORTACATH PLACEMENT Left 2010     OB History    Gravida  6   Para  1   Term    1   Preterm  0   AB  4   Living  1     SAB  4   TAB  0   Ectopic  0   Multiple  0   Live Births  1            Home Medications    Prior to Admission medications   Medication Sig Start Date End Date Taking? Authorizing Provider  amoxicillin-clavulanate (AUGMENTIN) 875-125 MG tablet Take 1 tablet by mouth 2 (two) times daily. One po bid x 7 days 03/14/18   Sherwood Gambler, MD  naproxen (NAPROSYN) 375 MG tablet Take 1 tablet (375 mg total) by mouth 2 (two) times daily. 03/28/18   Harris, Abigail, PA-C  ondansetron (ZOFRAN) 4 MG tablet Take 1 tablet (4 mg total) by mouth every 8 (eight) hours as needed for nausea or vomiting. 03/28/18   Margarita Mail, PA-C  Prenatal Vit-Fe Fumarate-FA (PRENATAL VITAMIN PO) Take by mouth.    [provider]    Family History Family History  Problem Relation Age of Onset  . Hypertension Mother   . Diabetes Maternal Grandfather   . Hypertension Maternal Grandfather   . Cancer Maternal Grandfather   . Cancer Maternal Grandmother     Social History Social History  Tobacco Use  . Smoking status: Never Smoker  . Smokeless tobacco: Never Used  Substance Use Topics  . Alcohol use: No  . Drug use: No     Allergies   Other; Diatrizoate; Iodinated diagnostic agents; Nexium [esomeprazole magnesium]; and Cheese   Review of Systems Review of Systems  Constitutional: Negative for activity change.       All ROS Neg except as noted in HPI  HENT: Negative for nosebleeds.   Eyes: Negative for photophobia and discharge.  Respiratory: Negative for cough, shortness of breath and wheezing.   Cardiovascular: Negative for chest pain and palpitations.  Gastrointestinal: Positive for abdominal pain, constipation and vomiting. Negative for blood in stool.  Genitourinary: Negative for dysuria, frequency and hematuria.  Musculoskeletal: Negative for arthralgias, back pain and neck pain.  Skin: Negative.   Neurological: Negative for  dizziness, seizures and speech difficulty.  Psychiatric/Behavioral: Negative for confusion and hallucinations.     Physical Exam Updated Vital Signs BP 121/77 (BP Location: Right Arm)   Pulse (!) 119   Temp 98.4 F (36.9 C) (Oral)   Resp 18   Ht 4\' 11"  (1.499 m)   Wt 38.6 kg   LMP 06/24/2018 (Exact Date)   SpO2 100%   Breastfeeding Unknown   BMI 17.17 kg/m   Physical Exam Vitals signs and nursing note reviewed.  Constitutional:      Appearance: She is well-developed. She is not toxic-appearing.  HENT:     Head: Normocephalic.     Right Ear: Tympanic membrane and external ear normal.     Left Ear: Tympanic membrane and external ear normal.  Eyes:     General: Lids are normal.     Pupils: Pupils are equal, round, and reactive to light.  Neck:     Musculoskeletal: Normal range of motion and neck supple.     Vascular: No carotid bruit.  Cardiovascular:     Rate and Rhythm: Normal rate and regular rhythm.     Pulses: Normal pulses.     Heart sounds: Normal heart sounds.  Pulmonary:     Effort: No respiratory distress.     Breath sounds: Normal breath sounds.  Abdominal:     General: Bowel sounds are normal.     Palpations: Abdomen is soft.     Tenderness: There is no abdominal tenderness. There is no guarding.     Comments: No significant right or left lower quadrant pain to palpation.  No rebound.  No CVA tenderness at this time.  No pain with flexing of the psoas.  Musculoskeletal: Normal range of motion.     Comments: Capillary refill is less than 2 seconds.  Lymphadenopathy:     Head:     Right side of head: No submandibular adenopathy.     Left side of head: No submandibular adenopathy.     Cervical: No cervical adenopathy.  Skin:    General: Skin is warm and dry.  Neurological:     Mental Status: She is alert and oriented to person, place, and time.     Cranial Nerves: No cranial nerve deficit.     Sensory: No sensory deficit.  Psychiatric:        Speech:  Speech normal.      ED Treatments / Results  Labs (all labs ordered are listed, but only abnormal results are displayed) Labs Reviewed  COMPREHENSIVE METABOLIC PANEL - Abnormal; Notable for the following components:      Result Value   Potassium 3.2 (*)  All other components within normal limits  CBC - Abnormal; Notable for the following components:   Hemoglobin 11.6 (*)    MCV 75.0 (*)    MCH 23.1 (*)    RDW 16.5 (*)    Platelets 418 (*)    All other components within normal limits  I-STAT BETA HCG BLOOD, ED ([facility], [facility], [facility] ONLY) - Abnormal; Notable for the following components:   I-stat hCG, quantitative >2,000.0 (*)    All other components within normal limits  LIPASE, BLOOD  URINALYSIS, ROUTINE W REFLEX MICROSCOPIC    EKG None  Radiology No results found.  Procedures Procedures (including critical care time)  Medications Ordered in ED Medications  sodium chloride flush (NS) 0.9 % injection 3 mL (has no administration in time range)     Initial Impression / Assessment and Plan / ED Course  I have reviewed the triage vital signs and the nursing notes.  Pertinent labs & imaging results that were available during my care of the patient were reviewed by me and considered in my medical decision making (see chart for details).       Final Clinical Impressions(s) / ED Diagnoses MDM  Pulse rate elevated 119, otherwise vital signs within normal limits.  Pulse oximetry is 100% on room air.  Within normal limits by my interpretation.  The patient is awake and alert and in no distress at this time.  She says compared to the cramping pain of earlier this morning the cramping now is mild.  Urine analysis is within normal limits.  Lipase is normal at 49.  The comprehensive metabolic panel shows the potassium to be slightly low at 3.2, The anion gap is normal at 8.  The complete blood count is normal.  Pt seen with me by Dr Dayna Barker. Patient will receive IV fluids and  will obtain a quantitative beta hCG from the lab.  A bedside ultrasound was performed by Dr. Dayna Barker.  Unable to visualize landmarks at this time.  Will obtain ultrasound tomorrow morning in the radiology suite. Beta HCG 5,394.  Patient tolerated IV fluids without problem.  Patient is ambulatory at discharge without problem.  Pt in agreement with this plan.    Final diagnoses:  Abdominal pain during pregnancy in first trimester    ED Discharge Orders         Ordered    US OB LESS THAN [place] OB TRANSVAGINAL     07/26/18 0048           Lily Kocher, PA-C 07/26/18 0110    Mesner, Corene Cornea, MD 07/28/18 2322

## 2018-07-26 ENCOUNTER — Ambulatory Visit (HOSPITAL_COMMUNITY)
Admission: RE | Admit: 2018-07-26 | Discharge: 2018-07-26 | Disposition: A | Discharge status: Home / Self Care | Payer: Medicaid Other | Source: Ambulatory Visit | Attending: Physician Assistant | Admitting: Physician Assistant

## 2018-07-26 DIAGNOSIS — Z8759 Personal history of other complications of pregnancy, childbirth and the puerperium: Secondary | ICD-10-CM | POA: Insufficient documentation

## 2018-07-26 DIAGNOSIS — R109 Unspecified abdominal pain: Secondary | ICD-10-CM | POA: Insufficient documentation

## 2018-07-26 DIAGNOSIS — Z3A01 Less than 8 weeks gestation of pregnancy: Secondary | ICD-10-CM | POA: Insufficient documentation

## 2018-07-26 DIAGNOSIS — O9989 Other specified diseases and conditions complicating pregnancy, childbirth and the puerperium: Secondary | ICD-10-CM | POA: Diagnosis not present

## 2018-07-26 LAB — HCG, QUANTITATIVE, PREGNANCY: hCG, Beta Chain, Quant, S: 5394 m[IU]/mL — ABNORMAL HIGH (ref <5)

## 2018-07-26 NOTE — Discharge Instructions (Addendum)
Your vital signs at discharge are within normal limits.  Your oxygen level is 100% on room air.  Within normal limits by my interpretation.  Your beta hCG is 5394 consistent with about 4weeks pregnancy.  Someone from the radiology department will call you with an appointment time for an ultrasound.  Please call Dr. Glo Herring for GYN evaluation as soon as possible.  Please let his office know that you were seen in the emergency department because of abdominal cramping and that you have a positive pregnancy test.  Return to the emergency department if any emergent changes in your condition, problems, or concerns.

## 2018-07-26 NOTE — ED Provider Notes (Signed)
Pt returned for [place]. Below results reviewed with pt. Pt given further instructions, including, but not limited to: needing a repeat of her quantitative HCG blood test in 48 hours, pelvic rest, call her regular OB/GYN doctor today to schedule a follow up appointment within the next day to recheck her quantitative HCG blood test.  Return to the Emergency Department immediately if worsening. Pt verb understanding.    US Ob Less Than 14 Weeks With Ob Transvaginal Result Date: 07/26/2018 CLINICAL DATA:  Pelvic cramping EXAM: OBSTETRIC <14 WK [place] AND TRANSVAGINAL OB US TECHNIQUE: Both transabdominal and transvaginal ultrasound examinations were performed for complete evaluation of the gestation as well as the maternal uterus, adnexal regions, and pelvic cul-de-sac. Transvaginal technique was performed to assess early pregnancy. COMPARISON:  None. FINDINGS: Intrauterine gestational sac: Visualized Yolk sac:  Not visualized Embryo:  Not visualized Cardiac Activity: Not visualized MSD: 6 mm   5 w   2 d Subchorionic hemorrhage:  None visualized. Maternal uterus/adnexae: Cervical os appears closed. Right ovary measures 3.0 x 1.6 x 2.0 cm. Left ovary measures 3.4 x 2.9 x 2.9 cm. There is an apparent corpus luteum on the left measuring 2.4 x 1.7 x 2.2 cm. Trace fluid adjacent to the left ovary. IMPRESSION: Probable early intrauterine gestational sac, but no yolk sac, fetal pole, or cardiac activity yet visualized. Recommend follow-up quantitative B-HCG levels and follow-up US in 14 days to assess viability. This recommendation follows SRU consensus guidelines: Diagnostic Criteria for Nonviable Pregnancy Early in the First Trimester. [place] Med 2013; 035:0093-81. based on apparent gestational sac size, estimated gestational age is 5+ weeks. Corpus luteum on the left. Minimal free fluid adjacent to left ovary. Cervical os appears closed. Electronically Signed   By: Lowella Grip III M.D.   On: 07/26/2018 16:35       Francine Graven, DO 07/26/18 1658

## 2018-07-27 ENCOUNTER — Telehealth: Payer: Self-pay | Admitting: *Deleted

## 2018-07-27 NOTE — Telephone Encounter (Signed)
Patient states she went to ED and was found to be pregnant but U/S did not show HR.  Was told she needed to f/u with [place] for labs.  Advised patient to come in the am for HCG. Pt verbalized understanding.

## 2018-07-28 ENCOUNTER — Other Ambulatory Visit: Payer: Medicaid Other

## 2018-07-28 DIAGNOSIS — O200 Threatened abortion: Secondary | ICD-10-CM

## 2018-07-29 LAB — BETA HCG QUANT (REF LAB): hCG Quant: 10860 m[IU]/mL

## 2018-08-10 ENCOUNTER — Telehealth: Payer: Self-pay | Admitting: *Deleted

## 2018-08-10 ENCOUNTER — Encounter: Payer: Medicaid Other | Admitting: Women's Health

## 2018-08-10 ENCOUNTER — Ambulatory Visit: Payer: Medicaid Other | Admitting: *Deleted

## 2018-08-10 NOTE — Telephone Encounter (Signed)
Patient showed up to office stating that she thought she had a new ob appt today. After reviewing chart I saw that Michele Alexander had sent patient a message to schedule a followup ultrasound. Patient states that she didn't receive the message. Advised patient to schedule [place] and then we would let her know what to do next. Michele Alexander scheduled patient for [place] and patient aware of appt. Patient had no problems or concerns today.

## 2018-08-13 ENCOUNTER — Other Ambulatory Visit: Payer: Self-pay | Admitting: Obstetrics & Gynecology

## 2018-08-13 DIAGNOSIS — O3680X0 Pregnancy with inconclusive fetal viability, not applicable or unspecified: Secondary | ICD-10-CM

## 2018-08-17 ENCOUNTER — Ambulatory Visit (INDEPENDENT_AMBULATORY_CARE_PROVIDER_SITE_OTHER): Payer: Medicaid Other

## 2018-08-17 DIAGNOSIS — O3680X0 Pregnancy with inconclusive fetal viability, not applicable or unspecified: Secondary | ICD-10-CM | POA: Diagnosis not present

## 2018-08-17 NOTE — Progress Notes (Signed)
[place] 7+5 wks,single IUP w/ys,positive FHT 171 bpm,small subchorionic hemorrhage 1.7 x 1 x .5 cm,normal ovaries bilat,crl 16 mm

## 2018-08-30 ENCOUNTER — Telehealth: Payer: Self-pay | Admitting: *Deleted

## 2018-08-30 NOTE — Telephone Encounter (Signed)
Called patient and informed her of COVID 19 restrictions. Patient reports no symptoms at this time or contact with persons infected or suspected of infection.

## 2018-08-31 ENCOUNTER — Other Ambulatory Visit (HOSPITAL_COMMUNITY)
Admission: RE | Admit: 2018-08-31 | Discharge: 2018-08-31 | Disposition: A | Discharge status: Home / Self Care | Payer: Medicaid Other | Source: Ambulatory Visit | Attending: Obstetrics & Gynecology | Admitting: Obstetrics & Gynecology

## 2018-08-31 ENCOUNTER — Ambulatory Visit (INDEPENDENT_AMBULATORY_CARE_PROVIDER_SITE_OTHER): Payer: Medicaid Other | Admitting: Women's Health

## 2018-08-31 ENCOUNTER — Encounter: Payer: Self-pay | Admitting: Women's Health

## 2018-08-31 ENCOUNTER — Other Ambulatory Visit: Payer: Self-pay

## 2018-08-31 ENCOUNTER — Ambulatory Visit: Payer: Medicaid Other | Admitting: *Deleted

## 2018-08-31 VITALS — BP 110/74 | HR 109 | Wt 86.5 lb

## 2018-08-31 DIAGNOSIS — Z331 Pregnant state, incidental: Secondary | ICD-10-CM

## 2018-08-31 DIAGNOSIS — Z3A09 9 weeks gestation of pregnancy: Secondary | ICD-10-CM | POA: Diagnosis not present

## 2018-08-31 DIAGNOSIS — Z3682 Encounter for antenatal screening for nuchal translucency: Secondary | ICD-10-CM

## 2018-08-31 DIAGNOSIS — O2621 Pregnancy care for patient with recurrent pregnancy loss, first trimester: Secondary | ICD-10-CM

## 2018-08-31 DIAGNOSIS — Z3481 Encounter for supervision of other normal pregnancy, first trimester: Secondary | ICD-10-CM

## 2018-08-31 DIAGNOSIS — Z3490 Encounter for supervision of normal pregnancy, unspecified, unspecified trimester: Secondary | ICD-10-CM | POA: Insufficient documentation

## 2018-08-31 DIAGNOSIS — N96 Recurrent pregnancy loss: Secondary | ICD-10-CM

## 2018-08-31 DIAGNOSIS — Z1389 Encounter for screening for other disorder: Secondary | ICD-10-CM

## 2018-08-31 DIAGNOSIS — Z1371 Encounter for nonprocreative screening for genetic disease carrier status: Secondary | ICD-10-CM

## 2018-08-31 LAB — POCT URINALYSIS DIPSTICK OB
Blood, UA: NEGATIVE
Glucose, UA: NEGATIVE
Ketones, UA: NEGATIVE
Leukocytes, UA: NEGATIVE
NITRITE UA: NEGATIVE

## 2018-08-31 MED ORDER — DOXYLAMINE-PYRIDOXINE 10-10 MG PO TBEC: DELAYED_RELEASE_TABLET | ORAL | 6 refills | Status: DC

## 2018-08-31 NOTE — Patient Instructions (Signed)
Michele Alexander, I greatly value your feedback.  If you receive a survey following your visit with [place] today, we appreciate you taking the time to fill it out.  Thanks, Knute Neu, CNM, [facility]  [place]!!! It is now [place] [facility] ([place],  [id number]) Entrance located off of [address] parking    Nausea & Vomiting  Have saltine crackers or pretzels by your bed and eat a few bites before you raise your head out of bed in the morning  Eat small frequent meals throughout the day instead of large meals  Drink plenty of fluids throughout the day to stay hydrated, just don't drink a lot of fluids with your meals.  This can make your stomach fill up faster making you feel sick  Do not brush your teeth right after you eat  Products with real ginger are good for nausea, like ginger ale and ginger hard candy Make sure it says made with real ginger!  Sucking on sour candy like lemon heads is also good for nausea  If your prenatal vitamins make you nauseated, take them at night so you will sleep through the nausea  Sea Bands  If you feel like you need medicine for the nausea & vomiting please let us know  If you are unable to keep any fluids or food down please let us know   Constipation  Drink plenty of fluid, preferably water, throughout the day  Eat foods high in fiber such as fruits, vegetables, and grains  Exercise, such as walking, is a good way to keep your bowels regular  Drink warm fluids, especially warm prune juice, or decaf coffee  Eat a 1/2 cup of real oatmeal (not instant), 1/2 cup applesauce, and 1/2-1 cup warm prune juice every day  If needed, you may take Colace (docusate sodium) stool softener once or twice a day to help keep the stool soft. If you are pregnant, wait until you are out of your first trimester (12-14 weeks of pregnancy)  If you still are having problems with  constipation, you may take Miralax once daily as needed to help keep your bowels regular.  If you are pregnant, wait until you are out of your first trimester (12-14 weeks of pregnancy)  For Headaches:   Stay well hydrated, drink enough water so that your urine is clear, sometimes if you are dehydrated you can get headaches  Eat small frequent meals and snacks, sometimes if you are hungry you can get headaches  Sometimes you get headaches during pregnancy from the pregnancy hormones  You can try tylenol (1-2 regular strength 325mg  or 1-2 extra strength 500mg ) as directed on the box. The least amount of medication that works is best.   Cool compresses (cool wet washcloth or ice pack) to area of head that is hurting  You can also try drinking a caffeinated drink to see if this will help  If not helping, try below:  For Prevention of Headaches/Migraines:  CoQ10 100mg  three times daily  Vitamin B2 400mg  daily  Magnesium Oxide 400-600mg  daily  If You Get a Bad Headache/Migraine:  Benadryl 25mg    Magnesium Oxide  1 large Gatorade  2 extra strength Tylenol (1,000mg  total)  1 cup coffee or Coke  If this doesn't help please call us @ [phone]     First Trimester of Pregnancy The first trimester of pregnancy is from week 1 until the end  of week 12 (months 1 through 3). A week after a sperm fertilizes an egg, the egg will implant on the wall of the uterus. This embryo will begin to develop into a baby. Genes from you and your partner are forming the baby. The female genes determine whether the baby is a boy or a girl. At 6-8 weeks, the eyes and face are formed, and the heartbeat can be seen on ultrasound. At the end of 12 weeks, all the baby's organs are formed.  Now that you are pregnant, you will want to do everything you can to have a healthy baby. Two of the most important things are to get good prenatal care and to follow your health care provider's instructions. Prenatal care  is all the medical care you receive before the baby's birth. This care will help prevent, find, and treat any problems during the pregnancy and childbirth. BODY CHANGES Your body goes through many changes during pregnancy. The changes vary from woman to woman.   You may gain or lose a couple of pounds at first.  You may feel sick to your stomach (nauseous) and throw up (vomit). If the vomiting is uncontrollable, call your health care provider.  You may tire easily.  You may develop headaches that can be relieved by medicines approved by your health care provider.  You may urinate more often. Painful urination may mean you have a bladder infection.  You may develop heartburn as a result of your pregnancy.  You may develop constipation because certain hormones are causing the muscles that push waste through your intestines to slow down.  You may develop hemorrhoids or swollen, bulging veins (varicose veins).  Your breasts may begin to grow larger and become tender. Your nipples may stick out more, and the tissue that surrounds them (areola) may become darker.  Your gums may bleed and may be sensitive to brushing and flossing.  Dark spots or blotches (chloasma, mask of pregnancy) may develop on your face. This will likely fade after the baby is born.  Your menstrual periods will stop.  You may have a loss of appetite.  You may develop cravings for certain kinds of food.  You may have changes in your emotions from day to day, such as being excited to be pregnant or being concerned that something may go wrong with the pregnancy and baby.  You may have more vivid and strange dreams.  You may have changes in your hair. These can include thickening of your hair, rapid growth, and changes in texture. Some women also have hair loss during or after pregnancy, or hair that feels dry or thin. Your hair will most likely return to normal after your baby is born. WHAT TO EXPECT AT YOUR PRENATAL  VISITS During a routine prenatal visit:  You will be weighed to make sure you and the baby are growing normally.  Your blood pressure will be taken.  Your abdomen will be measured to track your baby's growth.  The fetal heartbeat will be listened to starting around week 10 or 12 of your pregnancy.  Test results from any previous visits will be discussed. Your health care provider may ask you:  How you are feeling.  If you are feeling the baby move.  If you have had any abnormal symptoms, such as leaking fluid, bleeding, severe headaches, or abdominal cramping.  If you have any questions. Other tests that may be performed during your first trimester include:  Blood tests to find your  blood type and to check for the presence of any previous infections. They will also be used to check for low iron levels (anemia) and Rh antibodies. Later in the pregnancy, blood tests for diabetes will be done along with other tests if problems develop.  Urine tests to check for infections, diabetes, or protein in the urine.  An ultrasound to confirm the proper growth and development of the baby.  An amniocentesis to check for possible genetic problems.  Fetal screens for spina bifida and Down syndrome.  You may need other tests to make sure you and the baby are doing well. HOME CARE INSTRUCTIONS  Medicines  Follow your health care provider's instructions regarding medicine use. Specific medicines may be either safe or unsafe to take during pregnancy.  Take your prenatal vitamins as directed.  If you develop constipation, try taking a stool softener if your health care provider approves. Diet  Eat regular, well-balanced meals. Choose a variety of foods, such as meat or vegetable-based protein, fish, milk and low-fat dairy products, vegetables, fruits, and whole grain breads and cereals. Your health care provider will help you determine the amount of weight gain that is right for you.  Avoid  raw meat and uncooked cheese. These carry germs that can cause birth defects in the baby.  Eating four or five small meals rather than three large meals a day may help relieve nausea and vomiting. If you start to feel nauseous, eating a few soda crackers can be helpful. Drinking liquids between meals instead of during meals also seems to help nausea and vomiting.  If you develop constipation, eat more high-fiber foods, such as fresh vegetables or fruit and whole grains. Drink enough fluids to keep your urine clear or pale yellow. Activity and Exercise  Exercise only as directed by your health care provider. Exercising will help you:  Control your weight.  Stay in shape.  Be prepared for labor and delivery.  Experiencing pain or cramping in the lower abdomen or low back is a good sign that you should stop exercising. Check with your health care provider before continuing normal exercises.  Try to avoid standing for long periods of time. Move your legs often if you must stand in one place for a long time.  Avoid heavy lifting.  Wear low-heeled shoes, and practice good posture.  You may continue to have sex unless your health care provider directs you otherwise. Relief of Pain or Discomfort  Wear a good support bra for breast tenderness.    Take warm sitz baths to soothe any pain or discomfort caused by hemorrhoids. Use hemorrhoid cream if your health care provider approves.    Rest with your legs elevated if you have leg cramps or low back pain.  If you develop varicose veins in your legs, wear support hose. Elevate your feet for 15 minutes, 3-4 times a day. Limit salt in your diet. Prenatal Care  Schedule your prenatal visits by the twelfth week of pregnancy. They are usually scheduled monthly at first, then more often in the last 2 months before delivery.  Write down your questions. Take them to your prenatal visits.  Keep all your prenatal visits as directed by your health  care provider. Safety  Wear your seat belt at all times when driving.  Make a list of emergency phone numbers, including numbers for family, friends, the hospital, and police and fire departments. General Tips  Ask your health care provider for a referral to a local prenatal  education class. Begin classes no later than at the beginning of month 6 of your pregnancy.  Ask for help if you have counseling or nutritional needs during pregnancy. Your health care provider can offer advice or refer you to specialists for help with various needs.  Do not use hot tubs, steam rooms, or saunas.  Do not douche or use tampons or scented sanitary pads.  Do not cross your legs for long periods of time.  Avoid cat litter boxes and soil used by cats. These carry germs that can cause birth defects in the baby and possibly loss of the fetus by miscarriage or stillbirth.  Avoid all smoking, herbs, alcohol, and medicines not prescribed by your health care provider. Chemicals in these affect the formation and growth of the baby.  Schedule a dentist appointment. At home, brush your teeth with a soft toothbrush and be gentle when you floss. SEEK MEDICAL CARE IF:   You have dizziness.  You have mild pelvic cramps, pelvic pressure, or nagging pain in the abdominal area.  You have persistent nausea, vomiting, or diarrhea.  You have a bad smelling vaginal discharge.  You have pain with urination.  You notice increased swelling in your face, hands, legs, or ankles. SEEK IMMEDIATE MEDICAL CARE IF:   You have a fever.  You are leaking fluid from your vagina.  You have spotting or bleeding from your vagina.  You have severe abdominal cramping or pain.  You have rapid weight gain or loss.  You vomit blood or material that looks like coffee grounds.  You are exposed to [place] measles and have never had them.  You are exposed to fifth disease or chickenpox.  You develop a severe headache.  You  have shortness of breath.  You have any kind of trauma, such as from a fall or a car accident. Document Released: 05/20/2001 Document Revised: 10/10/2013 Document Reviewed: 04/05/2013 [facility] Patient Information 2015 [place], [place]. This information is not intended to replace advice given to you by your health care provider. Make sure you discuss any questions you have with your health care provider.  Coronavirus (COVID-19) Are you at risk?  Are you at risk for the Coronavirus (COVID-19)?  To be considered HIGH RISK for Coronavirus (COVID-19), you have to meet the following criteria:  . Traveled to [place], [place], [place], [place] or [place]; or in the [place] to [place], [place], [place], or [place]; and have fever, cough, and shortness of breath within the last 2 weeks of travel OR . Been in close contact with a person diagnosed with COVID-19 within the last 2 weeks and have fever, cough, and shortness of breath . IF YOU DO NOT MEET THESE CRITERIA, YOU ARE CONSIDERED LOW RISK FOR COVID-19.  What to do if you are HIGH RISK for COVID-19?  Marland Kitchen If you are having a medical emergency, call 911. . Seek medical care right away. Before you go to a doctor's office, urgent care or emergency department, call ahead and tell them about your recent travel, contact with someone diagnosed with COVID-19, and your symptoms. You should receive instructions from your physician's office regarding next steps of care.  . When you arrive at healthcare provider, tell the healthcare staff immediately you have returned from visiting [place], [place], [place], [place] or [place]; or traveled in the [place] to [place], [place], [place], or [place]; in the last two weeks or you have been in close contact with a person  diagnosed with COVID-19 in the last 2 weeks.   . Tell the health care staff about your symptoms: fever, cough and shortness of breath. . After you have been seen by a medical  provider, you will be either: o Tested for (COVID-19) and discharged home on quarantine except to seek medical care if symptoms worsen, and asked to  - Stay home and avoid contact with others until you get your results (4-5 days)  - Avoid travel on public transportation if possible (such as bus, train, or airplane) or o Sent to the Emergency Department by EMS for evaluation, COVID-19 testing, and possible admission depending on your condition and test results.  What to do if you are LOW RISK for COVID-19?  Reduce your risk of any infection by using the same precautions used for avoiding the common cold or flu:  Marland Kitchen Wash your hands often with soap and warm water for at least 20 seconds.  If soap and water are not readily available, use an alcohol-based hand sanitizer with at least 60% alcohol.  . If coughing or sneezing, cover your mouth and nose by coughing or sneezing into the elbow areas of your shirt or coat, into a tissue or into your sleeve (not your hands). . Avoid shaking hands with others and consider head nods or verbal greetings only. . Avoid touching your eyes, nose, or mouth with unwashed hands.  . Avoid close contact with people who are sick. . Avoid places or events with large numbers of people in one location, like concerts or sporting events. . Carefully consider travel plans you have or are making. . If you are planning any travel outside or inside the [place], visit the CDC's Travelers' Health webpage for the latest health notices. . If you have some symptoms but not all symptoms, continue to monitor at home and seek medical attention if your symptoms worsen. . If you are having a medical emergency, call 911.   [place] / e-Visit: [email or web address]         [facility]: [facility]: [phone]                   [facility]:  [phone]

## 2018-08-31 NOTE — Progress Notes (Signed)
INITIAL OBSTETRICAL VISIT Patient name: Michele Alexander MRN [id number]  Date of birth: May 21, 1996 Chief Complaint:   Initial Prenatal Visit (headaches, nausea)  History of Present Illness:   Michele Alexander is a 23 y.o. [phone] Caucasian female at [redacted]w[redacted]d by LMP c/w 8wk u/s, with an Estimated Date of Delivery: 03/31/19 being seen today for her initial obstetrical visit.   Her obstetrical history is significant for term uncomplicated SVB x 1, SAB x 5.   H/O Hodgkin's lymphoma 2010, in remission SickleCell trait Today she reports nausea, headaches.  Patient's last menstrual period was 06/24/2018 (exact date). Last pap never. Results were: n/a Review of Systems:   Pertinent items are noted in HPI Denies cramping/contractions, leakage of fluid, vaginal bleeding, abnormal vaginal discharge w/ itching/odor/irritation, headaches, visual changes, shortness of breath, chest pain, abdominal pain, severe nausea/vomiting, or problems with urination or bowel movements unless otherwise stated above.  Pertinent History Reviewed:  Reviewed past medical,surgical, social, obstetrical and family history.  Reviewed problem list, medications and allergies. OB History  Gravida Para Term Preterm AB Living  7 1 1  0 5 1  SAB TAB Ectopic Multiple Live Births  5 0 0 0 1    # Outcome Date GA Lbr Len/2nd Weight Sex Delivery Anes PTL Lv  7 Current           6 SAB 02/2018          5 SAB 12/11/17          4 SAB 10/26/17          3 SAB 10/14/16 [redacted]w[redacted]d         2 SAB 12/21/15          1 Term 04/23/15 [redacted]w[redacted]d 35:30 / 00:37 7 lb 7.2 oz (3.38 kg) F Vag-Spont EPI Y LIV   Physical Assessment:   Vitals:   08/31/18 1514  BP: 110/74  Pulse: (!) 109  Weight: 86 lb 8 oz (39.2 kg)  Body mass index is 17.47 kg/m.       Physical Examination:  General appearance - well appearing, and in no distress  Mental status - alert, oriented to person, place, and time  Psych:  She has a normal mood and affect  Skin - warm and dry,  normal color, no suspicious lesions noted  Chest - effort normal, all lung fields clear to auscultation bilaterally  Heart - normal rate and regular rhythm  Abdomen - soft, nontender  Extremities:  No swelling or varicosities noted  Pelvic - VULVA: normal appearing vulva with no masses, tenderness or lesions  VAGINA: normal appearing vagina with normal color and discharge, no lesions  CERVIX: normal appearing cervix without discharge or lesions, no CMT  Thin prep pap is done w/ reflex HR HPV cotesting  Fetal Heart Rate (bpm): +u/s via informal transabdominal u/s, +active fetus  Results for orders placed or performed in visit on 08/31/18 (from the past 24 hour(s))  POC Urinalysis Dipstick OB   Collection Time: 08/31/18  3:22 PM  Result Value Ref Range   Color, UA     Clarity, UA     Glucose, UA Negative Negative   Bilirubin, UA     Ketones, UA neg    Spec Grav, UA     Blood, UA neg    pH, UA     POC,PROTEIN,UA Trace Negative, Trace, Small (1+), Moderate (2+), Large (3+), 4+   Urobilinogen, UA     Nitrite, UA neg    Leukocytes, UA Negative Negative  Appearance     Odor      Assessment & Plan:  1) Low-Risk Pregnancy [id number] at [redacted]w[redacted]d with an Estimated Date of Delivery: 03/31/19   2) Initial OB visit  3) H/o multiple miscarriages> check progesterone today  4) H/O Hodgkin's lymphoma> in remission  5) [place] trait  6) Nausea> rx diclegis  7) Headaches> gave printed prevention/relief measures   8) BabyScripts Optimized Pt> desires optimized schedule. Next appt at 13wks   Meds:  Meds ordered this encounter  Medications  . Doxylamine-Pyridoxine (DICLEGIS) 10-10 MG TBEC    Sig: 2 tabs q hs, if sx persist add 1 tab q am on day 3, if sx persist add 1 tab q afternoon on day 4    Dispense:  100 tablet    Refill:  6    Order Specific Question:   Supervising Provider    Answer:   Florian Buff [[id number]]    Initial labs obtained Continue prenatal vitamins Reviewed n/v relief  measures and warning s/s to report Reviewed recommended weight gain based on pre-gravid BMI Encouraged well-balanced diet Genetic Screening discussed: requested nt/it and maternit21 Cystic fibrosis, SMA, Fragile X screening discussed requested Ultrasound discussed; fetal survey: requested CCNC completed>PCM not here  Follow-up: Return in about 27 days (around 09/27/2018) for LROB, US:NT+1stIT.   Orders Placed This Encounter  Procedures  . Urine Culture  . US Fetal Nuchal Translucency Measurement  . Obstetric Panel, Including HIV  . Urinalysis, Routine w reflex microscopic  . MaterniT 21 plus Core, Blood  . Inheritest Core(CF97,SMA,FraX)  . Pain Management Screening Profile (10S)  . Progesterone  . POC Urinalysis Dipstick OB    Roma Schanz CNM, [facility] 08/31/2018 3:43 PM

## 2018-09-01 LAB — PMP SCREEN PROFILE (10S), URINE
Amphetamine Scrn, Ur: NEGATIVE ng/mL
BARBITURATE SCREEN URINE: NEGATIVE ng/mL
BENZODIAZEPINE SCREEN, URINE: NEGATIVE ng/mL
CANNABINOIDS UR QL SCN: NEGATIVE ng/mL
COCAINE(METAB.)SCREEN, URINE: NEGATIVE ng/mL
CREATININE(CRT), U: 254.6 mg/dL (ref 20.0–300.0)
Methadone Screen, Urine: NEGATIVE ng/mL
OPIATE SCREEN URINE: NEGATIVE ng/mL
OXYCODONE+OXYMORPHONE UR QL SCN: NEGATIVE ng/mL
Ph of Urine: 6 (ref 4.5–8.9)
Phencyclidine Qn, Ur: NEGATIVE ng/mL
Propoxyphene Scrn, Ur: NEGATIVE ng/mL

## 2018-09-02 LAB — CYTOLOGY - PAP
Chlamydia: NEGATIVE
Diagnosis: NEGATIVE
Neisseria Gonorrhea: NEGATIVE

## 2018-09-02 LAB — URINE CULTURE: Organism ID, Bacteria: NO GROWTH

## 2018-09-09 LAB — URINALYSIS, ROUTINE W REFLEX MICROSCOPIC
Bilirubin, UA: NEGATIVE
GLUCOSE, UA: NEGATIVE
KETONES UA: NEGATIVE
Leukocytes,UA: NEGATIVE
Nitrite, UA: NEGATIVE
PROTEIN UA: NEGATIVE
RBC, UA: NEGATIVE
SPEC GRAV UA: 1.026 (ref 1.005–1.030)
UUROB: 1 mg/dL (ref 0.2–1.0)
pH, UA: 5.5 (ref 5.0–7.5)

## 2018-09-09 LAB — MATERNIT 21 PLUS CORE, BLOOD
Fetal Fraction: 9
Result (T21): NEGATIVE
TRISOMY 13: NEGATIVE
TRISOMY 18: NEGATIVE
Trisomy 21 (Down syndrome): NEGATIVE

## 2018-09-09 LAB — OBSTETRIC PANEL, INCLUDING HIV
Antibody Screen: NEGATIVE
Basophils Absolute: 0 10*3/uL (ref 0.0–0.2)
Basos: 1 %
EOS (ABSOLUTE): 0.1 10*3/uL (ref 0.0–0.4)
Eos: 1 %
HIV Screen 4th Generation wRfx: NONREACTIVE
Hematocrit: 39.5 % (ref 34.0–46.6)
Hemoglobin: 12.9 g/dL (ref 11.1–15.9)
Hepatitis B Surface Ag: NEGATIVE
IMMATURE GRANULOCYTES: 0 %
Immature Grans (Abs): 0 10*3/uL (ref 0.0–0.1)
LYMPHS ABS: 1.2 10*3/uL (ref 0.7–3.1)
Lymphs: 18 %
MCH: 25.2 pg — AB (ref 26.6–33.0)
MCHC: 32.7 g/dL (ref 31.5–35.7)
MCV: 77 fL — AB (ref 79–97)
MONOS ABS: 0.4 10*3/uL (ref 0.1–0.9)
Monocytes: 6 %
NEUTROS ABS: 4.9 10*3/uL (ref 1.4–7.0)
Neutrophils: 74 %
PLATELETS: 407 10*3/uL (ref 150–450)
RBC: 5.12 x10E6/uL (ref 3.77–5.28)
RDW: 17.7 % — AB (ref 11.7–15.4)
RPR Ser Ql: NONREACTIVE
Rh Factor: POSITIVE
Rubella Antibodies, IGG: 1.01 index (ref >0.99)
WBC: 6.5 10*3/uL (ref 3.4–10.8)

## 2018-09-09 LAB — INHERITEST CORE(CF97,SMA,FRAX)

## 2018-09-09 LAB — PROGESTERONE: Progesterone: 33.4 ng/mL

## 2018-09-24 ENCOUNTER — Telehealth: Payer: Self-pay | Admitting: *Deleted

## 2018-09-24 NOTE — Telephone Encounter (Signed)

## 2018-09-27 ENCOUNTER — Other Ambulatory Visit: Payer: Self-pay

## 2018-09-27 ENCOUNTER — Ambulatory Visit (INDEPENDENT_AMBULATORY_CARE_PROVIDER_SITE_OTHER): Payer: Medicaid Other

## 2018-09-27 ENCOUNTER — Ambulatory Visit (INDEPENDENT_AMBULATORY_CARE_PROVIDER_SITE_OTHER): Payer: Medicaid Other | Admitting: Obstetrics and Gynecology

## 2018-09-27 VITALS — BP 110/71 | HR 109 | Wt 90.2 lb

## 2018-09-27 DIAGNOSIS — Z3481 Encounter for supervision of other normal pregnancy, first trimester: Secondary | ICD-10-CM

## 2018-09-27 DIAGNOSIS — Z3A13 13 weeks gestation of pregnancy: Secondary | ICD-10-CM

## 2018-09-27 DIAGNOSIS — Z3682 Encounter for antenatal screening for nuchal translucency: Secondary | ICD-10-CM

## 2018-09-27 NOTE — Progress Notes (Signed)
   LOW-RISK PREGNANCY VISIT Patient name: Michele Alexander MRN [id number]  Date of birth: 02-May-1996 Chief Complaint:   Routine Prenatal Visit  History of Present Illness:   Michele Alexander is a 23 y.o. [phone] female at [redacted]w[redacted]d with an Estimated Date of Delivery: 03/31/19 being seen today for ongoing management of a low-risk pregnancy, with history of Hodgkin's lymphoma, recovered, sickle cell trait Today she reports Intermittent discomfort in her feet responding to soaking, massage. Contractions: Not present. Vag. Bleeding: None.  Movement: Absent. denies leaking of fluid. Review of Systems:   Pertinent items are noted in HPI Denies abnormal vaginal discharge w/ itching/odor/irritation, headaches, visual changes, shortness of breath, chest pain, abdominal pain, severe nausea/vomiting, or problems with urination or bowel movements unless otherwise stated above. Pertinent History Reviewed:  Reviewed past medical,surgical, social, obstetrical and family history.  Reviewed problem list, medications and allergies. Physical Assessment:   Vitals:   09/27/18 1140  BP: 110/71  Pulse: (!) 109  Weight: 90 lb 3.2 oz (40.9 kg)  Body mass index is 18.22 kg/m.        Physical Examination:   General appearance: Well appearing, and in no distress  Mental status: Alert, oriented to person, place, and time  Skin: Warm & dry  Cardiovascular: Normal heart rate noted  Respiratory: Normal respiratory effort, no distress  Abdomen: Soft, gravid, nontender  Pelvic: Cervical exam deferred         Extremities: Edema: None  Fetal Status:     [place] 13+4 wks,measurements c/w dates,NB present ,NT 1.8 mm,fhr 147 bpm,normal ovaries bilat,crl 77.59 mm,posterior placenta gr 0   Michele Alexander 09/27/2018 11:40 AM  No results found for this or any previous visit (from the past 24 hour(s)).  Assessment & Plan:  1) Low-risk pregnancy [id number] at [redacted]w[redacted]d with an Estimated Date of Delivery: 03/31/19   2) history of Hodgkin's  lymphoma, 3) history of sickle cell trait,   Meds: No orders of the defined types were placed in this encounter.  Labs/procedures today: The patient will have the first blood draw of her integrated screening today and the second draw in approximately 4 weeks.  Plan:  Continue routine obstetrical care ultrasound at 18 1 /2 weeks  Reviewed:  labor symptoms and general obstetric precautions including but not limited to vaginal bleeding, contractions, leaking of fluid and fetal movement were reviewed in detail with the patient.  All questions were answered  Follow-up: No follow-ups on file.  Orders Placed This Encounter  Procedures  . Integrated 1   [place], [facility] 09/27/2018 11:48 AM

## 2018-09-27 NOTE — Progress Notes (Signed)
[place] 13+4 wks,measurements c/w dates,NB present ,NT 1.8 mm,fhr 147 bpm,normal ovaries bilat,crl 77.59 mm,posterior placenta gr 0

## 2018-09-30 LAB — INTEGRATED 1
Crown Rump Length: 77.6 mm
Gest. Age on Collection Date: 13.6 weeks
Maternal Age at EDD: 22.8 yr
Nuchal Translucency (NT): 1.8 mm
Number of Fetuses: 1
PAPP-A Value: 3319.3 ng/mL
Weight: 90 [lb_av]

## 2018-10-15 ENCOUNTER — Telehealth: Payer: Self-pay | Admitting: *Deleted

## 2018-10-15 ENCOUNTER — Other Ambulatory Visit: Payer: Self-pay

## 2018-10-15 ENCOUNTER — Other Ambulatory Visit (INDEPENDENT_AMBULATORY_CARE_PROVIDER_SITE_OTHER): Payer: Medicaid Other | Admitting: *Deleted

## 2018-10-15 ENCOUNTER — Encounter: Payer: Self-pay | Admitting: *Deleted

## 2018-10-15 ENCOUNTER — Telehealth: Payer: Self-pay | Admitting: Obstetrics & Gynecology

## 2018-10-15 VITALS — BP 104/71 | HR 102 | Wt 92.0 lb

## 2018-10-15 DIAGNOSIS — R300 Dysuria: Secondary | ICD-10-CM

## 2018-10-15 DIAGNOSIS — Z3482 Encounter for supervision of other normal pregnancy, second trimester: Secondary | ICD-10-CM

## 2018-10-15 DIAGNOSIS — Z1389 Encounter for screening for other disorder: Secondary | ICD-10-CM

## 2018-10-15 DIAGNOSIS — Z331 Pregnant state, incidental: Secondary | ICD-10-CM

## 2018-10-15 DIAGNOSIS — J029 Acute pharyngitis, unspecified: Secondary | ICD-10-CM

## 2018-10-15 LAB — POCT URINALYSIS DIPSTICK OB
Blood, UA: NEGATIVE
Glucose, UA: NEGATIVE
Ketones, UA: NEGATIVE
Leukocytes, UA: NEGATIVE
Nitrite, UA: NEGATIVE
POC,PROTEIN,UA: NEGATIVE

## 2018-10-15 MED ORDER — TERCONAZOLE 0.4 % VA CREA: 1.0000 | TOPICAL_CREAM | Freq: Every day | VAGINAL | 0 refills | Status: DC

## 2018-10-15 NOTE — Progress Notes (Signed)
Patient states she is having burning with urination but POCT negative.  Also states she has a sore throat.  Visible white spots to throat and uvula.  Discussed with Dr Elonda Husky and will get group A swab on patient.  Will treat if positive. Patient agreeable to plan.  FHT 150.  Also states she has a yeast infection and cannot afford OTC medication. Will send message to Dr Elonda Husky.

## 2018-10-15 NOTE — Telephone Encounter (Signed)
Patient was in office today for urine dip d/t burning with urination. Also state she is thinks she has a yeast infection as she is having a white clumpy discharge along with itching.  States she has not tried anything OTC as she has not had the money.  Would you send in medication for her to her pharmacy?  Thanks.

## 2018-10-15 NOTE — Telephone Encounter (Signed)
Rx: terazol 7

## 2018-10-15 NOTE — Telephone Encounter (Signed)
Meds ordered this encounter  Medications  . terconazole (TERAZOL 7) 0.4 % vaginal cream    Sig: Place 1 applicator vaginally at bedtime.    Dispense:  45 g    Refill:  0

## 2018-10-17 LAB — URINE CULTURE: Organism ID, Bacteria: NO GROWTH

## 2018-10-18 LAB — CULTURE, GROUP A STREP: Strep A Culture: NEGATIVE

## 2018-10-28 ENCOUNTER — Other Ambulatory Visit: Payer: Self-pay | Admitting: Obstetrics and Gynecology

## 2018-10-28 DIAGNOSIS — Z363 Encounter for antenatal screening for malformations: Secondary | ICD-10-CM

## 2018-11-02 ENCOUNTER — Other Ambulatory Visit: Payer: Self-pay

## 2018-11-02 ENCOUNTER — Encounter: Payer: Self-pay | Admitting: Women's Health

## 2018-11-02 ENCOUNTER — Ambulatory Visit (INDEPENDENT_AMBULATORY_CARE_PROVIDER_SITE_OTHER): Payer: Medicaid Other

## 2018-11-02 ENCOUNTER — Ambulatory Visit (INDEPENDENT_AMBULATORY_CARE_PROVIDER_SITE_OTHER): Payer: Medicaid Other | Admitting: Women's Health

## 2018-11-02 DIAGNOSIS — Z3A18 18 weeks gestation of pregnancy: Secondary | ICD-10-CM | POA: Diagnosis not present

## 2018-11-02 DIAGNOSIS — Z363 Encounter for antenatal screening for malformations: Secondary | ICD-10-CM | POA: Diagnosis not present

## 2018-11-02 DIAGNOSIS — Z3482 Encounter for supervision of other normal pregnancy, second trimester: Secondary | ICD-10-CM

## 2018-11-02 DIAGNOSIS — Z3402 Encounter for supervision of normal first pregnancy, second trimester: Secondary | ICD-10-CM

## 2018-11-02 NOTE — Progress Notes (Signed)
   TELEHEALTH VIRTUAL OBSTETRICS VISIT ENCOUNTER NOTE Patient name: Michele Alexander MRN [id number]  Date of birth: [birth year]  I connected with patient on 11/02/18 at 11:00 AM EDT by [facility] and verified that I am speaking with the correct person using two identifiers. Due to COVID-19 recommendations, pt is not currently in our office. Here earlier for anatomy u/s.    I discussed the limitations, risks, security and privacy concerns of performing an evaluation and management service by telephone and the availability of in person appointments. I also discussed with the patient that there may be a patient responsible charge related to this service. The patient expressed understanding and agreed to proceed.  Chief Complaint:   Routine Prenatal Visit  History of Present Illness:   Michele Alexander is a 23 y.o. [phone] female at [redacted]w[redacted]d with an Estimated Date of Delivery: 03/31/19 being evaluated today for ongoing management of a low-risk pregnancy.  Today she reports cramping. Contractions: Irritability. Vag. Bleeding: None.  Movement: Present. denies leaking of fluid. Review of Systems:   Pertinent items are noted in HPI Denies abnormal vaginal discharge w/ itching/odor/irritation, headaches, visual changes, shortness of breath, chest pain, abdominal pain, severe nausea/vomiting, or problems with urination or bowel movements unless otherwise stated above. Pertinent History Reviewed:  Reviewed past medical,surgical, social, obstetrical and family history.  Reviewed problem list, medications and allergies. Physical Assessment:   Vitals:   11/02/18 1149  BP: 100/69  Pulse: 96  There is no height or weight on file to calculate BMI.        Physical Examination:   General:  Alert, oriented and cooperative.   Mental Status: Normal mood and affect perceived. Normal judgment and thought content.  Rest of physical exam deferred due to type of encounter  No results found for this or any previous visit  (from the past 24 hour(s)).  Assessment & Plan:  1) Pregnancy [id number] at [redacted]w[redacted]d with an Estimated Date of Delivery: 03/31/19   2) Cramping, increase po fluids, CL 3.7cm today, reviewed ptl s/s, reasons to seek care   Meds: No orders of the defined types were placed in this encounter.   Labs/procedures today: anatomy u/s  Plan:  Continue routine obstetrical care. Has BP cuff. Check bp weekly, let us know if >140/90.   Reviewed: Preterm labor symptoms and general obstetric precautions including but not limited to vaginal bleeding, contractions, leaking of fluid and fetal movement were reviewed in detail with the patient. The patient was advised to call back or seek an in-person office evaluation/go to [facility] at [facility] for any urgent or concerning symptoms. All questions were answered. Please refer to After Visit Summary for other counseling recommendations.   I provided 15 minutes of non-face-to-face time during this encounter.  Follow-up: Return for this week for 2nd IT, 4wks for LROB webex.  No orders of the defined types were placed in this encounter.  [place], [facility] 11/02/2018 12:26 PM

## 2018-11-02 NOTE — Progress Notes (Signed)
[place] 18+5 wks,breech,posterior placenta gr 0,normal ovaries bilat,svp of fluid 4.9 cm,fhr 150 bpm,efw 291 g 83%,anatomy complete, no obvious abnormalities

## 2018-11-02 NOTE — Patient Instructions (Addendum)
Michele Alexander, I greatly value your feedback.  If you receive a survey following your visit with [place] today, we appreciate you taking the time to fill it out.  Thanks, Knute Neu, CNM, [facility]  [place]!!! It is now [place] at [facility] ([place], [place] [address]) Entrance located off of [address] parking   Home Blood Pressure Monitoring for Patients   Your provider has recommended that you check your blood pressure (BP) at least once a week at home. If you do not have a blood pressure cuff at home, one will be provided for you. Contact your provider if you have not received your monitor within 1 week.   Helpful Tips for Accurate Home Blood Pressure Checks  . Don't smoke, exercise, or drink caffeine 30 minutes before checking your BP . Use the restroom before checking your BP (a full bladder can raise your pressure) . Relax in a comfortable upright chair . Feet on the ground . Left arm resting comfortably on a flat surface at the level of your heart . Legs uncrossed . Back supported . Sit quietly and don't talk . Place the cuff on your bare arm . Adjust snuggly, so that only two fingertips can fit between your skin and the top of the cuff . Check 2 readings separated by at least one minute . Keep a log of your BP readings . For a visual, please reference this diagram: http://ccnc.care/bpdiagram  Provider Name: [facility]     Phone: [phone]  Zone 1: ALL CLEAR  Continue to monitor your symptoms:  . BP reading is less than 140 (top number) or less than 90 (bottom number)  . No right upper stomach pain . No headaches or seeing spots . No feeling nauseated or throwing up . No swelling in face and hands  Zone 2: CAUTION Call your doctor's office for any of the following:  . BP reading is greater than 140 (top number) or greater than 90 (bottom number)  . Stomach pain under your ribs in the middle  or right side . Headaches or seeing spots . Feeling nauseated or throwing up . Swelling in face and hands  Zone 3: EMERGENCY  Seek immediate medical care if you have any of the following:  . BP reading is greater than160 (top number) or greater than 110 (bottom number) . Severe headaches not improving with Tylenol . Serious difficulty catching your breath . Any worsening symptoms from Zone 2     Second Trimester of Pregnancy The second trimester is from week 14 through week 27 (months 4 through 6). The second trimester is often a time when you feel your best. Your body has adjusted to being pregnant, and you begin to feel better physically. Usually, morning sickness has lessened or quit completely, you may have more energy, and you may have an increase in appetite. The second trimester is also a time when the fetus is growing rapidly. At the end of the sixth month, the fetus is about 9 inches long and weighs about 1 pounds. You will likely begin to feel the baby move (quickening) between 16 and 20 weeks of pregnancy. Body changes during your second trimester Your body continues to go through many changes during your second trimester. The changes vary from woman to woman.  Your weight will continue to increase. You will notice your lower abdomen bulging out.  You may begin to get stretch  marks on your hips, abdomen, and breasts.  You may develop headaches that can be relieved by medicines. The medicines should be approved by your health care provider.  You may urinate more often because the fetus is pressing on your bladder.  You may develop or continue to have heartburn as a result of your pregnancy.  You may develop constipation because certain hormones are causing the muscles that push waste through your intestines to slow down.  You may develop hemorrhoids or swollen, bulging veins (varicose veins).  You may have back pain. This is caused by: ? Weight gain. ? Pregnancy hormones  that are relaxing the joints in your pelvis. ? A shift in weight and the muscles that support your balance.  Your breasts will continue to grow and they will continue to become tender.  Your gums may bleed and may be sensitive to brushing and flossing.  Dark spots or blotches (chloasma, mask of pregnancy) may develop on your face. This will likely fade after the baby is born.  A dark line from your belly button to the pubic area (linea nigra) may appear. This will likely fade after the baby is born.  You may have changes in your hair. These can include thickening of your hair, rapid growth, and changes in texture. Some women also have hair loss during or after pregnancy, or hair that feels dry or thin. Your hair will most likely return to normal after your baby is born.  What to expect at prenatal visits During a routine prenatal visit:  You will be weighed to make sure you and the fetus are growing normally.  Your blood pressure will be taken.  Your abdomen will be measured to track your baby's growth.  The fetal heartbeat will be listened to.  Any test results from the previous visit will be discussed.  Your health care provider may ask you:  How you are feeling.  If you are feeling the baby move.  If you have had any abnormal symptoms, such as leaking fluid, bleeding, severe headaches, or abdominal cramping.  If you are using any tobacco products, including cigarettes, chewing tobacco, and electronic cigarettes.  If you have any questions.  Other tests that may be performed during your second trimester include:  Blood tests that check for: ? Low iron levels (anemia). ? High blood sugar that affects pregnant women (gestational diabetes) between 42 and 28 weeks. ? Rh antibodies. This is to check for a protein on red blood cells (Rh factor).  Urine tests to check for infections, diabetes, or protein in the urine.  An ultrasound to confirm the proper growth and  development of the baby.  An amniocentesis to check for possible genetic problems.  Fetal screens for spina bifida and Down syndrome.  HIV (human immunodeficiency virus) testing. Routine prenatal testing includes screening for HIV, unless you choose not to have this test.  Follow these instructions at home: Medicines  Follow your health care provider's instructions regarding medicine use. Specific medicines may be either safe or unsafe to take during pregnancy.  Take a prenatal vitamin that contains at least 600 micrograms (mcg) of folic acid.  If you develop constipation, try taking a stool softener if your health care provider approves. Eating and drinking  Eat a balanced diet that includes fresh fruits and vegetables, whole grains, good sources of protein such as meat, eggs, or tofu, and low-fat dairy. Your health care provider will help you determine the amount of weight gain that is  right for you.  Avoid raw meat and uncooked cheese. These carry germs that can cause birth defects in the baby.  If you have low calcium intake from food, talk to your health care provider about whether you should take a daily calcium supplement.  Limit foods that are high in fat and processed sugars, such as fried and sweet foods.  To prevent constipation: ? Drink enough fluid to keep your urine clear or pale yellow. ? Eat foods that are high in fiber, such as fresh fruits and vegetables, whole grains, and beans. Activity  Exercise only as directed by your health care provider. Most women can continue their usual exercise routine during pregnancy. Try to exercise for 30 minutes at least 5 days a week. Stop exercising if you experience uterine contractions.  Avoid heavy lifting, wear low heel shoes, and practice good posture.  A sexual relationship may be continued unless your health care provider directs you otherwise. Relieving pain and discomfort  Wear a good support bra to prevent discomfort  from breast tenderness.  Take warm sitz baths to soothe any pain or discomfort caused by hemorrhoids. Use hemorrhoid cream if your health care provider approves.  Rest with your legs elevated if you have leg cramps or low back pain.  If you develop varicose veins, wear support hose. Elevate your feet for 15 minutes, 3-4 times a day. Limit salt in your diet. Prenatal Care  Write down your questions. Take them to your prenatal visits.  Keep all your prenatal visits as told by your health care provider. This is important. Safety  Wear your seat belt at all times when driving.  Make a list of emergency phone numbers, including numbers for family, friends, the hospital, and police and fire departments. General instructions  Ask your health care provider for a referral to a local prenatal education class. Begin classes no later than the beginning of month 6 of your pregnancy.  Ask for help if you have counseling or nutritional needs during pregnancy. Your health care provider can offer advice or refer you to specialists for help with various needs.  Do not use hot tubs, steam rooms, or saunas.  Do not douche or use tampons or scented sanitary pads.  Do not cross your legs for long periods of time.  Avoid cat litter boxes and soil used by cats. These carry germs that can cause birth defects in the baby and possibly loss of the fetus by miscarriage or stillbirth.  Avoid all smoking, herbs, alcohol, and unprescribed drugs. Chemicals in these products can affect the formation and growth of the baby.  Do not use any products that contain nicotine or tobacco, such as cigarettes and e-cigarettes. If you need help quitting, ask your health care provider.  Visit your dentist if you have not gone yet during your pregnancy. Use a soft toothbrush to brush your teeth and be gentle when you floss. Contact a health care provider if:  You have dizziness.  You have mild pelvic cramps, pelvic  pressure, or nagging pain in the abdominal area.  You have persistent nausea, vomiting, or diarrhea.  You have a bad smelling vaginal discharge.  You have pain when you urinate. Get help right away if:  You have a fever.  You are leaking fluid from your vagina.  You have spotting or bleeding from your vagina.  You have severe abdominal cramping or pain.  You have rapid weight gain or weight loss.  You have shortness of breath with  chest pain.  You notice sudden or extreme swelling of your face, hands, ankles, feet, or legs.  You have not felt your baby move in over an hour.  You have severe headaches that do not go away when you take medicine.  You have vision changes. Summary  The second trimester is from week 14 through week 27 (months 4 through 6). It is also a time when the fetus is growing rapidly.  Your body goes through many changes during pregnancy. The changes vary from woman to woman.  Avoid all smoking, herbs, alcohol, and unprescribed drugs. These chemicals affect the formation and growth your baby.  Do not use any tobacco products, such as cigarettes, chewing tobacco, and e-cigarettes. If you need help quitting, ask your health care provider.  Contact your health care provider if you have any questions. Keep all prenatal visits as told by your health care provider. This is important. This information is not intended to replace advice given to you by your health care provider. Make sure you discuss any questions you have with your health care provider. Document Released: 05/20/2001 Document Revised: 11/01/2015 Document Reviewed: 07/27/2012 Elsevier Interactive Patient Education  2017 [place]? Guide for patients at [facility] [employer]  Why consider waterbirth?  . Gentle birth for babies . Less pain medicine used in labor . May allow for passive descent/less pushing . May reduce perineal tears  . More mobility and  instinctive maternal position changes . Increased maternal relaxation . Reduced blood pressure in labor  Is waterbirth safe? What are the risks of infection, drowning or other complications?  . Infection: o Very low risk (3.7 % for tub vs 4.8% for bed) o 7 in 8000 waterbirths with documented infection o Poorly cleaned equipment most common cause o Slightly lower group B strep transmission rate  . Drowning o Maternal:  - Very low risk   - Related to seizures or fainting o Newborn:  - Very low risk. No evidence of increased risk of respiratory problems in multiple large studies - Physiological protection from breathing under water - Avoid underwater birth if there are any fetal complications - Once baby's head is out of the water, keep it out.  . Birth complication o Some reports of cord trauma, but risk decreased by bringing baby to surface gradually o No evidence of increased risk of shoulder dystocia. Mothers can usually change positions faster in water than in a bed, possibly aiding the maneuvers to free the shoulder.   You must attend a Doren Custard class at [facility]  3rd Wednesday of every month from 7-9pm  [employer] by calling [phone] or online at [email or web address]  Bring [place] the certificate from the class to your prenatal appointment  Meet with a midwife at 36 weeks to see if you can still plan a waterbirth and to sign the consent.   If you plan a waterbirth after August 01, 2018, at [facility], you will need to purchase the following:  Fish net  Bathing suit top (optional)  Long-handled mirror (optional)  If you plan a waterbirth before August 01, 2018: Purchase or rent the following supplies: You are responsible for providing all supplies listed above. **If you do not have all necessary supplies you cannot have a waterbirth.**   Water Birth Pool (Birth Pool in a Box or [place] for instance)   (Tubs start ~$125)  Single-use disposable tub liner designed for your brand of  tub  Electric drain pump to remove water (We recommend 792 gallon per hour or greater pump.)   New garden hose labeled "lead-free", "suitable for drinking water",  Separate garden hose to remove the dirty water  Fish net  Bathing suit top (optional)  Long-handled mirror (optional)  Places to purchase or rent supplies:   [employer] for tub purchases and supplies  [employer]com for tub purchases and supplies  [employer] (www.thelaborladies.com) $275 for tub rental/set-up & take down/kit   [employer] (http://www.[place].com/.htm) Information regarding doulas (labor support) who provide pool rentals  Things that would prevent you from having a waterbirth:  Premature, <37wks  Previous cesarean birth  Presence of thick meconium-stained fluid  Multiple gestation (Twins, triplets, etc.)  Uncontrolled diabetes or gestational diabetes requiring medication  Hypertension requiring medication or diagnosis of pre-eclampsia  Heavy vaginal bleeding  Non-reassuring fetal heart rate  Active infection (MRSA, etc.). Group B Strep is NOT a contraindication for waterbirth.  If your labor has to be induced and induction method requires continuous monitoring of the baby's heart rate  Other risks/issues identified by your obstetrical provider  Please remember that birth is unpredictable. Under certain unforeseeable circumstances your provider may advise against giving birth in the tub. These decisions will be made on a case-by-case basis and with the safety of you and your baby as our highest priority.

## 2018-11-03 ENCOUNTER — Other Ambulatory Visit: Payer: Medicaid Other

## 2018-11-04 LAB — INTEGRATED 2

## 2018-11-05 LAB — INTEGRATED 2

## 2018-11-06 LAB — INTEGRATED 2
AFP MoM: 1.5
Alpha-Fetoprotein: 87.6 ng/mL
Crown Rump Length: 77.6 mm
DIA MoM: 1.04
DIA Value: 256.4 pg/mL
Estriol, Unconjugated: 2.8 ng/mL
Gest. Age on Collection Date: 13.6 weeks
Gestational Age: 18.9 weeks
Maternal Age at EDD: 22.8 yr
Nuchal Translucency (NT): 1.8 mm
Nuchal Translucency MoM: 0.99
Number of Fetuses: 1
PAPP-A MoM: 1.25
PAPP-A Value: 3319.3 ng/mL
Test Results:: NEGATIVE
Weight: 90 [lb_av]
Weight: 90 [lb_av]
hCG MoM: 1.33
hCG Value: 39.9 IU/mL
uE3 MoM: 1.54

## 2018-11-15 ENCOUNTER — Encounter: Payer: Self-pay | Admitting: *Deleted

## 2018-11-26 ENCOUNTER — Inpatient Hospital Stay (HOSPITAL_COMMUNITY)
Admission: AD | Admit: 2018-11-26 | Discharge: 2018-11-27 | Disposition: A | Discharge status: Home / Self Care | Payer: Medicaid Other | Source: Ambulatory Visit | Attending: Obstetrics & Gynecology | Admitting: Obstetrics & Gynecology

## 2018-11-26 ENCOUNTER — Other Ambulatory Visit: Payer: Self-pay

## 2018-11-26 DIAGNOSIS — R109 Unspecified abdominal pain: Secondary | ICD-10-CM | POA: Insufficient documentation

## 2018-11-26 DIAGNOSIS — N760 Acute vaginitis: Secondary | ICD-10-CM | POA: Insufficient documentation

## 2018-11-26 DIAGNOSIS — O4703 False labor before 37 completed weeks of gestation, third trimester: Secondary | ICD-10-CM | POA: Insufficient documentation

## 2018-11-26 DIAGNOSIS — O26892 Other specified pregnancy related conditions, second trimester: Secondary | ICD-10-CM

## 2018-11-26 DIAGNOSIS — R102 Pelvic and perineal pain: Secondary | ICD-10-CM | POA: Insufficient documentation

## 2018-11-26 DIAGNOSIS — B9689 Other specified bacterial agents as the cause of diseases classified elsewhere: Secondary | ICD-10-CM

## 2018-11-26 DIAGNOSIS — D649 Anemia, unspecified: Secondary | ICD-10-CM | POA: Insufficient documentation

## 2018-11-26 DIAGNOSIS — O99012 Anemia complicating pregnancy, second trimester: Secondary | ICD-10-CM | POA: Insufficient documentation

## 2018-11-26 DIAGNOSIS — Z3A22 22 weeks gestation of pregnancy: Secondary | ICD-10-CM | POA: Insufficient documentation

## 2018-11-26 DIAGNOSIS — D573 Sickle-cell trait: Secondary | ICD-10-CM | POA: Insufficient documentation

## 2018-11-26 DIAGNOSIS — O9989 Other specified diseases and conditions complicating pregnancy, childbirth and the puerperium: Secondary | ICD-10-CM | POA: Insufficient documentation

## 2018-11-27 ENCOUNTER — Encounter (HOSPITAL_COMMUNITY): Payer: Self-pay

## 2018-11-27 DIAGNOSIS — O99012 Anemia complicating pregnancy, second trimester: Secondary | ICD-10-CM | POA: Diagnosis not present

## 2018-11-27 DIAGNOSIS — O26892 Other specified pregnancy related conditions, second trimester: Secondary | ICD-10-CM | POA: Diagnosis not present

## 2018-11-27 DIAGNOSIS — N760 Acute vaginitis: Secondary | ICD-10-CM | POA: Diagnosis not present

## 2018-11-27 DIAGNOSIS — O9989 Other specified diseases and conditions complicating pregnancy, childbirth and the puerperium: Secondary | ICD-10-CM | POA: Diagnosis not present

## 2018-11-27 DIAGNOSIS — B9689 Other specified bacterial agents as the cause of diseases classified elsewhere: Secondary | ICD-10-CM | POA: Diagnosis not present

## 2018-11-27 DIAGNOSIS — O4703 False labor before 37 completed weeks of gestation, third trimester: Secondary | ICD-10-CM | POA: Diagnosis present

## 2018-11-27 DIAGNOSIS — R109 Unspecified abdominal pain: Secondary | ICD-10-CM | POA: Diagnosis not present

## 2018-11-27 DIAGNOSIS — Z3A22 22 weeks gestation of pregnancy: Secondary | ICD-10-CM | POA: Diagnosis not present

## 2018-11-27 DIAGNOSIS — O23592 Infection of other part of genital tract in pregnancy, second trimester: Secondary | ICD-10-CM

## 2018-11-27 DIAGNOSIS — D573 Sickle-cell trait: Secondary | ICD-10-CM | POA: Diagnosis not present

## 2018-11-27 DIAGNOSIS — R102 Pelvic and perineal pain: Secondary | ICD-10-CM | POA: Diagnosis not present

## 2018-11-27 DIAGNOSIS — D649 Anemia, unspecified: Secondary | ICD-10-CM | POA: Diagnosis not present

## 2018-11-27 LAB — URINALYSIS, ROUTINE W REFLEX MICROSCOPIC
Bilirubin Urine: NEGATIVE
Glucose, UA: NEGATIVE mg/dL
Hgb urine dipstick: NEGATIVE
Ketones, ur: 80 mg/dL — AB
Leukocytes,Ua: NEGATIVE
Nitrite: NEGATIVE
Protein, ur: NEGATIVE mg/dL
Specific Gravity, Urine: 1.017 (ref 1.005–1.030)
pH: 5 (ref 5.0–8.0)

## 2018-11-27 LAB — WET PREP, GENITAL
Sperm: NONE SEEN
Trich, Wet Prep: NONE SEEN
Yeast Wet Prep HPF POC: NONE SEEN

## 2018-11-27 MED ORDER — CYCLOBENZAPRINE HCL 10 MG PO TABS
5.0000 mg | ORAL_TABLET | Freq: Once | ORAL | Status: AC
  Administered 2018-11-27: 5 mg via ORAL
  Filled 2018-11-27: qty 1

## 2018-11-27 MED ORDER — METRONIDAZOLE 500 MG PO TABS: 500.0000 mg | ORAL_TABLET | Freq: Two times a day (BID) | ORAL | 0 refills | Status: DC

## 2018-11-27 MED ORDER — METRONIDAZOLE 500 MG PO TABS
500.0000 mg | ORAL_TABLET | Freq: Once | ORAL | Status: AC
  Administered 2018-11-27: 500 mg via ORAL
  Filled 2018-11-27: qty 1

## 2018-11-27 NOTE — MAU Provider Note (Signed)
History     CSN: [id number]  Arrival date and time: 11/26/18 2359   First Provider Initiated Contact with Patient 11/27/18 0038      Chief Complaint  Patient presents with  . Contractions   Michele Alexander is a 23 y.o. [id number] at [redacted]w[redacted]d who receives care at [facility].  She presents today for Contractions.  She states she has been having "Braxton-hicks" all day long and started having constant pain around 6pm.  She states the pain is in her lower abdominal area and feels like "tight and a contraction that is constantly there."  She rates the pain as a 7/10 and reports taking Tylenol 4-6 hours ago without improvement.  She states that she has had about 8 bottles of water today, maybe more.  She reports frequent urination that she noticed at 6pm as well. However, she denies pain or discomfort with urination.  She endorses fetal movement and states "she is moving way more than usual."  Patient reports that she feels her baby is "very low."  She denies vaginal leaking, bleeding, or discharge.  Patient states she does not wear a belly support band or belt, but acknowledges that she should.       OB History    Gravida  7   Para  1   Term  1   Preterm  0   AB  5   Living  1     SAB  5   TAB  0   Ectopic  0   Multiple  0   Live Births  1           Past Medical History:  Diagnosis Date  . Anemia   . Anxiety   . Blood transfusion without reported diagnosis   . Cancer ([place])    Hodgkin's Lymphoma  . Depression   . Hodgkin's disease(201)   . Sickle cell trait ([place])    traits    Past Surgical History:  Procedure Laterality Date  . DILATION AND EVACUATION N/A 12/21/2015   Procedure: DILATATION AND EVACUATION;  Surgeon: Will Bonnet, MD;  Location: [facility] ORS;  Service: Gynecology;  Laterality: N/A;  . NECK LESION BIOPSY    . OTHER SURGICAL HISTORY N/A 2015   Endoscopy  . PORT-A-CATH REMOVAL    . PORTACATH PLACEMENT Left 2010    Family History  Problem  Relation Age of Onset  . Hypertension Mother   . Diabetes Maternal Grandfather   . Hypertension Maternal Grandfather   . Cancer Maternal Grandfather   . Cancer Maternal Grandmother   . Asthma Sister   . Asthma Daughter     Social History   Tobacco Use  . Smoking status: Never Smoker  . Smokeless tobacco: Never Used  Substance Use Topics  . Alcohol use: No  . Drug use: No    Allergies:  Allergies  Allergen Reactions  . Other Hives    Oral CT contrast Oral CT contrast  . Diatrizoate Hives    PO only.  Tolerates IV contrast.  . Iodinated Diagnostic Agents Hives  . Nexium [Esomeprazole Magnesium] Swelling  . Cheese Rash and Swelling    Lip swelling    Medications Prior to Admission  Medication Sig Dispense Refill Last Dose  . Doxylamine-Pyridoxine (DICLEGIS) 10-10 MG TBEC 2 tabs q hs, if sx persist add 1 tab q am on day 3, if sx persist add 1 tab q afternoon on day 4 100 tablet 6 11/26/2018 at Unknown time  . Prenatal Vit-Fe  Fumarate-FA (PRENATAL VITAMIN PO) Take by mouth.   11/26/2018 at Unknown time  . terconazole (TERAZOL 7) 0.4 % vaginal cream Place 1 applicator vaginally at bedtime. 45 g 0 Past Month at Unknown time  . prochlorperazine (COMPAZINE) 10 MG tablet Take 10 mg by mouth 3 (three) times daily as needed.   More than a month at Unknown time    Review of Systems  Constitutional: Negative for chills and fever.  Respiratory: Negative for cough and shortness of breath.   Gastrointestinal: Positive for abdominal pain. Negative for constipation, diarrhea, nausea and vomiting.  Genitourinary: Positive for pelvic pain. Negative for difficulty urinating, dysuria, vaginal bleeding and vaginal discharge.  Musculoskeletal: Negative for back pain.  Neurological: Negative for dizziness, light-headedness and headaches.   Physical Exam   Blood pressure 105/62, pulse (!) 113, temperature 98.5 F (36.9 C), temperature source Oral, resp. rate 16, height 4\' 11"  (1.499 m),  weight 43.2 kg, last menstrual period 06/24/2018, SpO2 100 %, unknown if currently breastfeeding.  Physical Exam  Constitutional: She is oriented to person, place, and time. She appears well-developed and well-nourished. She appears distressed.  HENT:  Head: Normocephalic and atraumatic.  Eyes: Conjunctivae are normal.  Neck: Normal range of motion.  Cardiovascular: Normal rate.  Respiratory: Effort normal.  GI: Soft. There is abdominal tenderness. There is no guarding.  Genitourinary: Cervix exhibits no motion tenderness, no discharge and no friability.    Vaginal discharge present.     No vaginal bleeding.  No bleeding in the vagina.    Genitourinary Comments: Speculum Exam: -Vaginal Vault: Pink mucosa.  Small amt thin white discharge in vault -wet prep collected -Cervix:Pink and Ecotropic, no lesions, cysts, or polyps.  Appears closed. No active bleeding or discharge from os-GC/CT collected -Bimanual Exam: Closed/Long/Thick Tenderness in cul de sac at symphysis pubis     Musculoskeletal: Normal range of motion.        General: No edema.  Neurological: She is alert and oriented to person, place, and time.  Skin: Skin is warm and dry.  Psychiatric: She has a normal mood and affect. Her behavior is normal.    MAU Course  Procedures Results for orders placed or performed during the hospital encounter of 11/26/18 (from the past 24 hour(s))  Urinalysis, Routine w reflex microscopic     Status: Abnormal   Collection Time: 11/27/18 12:06 AM  Result Value Ref Range   Color, Urine YELLOW YELLOW   APPearance CLEAR CLEAR   Specific Gravity, Urine 1.017 1.005 - 1.030   pH 5.0 5.0 - 8.0   Glucose, UA NEGATIVE NEGATIVE mg/dL   Hgb urine dipstick NEGATIVE NEGATIVE   Bilirubin Urine NEGATIVE NEGATIVE   Ketones, ur 80 (A) NEGATIVE mg/dL   Protein, ur NEGATIVE NEGATIVE mg/dL   Nitrite NEGATIVE NEGATIVE   Leukocytes,Ua NEGATIVE NEGATIVE  Wet prep, genital     Status: Abnormal   Collection  Time: 11/27/18 12:51 AM   Specimen: Thin Prep Cervical/Endocervical  Result Value Ref Range   Yeast Wet Prep HPF POC NONE SEEN NONE SEEN   Trich, Wet Prep NONE SEEN NONE SEEN   Clue Cells Wet Prep HPF POC PRESENT (A) NONE SEEN   WBC, Wet Prep HPF POC FEW (A) NONE SEEN   Sperm NONE SEEN     MDM Pelvic Exam with cultures Labs: UA, Wet prep, and GC/CT Pain Medication Assessment and Plan  23 year old [id number] at 22.2 weeks Abdominal Pain  -Exam findings discussed. -Labs collected and sent. -Discussed  giving flexeril to assist with pain since described at "tight and contracting." -Patient without questions or concerns. -Will await lab results.    Follow Up (2:07 AM) Bacterial Vaginosis  -Wet prep returns significant for clue cells -Results discussed with patient -Rx for Metronidazole 500mg  BID x 7 days sent to pharmacy on file.  -Will given initial dose now. -Discussed how BV can cause cramping/contractions during pregnancy. -Discussed ways to prevent bacterial vaginosis:  +Wear cotton underwear +Use low scent or scent free soaps and detergents +Condom usage -Informed that GC/CT pending. -No questions or concerns. -Encouraged to call or return to MAU if symptoms worsen or with the onset of new symptoms. -Discharged to home in improved condition.  Maryann Conners MSN, CNM 11/27/2018, 12:38 AM

## 2018-11-27 NOTE — MAU Note (Signed)
Pt reports braxton hicks contractions all day, but around 1800 the pain became more constant and painful. Pt reports a lot of pelvic pain. Denies vaginal bleeding or discharge. Reports increased in frequency with urination but denies burning or pain.

## 2018-11-27 NOTE — Discharge Instructions (Signed)

## 2018-11-29 LAB — GC/CHLAMYDIA PROBE AMP (~~LOC~~) NOT AT ARMC
Chlamydia: NEGATIVE
Neisseria Gonorrhea: NEGATIVE

## 2018-11-30 ENCOUNTER — Encounter: Payer: Self-pay | Admitting: Women's Health

## 2018-11-30 ENCOUNTER — Ambulatory Visit (INDEPENDENT_AMBULATORY_CARE_PROVIDER_SITE_OTHER): Payer: Medicaid Other | Admitting: Women's Health

## 2018-11-30 ENCOUNTER — Other Ambulatory Visit: Payer: Self-pay

## 2018-11-30 VITALS — BP 98/67 | HR 91 | Wt 95.0 lb

## 2018-11-30 DIAGNOSIS — Z3482 Encounter for supervision of other normal pregnancy, second trimester: Secondary | ICD-10-CM

## 2018-11-30 DIAGNOSIS — Z3A22 22 weeks gestation of pregnancy: Secondary | ICD-10-CM

## 2018-11-30 NOTE — Patient Instructions (Signed)
Michele Alexander, I greatly value your feedback.  If you receive a survey following your visit with [place] today, we appreciate you taking the time to fill it out.  Thanks, Knute Neu, CNM, WHNP-BC   You will have your sugar test next visit.  Please do not eat or drink anything after midnight the night before you come, not even water.  You will be here for at least two hours.     [place]!!! It is now [facility] ([place], [place] [address]) Entrance located off of [address] parking   Call the office [phone]) or go to [facility] if:  You begin to have strong, frequent contractions  Your water breaks.  Sometimes it is a big gush of fluid, sometimes it is just a trickle that keeps getting your panties wet or running down your legs  You have vaginal bleeding.  It is normal to have a small amount of spotting if your cervix was checked.   You don't feel your baby moving like normal.  If you don't, get you something to eat and drink and lay down and focus on feeling your baby move.   If your baby is still not moving like normal, you should call the office or go to [place] Blood Pressure Monitoring for Patients   Your provider has recommended that you check your blood pressure (BP) at least once a week at home. If you do not have a blood pressure cuff at home, one will be provided for you. Contact your provider if you have not received your monitor within 1 week.   Helpful Tips for Accurate Home Blood Pressure Checks  . Don't smoke, exercise, or drink caffeine 30 minutes before checking your BP . Use the restroom before checking your BP (a full bladder can raise your pressure) . Relax in a comfortable upright chair . Feet on the ground . Left arm resting comfortably on a flat surface at the level of your heart . Legs uncrossed . Back supported . Sit quietly and don't talk . Place the cuff on  your bare arm . Adjust snuggly, so that only two fingertips can fit between your skin and the top of the cuff . Check 2 readings separated by at least one minute . Keep a log of your BP readings . For a visual, please reference this diagram: http://ccnc.care/bpdiagram  Provider Name: [facility]     Phone: [phone]  Zone 1: ALL CLEAR  Continue to monitor your symptoms:  . BP reading is less than 140 (top number) or less than 90 (bottom number)  . No right upper stomach pain . No headaches or seeing spots . No feeling nauseated or throwing up . No swelling in face and hands  Zone 2: CAUTION Call your doctor's office for any of the following:  . BP reading is greater than 140 (top number) or greater than 90 (bottom number)  . Stomach pain under your ribs in the middle or right side . Headaches or seeing spots . Feeling nauseated or throwing up . Swelling in face and hands  Zone 3: EMERGENCY  Seek immediate medical care if you have any of the following:  . BP reading is greater than160 (top number) or greater than 110 (bottom number) . Severe headaches not improving with Tylenol . Serious difficulty catching your breath . Any worsening symptoms from Zone 2  Second Trimester of Pregnancy The second trimester is from week 13 through week 28, months 4 through 6. The second trimester is often a time when you feel your best. Your body has also adjusted to being pregnant, and you begin to feel better physically. Usually, morning sickness has lessened or quit completely, you may have more energy, and you may have an increase in appetite. The second trimester is also a time when the fetus is growing rapidly. At the end of the sixth month, the fetus is about 9 inches long and weighs about 1 pounds. You will likely begin to feel the baby move (quickening) between 18 and 20 weeks of the pregnancy. BODY CHANGES Your body goes through many changes during pregnancy. The changes vary  from woman to woman.   Your weight will continue to increase. You will notice your lower abdomen bulging out.  You may begin to get stretch marks on your hips, abdomen, and breasts.  You may develop headaches that can be relieved by medicines approved by your health care provider.  You may urinate more often because the fetus is pressing on your bladder.  You may develop or continue to have heartburn as a result of your pregnancy.  You may develop constipation because certain hormones are causing the muscles that push waste through your intestines to slow down.  You may develop hemorrhoids or swollen, bulging veins (varicose veins).  You may have back pain because of the weight gain and pregnancy hormones relaxing your joints between the bones in your pelvis and as a result of a shift in weight and the muscles that support your balance.  Your breasts will continue to grow and be tender.  Your gums may bleed and may be sensitive to brushing and flossing.  Dark spots or blotches (chloasma, mask of pregnancy) may develop on your face. This will likely fade after the baby is born.  A dark line from your belly button to the pubic area (linea nigra) may appear. This will likely fade after the baby is born.  You may have changes in your hair. These can include thickening of your hair, rapid growth, and changes in texture. Some women also have hair loss during or after pregnancy, or hair that feels dry or thin. Your hair will most likely return to normal after your baby is born. WHAT TO EXPECT AT YOUR PRENATAL VISITS During a routine prenatal visit:  You will be weighed to make sure you and the fetus are growing normally.  Your blood pressure will be taken.  Your abdomen will be measured to track your baby's growth.  The fetal heartbeat will be listened to.  Any test results from the previous visit will be discussed. Your health care provider may ask you:  How you are feeling.  If  you are feeling the baby move.  If you have had any abnormal symptoms, such as leaking fluid, bleeding, severe headaches, or abdominal cramping.  If you have any questions. Other tests that may be performed during your second trimester include:  Blood tests that check for:  Low iron levels (anemia).  Gestational diabetes (between 24 and 28 weeks).  Rh antibodies.  Urine tests to check for infections, diabetes, or protein in the urine.  An ultrasound to confirm the proper growth and development of the baby.  An amniocentesis to check for possible genetic problems.  Fetal screens for spina bifida and Down syndrome. HOME CARE INSTRUCTIONS   Avoid all smoking, herbs, alcohol, and  unprescribed drugs. These chemicals affect the formation and growth of the baby.  Follow your health care provider's instructions regarding medicine use. There are medicines that are either safe or unsafe to take during pregnancy.  Exercise only as directed by your health care provider. Experiencing uterine cramps is a good sign to stop exercising.  Continue to eat regular, healthy meals.  Wear a good support bra for breast tenderness.  Do not use hot tubs, steam rooms, or saunas.  Wear your seat belt at all times when driving.  Avoid raw meat, uncooked cheese, cat litter boxes, and soil used by cats. These carry germs that can cause birth defects in the baby.  Take your prenatal vitamins.  Try taking a stool softener (if your health care provider approves) if you develop constipation. Eat more high-fiber foods, such as fresh vegetables or fruit and whole grains. Drink plenty of fluids to keep your urine clear or pale yellow.  Take warm sitz baths to soothe any pain or discomfort caused by hemorrhoids. Use hemorrhoid cream if your health care provider approves.  If you develop varicose veins, wear support hose. Elevate your feet for 15 minutes, 3-4 times a day. Limit salt in your diet.  Avoid  heavy lifting, wear low heel shoes, and practice good posture.  Rest with your legs elevated if you have leg cramps or low back pain.  Visit your dentist if you have not gone yet during your pregnancy. Use a soft toothbrush to brush your teeth and be gentle when you floss.  A sexual relationship may be continued unless your health care provider directs you otherwise.  Continue to go to all your prenatal visits as directed by your health care provider. SEEK MEDICAL CARE IF:   You have dizziness.  You have mild pelvic cramps, pelvic pressure, or nagging pain in the abdominal area.  You have persistent nausea, vomiting, or diarrhea.  You have a bad smelling vaginal discharge.  You have pain with urination. SEEK IMMEDIATE MEDICAL CARE IF:   You have a fever.  You are leaking fluid from your vagina.  You have spotting or bleeding from your vagina.  You have severe abdominal cramping or pain.  You have rapid weight gain or loss.  You have shortness of breath with chest pain.  You notice sudden or extreme swelling of your face, hands, ankles, feet, or legs.  You have not felt your baby move in over an hour.  You have severe headaches that do not go away with medicine.  You have vision changes. Document Released: 05/20/2001 Document Revised: 05/31/2013 Document Reviewed: 07/27/2012 [facility] Patient Information 2015 [place], [place]. This information is not intended to replace advice given to you by your health care provider. Make sure you discuss any questions you have with your health care provider.

## 2018-11-30 NOTE — Progress Notes (Signed)
   TELEHEALTH VIRTUAL OBSTETRICS VISIT ENCOUNTER NOTE Patient name: Michele Alexander MRN [id number]  Date of birth: 06-13-1995  I connected with patient on 11/30/18 at  2:15 PM EDT by Webex and verified that I am speaking with the correct person using two identifiers. Due to COVID-19 recommendations, pt is not currently in our office.    I discussed the limitations, risks, security and privacy concerns of performing an evaluation and management service by telephone and the availability of in person appointments. I also discussed with the patient that there may be a patient responsible charge related to this service. The patient expressed understanding and agreed to proceed.  Chief Complaint:   Routine Prenatal Visit  History of Present Illness:   Michele Alexander is a 23 y.o. [phone] female at [redacted]w[redacted]d with an Estimated Date of Delivery: 03/31/19 being evaluated today for ongoing management of a low-risk pregnancy.  Today she reports went to MAU few days ago w/ contractions, dx w/ BV, taking metronidazole, cramping improved. Contractions: Irritability. Vag. Bleeding: None.  Movement: Present. denies leaking of fluid. Review of Systems:   Pertinent items are noted in HPI Denies abnormal vaginal discharge w/ itching/odor/irritation, headaches, visual changes, shortness of breath, chest pain, abdominal pain, severe nausea/vomiting, or problems with urination or bowel movements unless otherwise stated above. Pertinent History Reviewed:  Reviewed past medical,surgical, social, obstetrical and family history.  Reviewed problem list, medications and allergies. Physical Assessment:   Vitals:   11/30/18 1426  BP: 98/67  Pulse: 91  Weight: 95 lb (43.1 kg)  Body mass index is 19.19 kg/m.        Physical Examination:   General:  Alert, oriented and cooperative.   Mental Status: Normal mood and affect perceived. Normal judgment and thought content.  Rest of physical exam deferred due to type of encounter   No results found for this or any previous visit (from the past 24 hour(s)).  Assessment & Plan:  1) Pregnancy [id number] at [redacted]w[redacted]d with an Estimated Date of Delivery: 03/31/19    Meds: No orders of the defined types were placed in this encounter.  Labs/procedures today: none  Plan:  Continue routine obstetrical care. Has BP cuff. Check bp weekly, let us know if >140/90.   Reviewed: Preterm labor symptoms and general obstetric precautions including but not limited to vaginal bleeding, contractions, leaking of fluid and fetal movement were reviewed in detail with the patient. The patient was advised to call back or seek an in-person office evaluation/go to MAU at [facility] for any urgent or concerning symptoms. All questions were answered. Please refer to After Visit Summary for other counseling recommendations.   I provided 15 minutes of non-face-to-face time during this encounter.  Follow-up: Return in about 1 month (around 12/30/2018) for LROB, PN2.  No orders of the defined types were placed in this encounter.  [place], [facility] 11/30/2018 2:52 PM

## 2018-12-11 ENCOUNTER — Encounter (HOSPITAL_COMMUNITY): Payer: Self-pay | Admitting: *Deleted

## 2018-12-11 ENCOUNTER — Inpatient Hospital Stay (HOSPITAL_COMMUNITY)
Admission: AD | Admit: 2018-12-11 | Discharge: 2018-12-11 | Disposition: A | Discharge status: Home / Self Care | Payer: Medicaid Other | Attending: Obstetrics & Gynecology | Admitting: Obstetrics & Gynecology

## 2018-12-11 ENCOUNTER — Other Ambulatory Visit: Payer: Self-pay

## 2018-12-11 DIAGNOSIS — Z3A24 24 weeks gestation of pregnancy: Secondary | ICD-10-CM | POA: Diagnosis not present

## 2018-12-11 DIAGNOSIS — Z8571 Personal history of Hodgkin lymphoma: Secondary | ICD-10-CM | POA: Insufficient documentation

## 2018-12-11 DIAGNOSIS — O479 False labor, unspecified: Secondary | ICD-10-CM | POA: Diagnosis not present

## 2018-12-11 DIAGNOSIS — Z3689 Encounter for other specified antenatal screening: Secondary | ICD-10-CM | POA: Diagnosis not present

## 2018-12-11 LAB — URINALYSIS, ROUTINE W REFLEX MICROSCOPIC
Bacteria, UA: NONE SEEN
Bilirubin Urine: NEGATIVE
Glucose, UA: NEGATIVE mg/dL
Hgb urine dipstick: NEGATIVE
Ketones, ur: 5 mg/dL — AB
Nitrite: NEGATIVE
Protein, ur: NEGATIVE mg/dL
Specific Gravity, Urine: 1.024 (ref 1.005–1.030)
pH: 6 (ref 5.0–8.0)

## 2018-12-11 LAB — WET PREP, GENITAL
Clue Cells Wet Prep HPF POC: NONE SEEN
Sperm: NONE SEEN
Trich, Wet Prep: NONE SEEN
Yeast Wet Prep HPF POC: NONE SEEN

## 2018-12-11 MED ORDER — LACTATED RINGERS IV BOLUS
1000.0000 mL | Freq: Once | INTRAVENOUS | Status: AC
  Administered 2018-12-11: 1000 mL via INTRAVENOUS

## 2018-12-11 MED ORDER — CYCLOBENZAPRINE HCL 10 MG PO TABS
10.0000 mg | ORAL_TABLET | Freq: Once | ORAL | Status: AC
  Administered 2018-12-11: 10 mg via ORAL
  Filled 2018-12-11: qty 1

## 2018-12-11 MED ORDER — ACETAMINOPHEN 500 MG PO TABS
1000.0000 mg | ORAL_TABLET | Freq: Once | ORAL | Status: AC
  Administered 2018-12-11: 1000 mg via ORAL
  Filled 2018-12-11: qty 2

## 2018-12-11 MED ORDER — CYCLOBENZAPRINE HCL 10 MG PO TABS: 10.0000 mg | ORAL_TABLET | Freq: Two times a day (BID) | ORAL | 0 refills | Status: DC | PRN

## 2018-12-11 NOTE — MAU Note (Signed)
I have communicated with Michele Alexander, CNM and reviewed vital signs:  Vitals:   12/11/18 1928 12/11/18 2010  BP:  (!) 104/59  Pulse:  (!) 104  Resp:  18  Temp:  98.3 F (36.8 C)  SpO2: 99% 100%    Vaginal exam:  Dilation: Closed Effacement (%): Thick Cervical Position: Posterior Station: Ballotable Exam by:: Weinhold, CNM,   Also reviewed contraction pattern and that non-stress test is reactive.  It has been documented that patient is not contracting, not indicating active labor.  Patient denies any other complaints.  Based on this report provider has given order for discharge.  A discharge order and diagnosis entered by a provider.   Labor discharge instructions reviewed with patient.

## 2018-12-11 NOTE — MAU Provider Note (Signed)
History     CSN: [id number]  Arrival date and time: 12/11/18 1813   First Provider Initiated Contact with Patient 12/11/18 1845     Chief Complaint  Patient presents with  . Contractions  . vaginal pressure   HPI Michele Alexander is a 23 y.o. [phone] at [redacted]w[redacted]d who presents to MAU with chief complaint of contractions.  She denies vaginal bleeding, leaking of fluid, decreased fetal movement, fever, falls, or recent illness.   Contractions  This is a recurring problem for which patient was evaluated in MAU 11/27/18. Her current episode started last night. She endorses bilateral lower abdominal contractions which she rates as 8/10. Her pain does not radiate. She denies aggravating or alleviating factors. She has not taken medication or tried other treatments for this complaint.  Abnormal Vaginal Discharge This is a recurring problem. Patient is s/p treatment for Bacterial Vaginosis. She states she finished the medication but has not seen an improvement in her heavy vaginal discharge. She states during her previous pregnancy she seemed to "almost constantly" have either BV or a yeast infection.  OB History    Gravida  7   Para  1   Term  1   Preterm  0   AB  5   Living  1     SAB  5   TAB  0   Ectopic  0   Multiple  0   Live Births  1           Past Medical History:  Diagnosis Date  . Anemia   . Anxiety   . Blood transfusion without reported diagnosis   . Cancer ([place])    Hodgkin's Lymphoma  . Depression   . Hodgkin's disease(201)   . Sickle cell trait ([place])    traits    Past Surgical History:  Procedure Laterality Date  . DILATION AND EVACUATION N/A 12/21/2015   Procedure: DILATATION AND EVACUATION;  Surgeon: Will Bonnet, MD;  Location: [facility] ORS;  Service: Gynecology;  Laterality: N/A;  . NECK LESION BIOPSY    . OTHER SURGICAL HISTORY N/A 2015   Endoscopy  . PORT-A-CATH REMOVAL    . PORTACATH PLACEMENT Left 2010    Family History  Problem  Relation Age of Onset  . Hypertension Mother   . Diabetes Maternal Grandfather   . Hypertension Maternal Grandfather   . Cancer Maternal Grandfather   . Cancer Maternal Grandmother   . Asthma Sister   . Asthma Daughter     Social History   Tobacco Use  . Smoking status: Never Smoker  . Smokeless tobacco: Never Used  Substance Use Topics  . Alcohol use: No  . Drug use: No    Allergies:  Allergies  Allergen Reactions  . Other Hives    Oral CT contrast Oral CT contrast  . Diatrizoate Hives    PO only.  Tolerates IV contrast.  . Iodinated Diagnostic Agents Hives  . Nexium [Esomeprazole Magnesium] Swelling  . Cheese Rash and Swelling    Lip swelling    Medications Prior to Admission  Medication Sig Dispense Refill Last Dose  . Doxylamine-Pyridoxine (DICLEGIS) 10-10 MG TBEC 2 tabs q hs, if sx persist add 1 tab q am on day 3, if sx persist add 1 tab q afternoon on day 4 100 tablet 6   . metroNIDAZOLE (FLAGYL) 500 MG tablet Take 1 tablet (500 mg total) by mouth 2 (two) times daily. 13 tablet 0   . Prenatal Vit-Fe Fumarate-FA (PRENATAL  VITAMIN PO) Take by mouth.     . prochlorperazine (COMPAZINE) 10 MG tablet Take 10 mg by mouth 3 (three) times daily as needed.       Review of Systems  Constitutional: Positive for fatigue. Negative for chills and fever.  Respiratory: Negative for shortness of breath.   Gastrointestinal: Positive for abdominal pain.  Genitourinary: Positive for vaginal discharge. Negative for difficulty urinating, dysuria, flank pain, vaginal bleeding and vaginal pain.  Musculoskeletal: Negative for back pain.  Neurological: Negative for headaches.  All other systems reviewed and are negative.  Physical Exam   Blood pressure 114/70, pulse (!) 117, temperature 98.1 F (36.7 C), temperature source Oral, resp. rate 16, weight 44.5 kg, last menstrual period 06/24/2018, SpO2 100 %, unknown if currently breastfeeding.  Physical Exam  Nursing note and vitals  reviewed. Constitutional: She is oriented to person, place, and time. She appears well-developed and well-nourished.  Cardiovascular: Normal rate.  Respiratory: Effort normal.  GI: She exhibits no distension. There is no abdominal tenderness. There is no rebound and no guarding.  Gravid  Genitourinary:    Vaginal discharge present.     Genitourinary Comments: Thin white vaginal discharge noted on swab collection.   Neurological: She is alert and oriented to person, place, and time.  Skin: Skin is warm and dry.  Psychiatric: She has a normal mood and affect. Her behavior is normal. Judgment and thought content normal.    MAU Course/MDM  Procedures  --Swabs collected via blind swab --Cervix is closed/thick/posterior --Reactive tracing: baseline 140, moderate variability, positive accels, no decels --Toco: UI, rare contractions resolved with pain medications and fluid bolus --Small leukocytes, elevated WBC on UA. Squamous presents, possibly contaminated catch  Patient Vitals for the past 24 hrs:  BP Temp Temp src Pulse Resp SpO2 Weight  12/11/18 1928 - - - - - 99 % -  12/11/18 1925 - - - - - 99 % -  12/11/18 1829 114/70 98.1 F (36.7 C) Oral (!) 117 16 100 % 44.5 kg    Results for orders placed or performed during the hospital encounter of 12/11/18 (from the past 24 hour(s))  Urinalysis, Routine w reflex microscopic     Status: Abnormal   Collection Time: 12/11/18  6:39 PM  Result Value Ref Range   Color, Urine YELLOW YELLOW   APPearance CLEAR CLEAR   Specific Gravity, Urine 1.024 1.005 - 1.030   pH 6.0 5.0 - 8.0   Glucose, UA NEGATIVE NEGATIVE mg/dL   Hgb urine dipstick NEGATIVE NEGATIVE   Bilirubin Urine NEGATIVE NEGATIVE   Ketones, ur 5 (A) NEGATIVE mg/dL   Protein, ur NEGATIVE NEGATIVE mg/dL   Nitrite NEGATIVE NEGATIVE   Leukocytes,Ua SMALL (A) NEGATIVE   RBC / HPF 0-5 0 - 5 RBC/hpf   WBC, UA 11-20 0 - 5 WBC/hpf   Bacteria, UA NONE SEEN NONE SEEN   Squamous  Epithelial / LPF 11-20 0 - 5   Mucus PRESENT   Wet prep, genital     Status: Abnormal   Collection Time: 12/11/18  6:50 PM  Result Value Ref Range   Yeast Wet Prep HPF POC NONE SEEN NONE SEEN   Trich, Wet Prep NONE SEEN NONE SEEN   Clue Cells Wet Prep HPF POC NONE SEEN NONE SEEN   WBC, Wet Prep HPF POC MANY (A) NONE SEEN   Sperm NONE SEEN    Meds ordered this encounter  Medications  . cyclobenzaprine (FLEXERIL) tablet 10 mg  . acetaminophen (TYLENOL) tablet  1,000 mg  . lactated ringers bolus 1,000 mL  . cyclobenzaprine (FLEXERIL) 10 MG tablet    Sig: Take 1 tablet (10 mg total) by mouth 2 (two) times daily as needed for muscle spasms.    Dispense:  20 tablet    Refill:  0    Order Specific Question:   Supervising Provider    Answer:   Verita Schneiders A [[id number]]   Assessment and Plan  --23 y.o. [id number] at [redacted]w[redacted]d  --Reactive tracing, closed cervix --Pain reduced from 8 to 3/10 with Flexeril, home rx to pharmacy --Urine culture in work --Discharge home in stable condition  F/U: Next OB appt [facility] 12/28/18  Darlina Rumpf, [place] 12/11/2018, 8:26 PM

## 2018-12-11 NOTE — Discharge Instructions (Signed)
Braxton Hicks Contractions °Contractions of the uterus can occur throughout pregnancy, but they are not always a sign that you are in labor. You may have practice contractions called Braxton Hicks contractions. These false labor contractions are sometimes confused with true labor. °What are Braxton Hicks contractions? °Braxton Hicks contractions are tightening movements that occur in the muscles of the uterus before labor. Unlike true labor contractions, these contractions do not result in opening (dilation) and thinning of the cervix. Toward the end of pregnancy (32-34 weeks), Braxton Hicks contractions can happen more often and may become stronger. These contractions are sometimes difficult to tell apart from true labor because they can be very uncomfortable. You should not feel embarrassed if you go to the hospital with false labor. °Sometimes, the only way to tell if you are in true labor is for your health care provider to look for changes in the cervix. The health care provider will do a physical exam and may monitor your contractions. If you are not in true labor, the exam should show that your cervix is not dilating and your water has not broken. °If there are no other health problems associated with your pregnancy, it is completely safe for you to be sent home with false labor. You may continue to have Braxton Hicks contractions until you go into true labor. °How to tell the difference between true labor and false labor °True labor °· Contractions last 30-70 seconds. °· Contractions become very regular. °· Discomfort is usually felt in the top of the uterus, and it spreads to the lower abdomen and low back. °· Contractions do not go away with walking. °· Contractions usually become more intense and increase in frequency. °· The cervix dilates and gets thinner. °False labor °· Contractions are usually shorter and not as strong as true labor contractions. °· Contractions are usually irregular. °· Contractions  are often felt in the front of the lower abdomen and in the groin. °· Contractions may go away when you walk around or change positions while lying down. °· Contractions get weaker and are shorter-lasting as time goes on. °· The cervix usually does not dilate or become thin. °Follow these instructions at home: ° °· Take over-the-counter and prescription medicines only as told by your health care provider. °· Keep up with your usual exercises and follow other instructions from your health care provider. °· Eat and drink lightly if you think you are going into labor. °· If Braxton Hicks contractions are making you uncomfortable: °? Change your position from lying down or resting to walking, or change from walking to resting. °? Sit and rest in a tub of warm water. °? Drink enough fluid to keep your urine pale yellow. Dehydration may cause these contractions. °? Do slow and deep breathing several times an hour. °· Keep all follow-up prenatal visits as told by your health care provider. This is important. °Contact a health care provider if: °· You have a fever. °· You have continuous pain in your abdomen. °Get help right away if: °· Your contractions become stronger, more regular, and closer together. °· You have fluid leaking or gushing from your vagina. °· You pass blood-tinged mucus (bloody show). °· You have bleeding from your vagina. °· You have low back pain that you never had before. °· You feel your baby’s head pushing down and causing pelvic pressure. °· Your baby is not moving inside you as much as it used to. °Summary °· Contractions that occur before labor are   called Braxton Hicks contractions, false labor, or practice contractions.  Braxton Hicks contractions are usually shorter, weaker, farther apart, and less regular than true labor contractions. True labor contractions usually become progressively stronger and regular, and they become more frequent.  Manage discomfort from [facility] contractions  by changing position, resting in a warm bath, drinking plenty of water, or practicing deep breathing. This information is not intended to replace advice given to you by your health care provider. Make sure you discuss any questions you have with your health care provider. Document Released: 10/09/2016 Document Revised: 05/08/2017 Document Reviewed: 10/09/2016 Elsevier Patient Education  2020 [employer].

## 2018-12-11 NOTE — MAU Note (Signed)
Michele Alexander is a 23 y.o. at [redacted]w[redacted]d here in MAU reporting:  +contractions Onset of complaint: started last night Patient reports concern that she may still have an infection. States last time she was here she had BV and completed her medication. However feels like "something may still be going on". Reports her urine is dark. No color to vaginal discharge just an increase of it. Pain score: 8/10 Has not taken anything for the pain. +vaginal pressure and vaginal swelling that started today as well Vitals:   12/11/18 1829  BP: 114/70  Pulse: (!) 117  Resp: 16  Temp: 98.1 F (36.7 C)  SpO2: 100%    FHT: +FM Lab orders placed from triage: ua

## 2018-12-12 LAB — CULTURE, OB URINE: Culture: NO GROWTH

## 2018-12-28 ENCOUNTER — Other Ambulatory Visit: Payer: Medicaid Other

## 2018-12-28 ENCOUNTER — Other Ambulatory Visit: Payer: Self-pay

## 2018-12-28 ENCOUNTER — Ambulatory Visit (INDEPENDENT_AMBULATORY_CARE_PROVIDER_SITE_OTHER): Payer: Medicaid Other | Admitting: Women's Health

## 2018-12-28 ENCOUNTER — Encounter: Payer: Self-pay | Admitting: Women's Health

## 2018-12-28 VITALS — BP 112/74 | HR 113 | Wt 102.0 lb

## 2018-12-28 DIAGNOSIS — Z3A26 26 weeks gestation of pregnancy: Secondary | ICD-10-CM

## 2018-12-28 DIAGNOSIS — Z3482 Encounter for supervision of other normal pregnancy, second trimester: Secondary | ICD-10-CM

## 2018-12-28 DIAGNOSIS — Z23 Encounter for immunization: Secondary | ICD-10-CM | POA: Diagnosis not present

## 2018-12-28 DIAGNOSIS — Z331 Pregnant state, incidental: Secondary | ICD-10-CM

## 2018-12-28 DIAGNOSIS — Z1389 Encounter for screening for other disorder: Secondary | ICD-10-CM

## 2018-12-28 LAB — POCT URINALYSIS DIPSTICK OB
Blood, UA: NEGATIVE
Glucose, UA: NEGATIVE
Ketones, UA: NEGATIVE
Leukocytes, UA: NEGATIVE
Nitrite, UA: NEGATIVE
POC,PROTEIN,UA: NEGATIVE

## 2018-12-28 NOTE — Progress Notes (Signed)
   LOW-RISK PREGNANCY VISIT Patient name: Michele Alexander MRN [id number]  Date of birth: [birth year] Chief Complaint:   Routine Prenatal Visit (PN2; contractions off and on; + pressure and swelling)  History of Present Illness:   Michele Alexander is a 23 y.o. [phone] female at [redacted]w[redacted]d with an Estimated Date of Delivery: 03/31/19 being seen today for ongoing management of a low-risk pregnancy.  Today she reports irregular contractions. Contractions: Irregular. Vag. Bleeding: None.  Movement: Present. denies leaking of fluid. Review of Systems:   Pertinent items are noted in HPI Denies abnormal vaginal discharge w/ itching/odor/irritation, headaches, visual changes, shortness of breath, chest pain, abdominal pain, severe nausea/vomiting, or problems with urination or bowel movements unless otherwise stated above. Pertinent History Reviewed:  Reviewed past medical,surgical, social, obstetrical and family history.  Reviewed problem list, medications and allergies. Physical Assessment:   Vitals:   12/28/18 0944  BP: 112/74  Pulse: (!) 113  Weight: 102 lb (46.3 kg)  Body mass index is 20.6 kg/m.        Physical Examination:   General appearance: Well appearing, and in no distress  Mental status: Alert, oriented to person, place, and time  Skin: Warm & dry  Cardiovascular: Normal heart rate noted  Respiratory: Normal respiratory effort, no distress  Abdomen: Soft, gravid, nontender  Pelvic: Cervical exam deferred         Extremities: Edema: None  Fetal Status: Fetal Heart Rate (bpm): 140 Fundal Height: 25 cm Movement: Present    Results for orders placed or performed in visit on 12/28/18 (from the past 24 hour(s))  POC Urinalysis Dipstick OB   Collection Time: 12/28/18  9:44 AM  Result Value Ref Range   Color, UA     Clarity, UA     Glucose, UA Negative Negative   Bilirubin, UA     Ketones, UA neg    Spec Grav, UA     Blood, UA neg    pH, UA     POC,PROTEIN,UA Negative Negative,  Trace, Small (1+), Moderate (2+), Large (3+), 4+   Urobilinogen, UA     Nitrite, UA neg    Leukocytes, UA Negative Negative   Appearance     Odor      Assessment & Plan:  1) Low-risk pregnancy [id number] at [redacted]w[redacted]d with an Estimated Date of Delivery: 03/31/19   2) Irregular uc's, increase po water intake, reviewed ptl s/s, reasons to seek care   Meds: No orders of the defined types were placed in this encounter.  Labs/procedures today: pn2, tdap  Plan:  Continue routine obstetrical care   Reviewed: Preterm labor symptoms and general obstetric precautions including but not limited to vaginal bleeding, contractions, leaking of fluid and fetal movement were reviewed in detail with the patient.  All questions were answered. Has home bp cuff. Check bp weekly, let us know if >140/90.   Follow-up: Return in about 4 weeks (around 01/25/2019) for [place], [employer].  Orders Placed This Encounter  Procedures  . POC Urinalysis Dipstick OB   Roma Schanz CNM, [facility] 12/28/2018 9:56 AM

## 2018-12-28 NOTE — Patient Instructions (Addendum)
Michele Alexander, I greatly value your feedback.  If you receive a survey following your visit with [place] today, we appreciate you taking the time to fill it out.  Thanks, Knute Neu, CNM, [facility]  [place]!!! It is now [place] at [facility] ([place], [place] [address]) Entrance located off of [address] parking    Go to [email or web address] to register for FREE online childbirth classes   Call the office [phone]) or go to [facility] if:  You begin to have strong, frequent contractions  Your water breaks.  Sometimes it is a big gush of fluid, sometimes it is just a trickle that keeps getting your panties wet or running down your legs  You have vaginal bleeding.  It is normal to have a small amount of spotting if your cervix was checked.   You don't feel your baby moving like normal.  If you don't, get you something to eat and drink and lay down and focus on feeling your baby move.  You should feel at least 10 movements in 2 hours.  If you don't, you should call the office or go to [facility].    Tdap Vaccine  It is recommended that you get the Tdap vaccine during the third trimester of EACH pregnancy to help protect your baby from getting pertussis (whooping cough)  27-36 weeks is the BEST time to do this so that you can pass the protection on to your baby. During pregnancy is better than after pregnancy, but if you are unable to get it during pregnancy it will be offered at the hospital.   You can get this vaccine with [place], at the health department, your family doctor, or some local pharmacies  Everyone who will be around your baby should also be up-to-date on their vaccines before the baby comes. Adults (who are not pregnant) only need 1 dose of Tdap during adulthood.   [place] Pediatricians/Family Doctors:  [facility] [phone]                  [place] [phone] (usually not accepting new patients unless you have family there already, you are always welcome to call and ask)       [facility] Department [phone]       [facility] Pediatricians/Family Doctors:   [facility]: [phone]  [facility]: [phone]  [facility]: [place] Doctors:   [facility]: [facility]: [place]:  [place]: [phone]   Home Blood Pressure Monitoring for Patients   Your provider has recommended that you check your blood pressure (BP) at least once a week at home. If you do not have a blood pressure cuff at home, one will be provided for you. Contact your provider if you have not received your monitor within 1 week.   Helpful Tips for Accurate Home Blood Pressure Checks  . Don't smoke, exercise, or drink caffeine 30 minutes before checking your BP . Use the restroom before checking your BP (a full bladder can raise your pressure) . Relax in a comfortable upright chair . Feet on the ground . Left arm resting comfortably on a flat surface at the level of your heart . Legs uncrossed . Back supported . Sit quietly and don't talk .  Place the cuff on your bare arm . Adjust snuggly, so that only two fingertips can fit between your skin and the top of the cuff . Check 2 readings separated by at least one minute . Keep a log of your BP readings . For a visual, please reference this diagram: http://ccnc.care/bpdiagram  Provider Name: [facility]     Phone: [phone]  Zone 1: ALL CLEAR  Continue to monitor your symptoms:  . BP reading is less than 140 (top number) or less than 90 (bottom number)  . No right upper stomach pain . No headaches or seeing spots . No feeling nauseated or throwing up . No swelling in face and  hands  Zone 2: CAUTION Call your doctor's office for any of the following:  . BP reading is greater than 140 (top number) or greater than 90 (bottom number)  . Stomach pain under your ribs in the middle or right side . Headaches or seeing spots . Feeling nauseated or throwing up . Swelling in face and hands  Zone 3: EMERGENCY  Seek immediate medical care if you have any of the following:  . BP reading is greater than160 (top number) or greater than 110 (bottom number) . Severe headaches not improving with Tylenol . Serious difficulty catching your breath . Any worsening symptoms from Zone 2   Third Trimester of Pregnancy The third trimester is from week 29 through week 42, months 7 through 9. The third trimester is a time when the fetus is growing rapidly. At the end of the ninth month, the fetus is about 20 inches in length and weighs 6-10 pounds.  BODY CHANGES Your body goes through many changes during pregnancy. The changes vary from woman to woman.   Your weight will continue to increase. You can expect to gain 25-35 pounds (11-16 kg) by the end of the pregnancy.  You may begin to get stretch marks on your hips, abdomen, and breasts.  You may urinate more often because the fetus is moving lower into your pelvis and pressing on your bladder.  You may develop or continue to have heartburn as a result of your pregnancy.  You may develop constipation because certain hormones are causing the muscles that push waste through your intestines to slow down.  You may develop hemorrhoids or swollen, bulging veins (varicose veins).  You may have pelvic pain because of the weight gain and pregnancy hormones relaxing your joints between the bones in your pelvis. Backaches may result from overexertion of the muscles supporting your posture.  You may have changes in your hair. These can include thickening of your hair, rapid growth, and changes in texture. Some women also have hair loss during  or after pregnancy, or hair that feels dry or thin. Your hair will most likely return to normal after your baby is born.  Your breasts will continue to grow and be tender. A yellow discharge may leak from your breasts called colostrum.  Your belly button may stick out.  You may feel short of breath because of your expanding uterus.  You may notice the fetus "dropping," or moving lower in your abdomen.  You may have a bloody mucus discharge. This usually occurs a few days to a week before labor begins.  Your cervix becomes thin and soft (effaced) near your due date. WHAT TO EXPECT AT YOUR PRENATAL EXAMS  You will have prenatal exams every 2 weeks until week 36. Then, you will have weekly prenatal exams. During  a routine prenatal visit:  You will be weighed to make sure you and the fetus are growing normally.  Your blood pressure is taken.  Your abdomen will be measured to track your baby's growth.  The fetal heartbeat will be listened to.  Any test results from the previous visit will be discussed.  You may have a cervical check near your due date to see if you have effaced. At around 36 weeks, your caregiver will check your cervix. At the same time, your caregiver will also perform a test on the secretions of the vaginal tissue. This test is to determine if a type of bacteria, Group B streptococcus, is present. Your caregiver will explain this further. Your caregiver may ask you:  What your birth plan is.  How you are feeling.  If you are feeling the baby move.  If you have had any abnormal symptoms, such as leaking fluid, bleeding, severe headaches, or abdominal cramping.  If you have any questions. Other tests or screenings that may be performed during your third trimester include:  Blood tests that check for low iron levels (anemia).  Fetal testing to check the health, activity level, and growth of the fetus. Testing is done if you have certain medical conditions or if  there are problems during the pregnancy. FALSE LABOR You may feel small, irregular contractions that eventually go away. These are called Braxton Hicks contractions, or false labor. Contractions may last for hours, days, or even weeks before true labor sets in. If contractions come at regular intervals, intensify, or become painful, it is best to be seen by your caregiver.  SIGNS OF LABOR   Menstrual-like cramps.  Contractions that are 5 minutes apart or less.  Contractions that start on the top of the uterus and spread down to the lower abdomen and back.  A sense of increased pelvic pressure or back pain.  A watery or bloody mucus discharge that comes from the vagina. If you have any of these signs before the 37th week of pregnancy, call your caregiver right away. You need to go to the hospital to get checked immediately. HOME CARE INSTRUCTIONS   Avoid all smoking, herbs, alcohol, and unprescribed drugs. These chemicals affect the formation and growth of the baby.  Follow your caregiver's instructions regarding medicine use. There are medicines that are either safe or unsafe to take during pregnancy.  Exercise only as directed by your caregiver. Experiencing uterine cramps is a good sign to stop exercising.  Continue to eat regular, healthy meals.  Wear a good support bra for breast tenderness.  Do not use hot tubs, steam rooms, or saunas.  Wear your seat belt at all times when driving.  Avoid raw meat, uncooked cheese, cat litter boxes, and soil used by cats. These carry germs that can cause birth defects in the baby.  Take your prenatal vitamins.  Try taking a stool softener (if your caregiver approves) if you develop constipation. Eat more high-fiber foods, such as fresh vegetables or fruit and whole grains. Drink plenty of fluids to keep your urine clear or pale yellow.  Take warm sitz baths to soothe any pain or discomfort caused by hemorrhoids. Use hemorrhoid cream if your  caregiver approves.  If you develop varicose veins, wear support hose. Elevate your feet for 15 minutes, 3-4 times a day. Limit salt in your diet.  Avoid heavy lifting, wear low heal shoes, and practice good posture.  Rest a lot with your legs elevated if you  have leg cramps or low back pain.  Visit your dentist if you have not gone during your pregnancy. Use a soft toothbrush to brush your teeth and be gentle when you floss.  A sexual relationship may be continued unless your caregiver directs you otherwise.  Do not travel far distances unless it is absolutely necessary and only with the approval of your caregiver.  Take prenatal classes to understand, practice, and ask questions about the labor and delivery.  Make a trial run to the hospital.  Pack your hospital bag.  Prepare the baby's nursery.  Continue to go to all your prenatal visits as directed by your caregiver. SEEK MEDICAL CARE IF:  You are unsure if you are in labor or if your water has broken.  You have dizziness.  You have mild pelvic cramps, pelvic pressure, or nagging pain in your abdominal area.  You have persistent nausea, vomiting, or diarrhea.  You have a bad smelling vaginal discharge.  You have pain with urination. SEEK IMMEDIATE MEDICAL CARE IF:   You have a fever.  You are leaking fluid from your vagina.  You have spotting or bleeding from your vagina.  You have severe abdominal cramping or pain.  You have rapid weight loss or gain.  You have shortness of breath with chest pain.  You notice sudden or extreme swelling of your face, hands, ankles, feet, or legs.  You have not felt your baby move in over an hour.  You have severe headaches that do not go away with medicine.  You have vision changes. Document Released: 05/20/2001 Document Revised: 05/31/2013 Document Reviewed: 07/27/2012 [facility] Patient Information 2015 [place], [place]. This information is not intended to replace advice  given to you by your health care provider. Make sure you discuss any questions you have with your health care provider.   Preterm Labor and Birth Information  The normal length of a pregnancy is 39-41 weeks. Preterm labor is when labor starts before 37 completed weeks of pregnancy. What are the risk factors for preterm labor? Preterm labor is more likely to occur in women who:  Have certain infections during pregnancy such as a bladder infection, sexually transmitted infection, or infection inside the uterus (chorioamnionitis).  Have a shorter-than-normal cervix.  Have gone into preterm labor before.  Have had surgery on their cervix.  Are younger than age 37 or older than age 78.  Are African American.  Are pregnant with twins or multiple babies (multiple gestation).  Take street drugs or smoke while pregnant.  Do not gain enough weight while pregnant.  Became pregnant shortly after having been pregnant. What are the symptoms of preterm labor? Symptoms of preterm labor include:  Cramps similar to those that can happen during a menstrual period. The cramps may happen with diarrhea.  Pain in the abdomen or lower back.  Regular uterine contractions that may feel like tightening of the abdomen.  A feeling of increased pressure in the pelvis.  Increased watery or bloody mucus discharge from the vagina.  Water breaking (ruptured amniotic sac). Why is it important to recognize signs of preterm labor? It is important to recognize signs of preterm labor because babies who are born prematurely may not be fully developed. This can put them at an increased risk for:  Long-term (chronic) heart and lung problems.  Difficulty immediately after birth with regulating body systems, including blood sugar, body temperature, heart rate, and breathing rate.  Bleeding in the brain.  Cerebral palsy.  Learning difficulties.  Death. These risks are highest for babies who are born  before 57 weeks of pregnancy. How is preterm labor treated? Treatment depends on the length of your pregnancy, your condition, and the health of your baby. It may involve:  Having a stitch (suture) placed in your cervix to prevent your cervix from opening too early (cerclage).  Taking or being given medicines, such as: ? Hormone medicines. These may be given early in pregnancy to help support the pregnancy. ? Medicine to stop contractions. ? Medicines to help mature the baby's lungs. These may be prescribed if the risk of delivery is high. ? Medicines to prevent your baby from developing cerebral palsy. If the labor happens before 34 weeks of pregnancy, you may need to stay in the hospital. What should I do if I think I am in preterm labor? If you think that you are going into preterm labor, call your health care provider right away. How can I prevent preterm labor in future pregnancies? To increase your chance of having a full-term pregnancy:  Do not use any tobacco products, such as cigarettes, chewing tobacco, and e-cigarettes. If you need help quitting, ask your health care provider.  Do not use street drugs or medicines that have not been prescribed to you during your pregnancy.  Talk with your health care provider before taking any herbal supplements, even if you have been taking them regularly.  Make sure you gain a healthy amount of weight during your pregnancy.  Watch for infection. If you think that you might have an infection, get it checked right away.  Make sure to tell your health care provider if you have gone into preterm labor before. This information is not intended to replace advice given to you by your health care provider. Make sure you discuss any questions you have with your health care provider. Document Released: 08/16/2003 Document Revised: 09/17/2018 Document Reviewed: 10/17/2015 Elsevier Patient Education  2020 [employer].

## 2018-12-29 ENCOUNTER — Other Ambulatory Visit: Payer: Self-pay | Admitting: Women's Health

## 2018-12-29 LAB — HIV ANTIBODY (ROUTINE TESTING W REFLEX): HIV Screen 4th Generation wRfx: NONREACTIVE

## 2018-12-29 LAB — CBC
Hematocrit: 30.2 % — ABNORMAL LOW (ref 34.0–46.6)
Hemoglobin: 9.5 g/dL — ABNORMAL LOW (ref 11.1–15.9)
MCH: 22.7 pg — ABNORMAL LOW (ref 26.6–33.0)
MCHC: 31.5 g/dL (ref 31.5–35.7)
MCV: 72 fL — ABNORMAL LOW (ref 79–97)
Platelets: 332 10*3/uL (ref 150–450)
RBC: 4.19 x10E6/uL (ref 3.77–5.28)
RDW: 14.7 % (ref 11.7–15.4)
WBC: 9.6 10*3/uL (ref 3.4–10.8)

## 2018-12-29 LAB — GLUCOSE TOLERANCE, 2 HOURS W/ 1HR
Glucose, 1 hour: 90 mg/dL (ref 65–179)
Glucose, 2 hour: 97 mg/dL (ref 65–152)
Glucose, Fasting: 77 mg/dL (ref 65–91)

## 2018-12-29 LAB — RPR: RPR Ser Ql: NONREACTIVE

## 2018-12-29 LAB — ANTIBODY SCREEN: Antibody Screen: NEGATIVE

## 2018-12-29 MED ORDER — FERROUS SULFATE 325 (65 FE) MG PO TABS: 325.0000 mg | ORAL_TABLET | Freq: Two times a day (BID) | ORAL | 3 refills | Status: DC

## 2019-01-10 ENCOUNTER — Encounter: Payer: Self-pay | Admitting: Obstetrics and Gynecology

## 2019-01-10 ENCOUNTER — Other Ambulatory Visit: Payer: Self-pay

## 2019-01-10 ENCOUNTER — Ambulatory Visit (INDEPENDENT_AMBULATORY_CARE_PROVIDER_SITE_OTHER): Payer: Medicaid Other | Admitting: Obstetrics and Gynecology

## 2019-01-10 VITALS — BP 103/70 | HR 121 | Wt 102.0 lb

## 2019-01-10 DIAGNOSIS — Z1389 Encounter for screening for other disorder: Secondary | ICD-10-CM

## 2019-01-10 DIAGNOSIS — Z3A28 28 weeks gestation of pregnancy: Secondary | ICD-10-CM

## 2019-01-10 DIAGNOSIS — O26893 Other specified pregnancy related conditions, third trimester: Secondary | ICD-10-CM

## 2019-01-10 DIAGNOSIS — Z3482 Encounter for supervision of other normal pregnancy, second trimester: Secondary | ICD-10-CM

## 2019-01-10 DIAGNOSIS — O2340 Unspecified infection of urinary tract in pregnancy, unspecified trimester: Secondary | ICD-10-CM | POA: Insufficient documentation

## 2019-01-10 DIAGNOSIS — O2343 Unspecified infection of urinary tract in pregnancy, third trimester: Secondary | ICD-10-CM

## 2019-01-10 DIAGNOSIS — N898 Other specified noninflammatory disorders of vagina: Secondary | ICD-10-CM

## 2019-01-10 DIAGNOSIS — Z331 Pregnant state, incidental: Secondary | ICD-10-CM

## 2019-01-10 LAB — POCT URINALYSIS DIPSTICK OB
Blood, UA: NEGATIVE
Glucose, UA: NEGATIVE
Ketones, UA: NEGATIVE
Leukocytes, UA: NEGATIVE
Nitrite, UA: NEGATIVE

## 2019-01-10 MED ORDER — PHENAZOPYRIDINE HCL 200 MG PO TABS: 200.0000 mg | ORAL_TABLET | Freq: Three times a day (TID) | ORAL | 0 refills | Status: DC

## 2019-01-10 MED ORDER — NITROFURANTOIN MONOHYD MACRO 100 MG PO CAPS: 100.0000 mg | ORAL_CAPSULE | Freq: Two times a day (BID) | ORAL | 0 refills | Status: DC

## 2019-01-10 NOTE — Addendum Note (Signed)
Addended by: Diona Fanti A on: 01/10/2019 04:39 PM   Modules accepted: Orders

## 2019-01-10 NOTE — Progress Notes (Signed)
Subjective:  Michele Alexander is a 23 y.o. [phone] at [redacted]w[redacted]d being seen today for ongoing prenatal care.  She is currently monitored for the following issues for this high-risk pregnancy and has History of Hodgkin's lymphoma; Sickle cell trait ([place]); Supervision of normal pregnancy; UTI (urinary tract infection) during pregnancy; and Vaginal discharge during pregnancy on their problem list.  Patient reports left sided pain and vaginal discharge for the last 3 days. No VB or LOF. No recent IC. Some increased urinary freq.  Contractions: Irregular.  .  Movement: Present. Denies leaking of fluid.   The following portions of the patient's history were reviewed and updated as appropriate: allergies, current medications, past family history, past medical history, past social history, past surgical history and problem list. Problem list updated.  Objective:   Vitals:   01/10/19 1607  BP: 103/70  Pulse: (!) 121  Weight: 102 lb (46.3 kg)    Fetal Status:     Movement: Present     General:  Alert, oriented and cooperative. Patient is in no acute distress.  Skin: Skin is warm and dry. No rash noted.   Cardiovascular: Normal heart rate noted  Respiratory: Normal respiratory effort, no problems with respiration noted  Abdomen: Soft, gravid, appropriate for gestational age. Pain/Pressure: Present     Pelvic:  Cervical exam performed        Extremities: Normal range of motion.  Edema: None  Mental Status: Normal mood and affect. Normal behavior. Normal judgment and thought content.   Urinalysis:      Assessment and Plan:  Pregnancy: [id number] at [redacted]w[redacted]d  1. Encounter for supervision of other normal pregnancy in second trimester Stable No evidence of PTL  2. Pregnant state, incidental  - POC Urinalysis Dipstick OB  3. Screening for genitourinary condition  - POC Urinalysis Dipstick OB  4. Urinary tract infection in mother during third trimester of pregnancy + Bladder tenderness on exam -  nitrofurantoin, macrocrystal-monohydrate, (MACROBID) 100 MG capsule; Take 1 capsule (100 mg total) by mouth 2 (two) times daily.  Dispense: 14 capsule; Refill: 0 - phenazopyridine (PYRIDIUM) 200 MG tablet; Take 1 tablet (200 mg total) by mouth 3 (three) times daily with meals.  Dispense: 10 tablet; Refill: 0  5. Vaginal discharge during pregnancy in third trimester NuSwab collected  Preterm labor symptoms and general obstetric precautions including but not limited to vaginal bleeding, contractions, leaking of fluid and fetal movement were reviewed in detail with the patient. Please refer to After Visit Summary for other counseling recommendations.  Keep F/U appt scheduled for 01/25/19 or call if Sx do not improve or worsen  Chancy Milroy, MD

## 2019-01-13 LAB — NUSWAB VAGINITIS PLUS (VG+)
Candida albicans, NAA: NEGATIVE
Candida glabrata, NAA: NEGATIVE
Chlamydia trachomatis, NAA: NEGATIVE
Neisseria gonorrhoeae, NAA: NEGATIVE
Trich vag by NAA: NEGATIVE

## 2019-01-19 ENCOUNTER — Telehealth: Payer: Self-pay | Admitting: *Deleted

## 2019-01-19 NOTE — Telephone Encounter (Signed)
Patient states she has had decreased fetal movement the last 3 days.  Says the baby is moving but does not seem to be moving as much.  Baby moved at 7 this morning and she just felt the baby move twice since talking with her on the phone.  I advised patient to perform kick counts and if she did not get 10 kicks in 2 hours, she needed to be seen.  Pt verbalized understanding.

## 2019-01-20 ENCOUNTER — Encounter: Payer: Self-pay | Admitting: *Deleted

## 2019-01-22 ENCOUNTER — Other Ambulatory Visit: Payer: Self-pay

## 2019-01-22 ENCOUNTER — Encounter (HOSPITAL_COMMUNITY): Payer: Self-pay

## 2019-01-22 ENCOUNTER — Inpatient Hospital Stay (HOSPITAL_COMMUNITY)
Admission: AD | Admit: 2019-01-22 | Discharge: 2019-01-22 | Disposition: A | Discharge status: Home / Self Care | Payer: Medicaid Other | Attending: Obstetrics and Gynecology | Admitting: Obstetrics and Gynecology

## 2019-01-22 DIAGNOSIS — O4703 False labor before 37 completed weeks of gestation, third trimester: Secondary | ICD-10-CM | POA: Diagnosis not present

## 2019-01-22 DIAGNOSIS — O4700 False labor before 37 completed weeks of gestation, unspecified trimester: Secondary | ICD-10-CM

## 2019-01-22 DIAGNOSIS — Z3689 Encounter for other specified antenatal screening: Secondary | ICD-10-CM | POA: Diagnosis not present

## 2019-01-22 DIAGNOSIS — O479 False labor, unspecified: Secondary | ICD-10-CM

## 2019-01-22 DIAGNOSIS — Z3A30 30 weeks gestation of pregnancy: Secondary | ICD-10-CM | POA: Diagnosis not present

## 2019-01-22 LAB — URINALYSIS, ROUTINE W REFLEX MICROSCOPIC
Bacteria, UA: NONE SEEN
Bilirubin Urine: NEGATIVE
Glucose, UA: 50 mg/dL — AB
Hgb urine dipstick: NEGATIVE
Ketones, ur: 5 mg/dL — AB
Nitrite: NEGATIVE
Protein, ur: NEGATIVE mg/dL
Specific Gravity, Urine: 1.018 (ref 1.005–1.030)
pH: 6 (ref 5.0–8.0)

## 2019-01-22 LAB — FETAL FIBRONECTIN: Fetal Fibronectin: NEGATIVE

## 2019-01-22 NOTE — Discharge Instructions (Signed)
Braxton Hicks Contractions Contractions of the uterus can occur throughout pregnancy, but they are not always a sign that you are in labor. You may have practice contractions called Braxton Hicks contractions. These false labor contractions are sometimes confused with true labor. What are [place] contractions? Braxton Hicks contractions are tightening movements that occur in the muscles of the uterus before labor. Unlike true labor contractions, these contractions do not result in opening (dilation) and thinning of the cervix. Toward the end of pregnancy (32-34 weeks), Braxton Hicks contractions can happen more often and may become stronger. These contractions are sometimes difficult to tell apart from true labor because they can be very uncomfortable. You should not feel embarrassed if you go to the hospital with false labor. Sometimes, the only way to tell if you are in true labor is for your health care provider to look for changes in the cervix. The health care provider will do a physical exam and may monitor your contractions. If you are not in true labor, the exam should show that your cervix is not dilating and your water has not broken. If there are no other health problems associated with your pregnancy, it is completely safe for you to be sent home with false labor. You may continue to have Braxton Hicks contractions until you go into true labor. How to tell the difference between true labor and false labor True labor  Contractions last 30-70 seconds.  Contractions become very regular.  Discomfort is usually felt in the top of the uterus, and it spreads to the lower abdomen and low back.  Contractions do not go away with walking.  Contractions usually become more intense and increase in frequency.  The cervix dilates and gets thinner. False labor  Contractions are usually shorter and not as strong as true labor contractions.  Contractions are usually  irregular.  Contractions are often felt in the front of the lower abdomen and in the groin.  Contractions may go away when you walk around or change positions while lying down.  Contractions get weaker and are shorter-lasting as time goes on.  The cervix usually does not dilate or become thin. Follow these instructions at home:   Take over-the-counter and prescription medicines only as told by your health care provider.  Keep up with your usual exercises and follow other instructions from your health care provider.  Eat and drink lightly if you think you are going into labor.  If Braxton Hicks contractions are making you uncomfortable: ? Change your position from lying down or resting to walking, or change from walking to resting. ? Sit and rest in a tub of warm water. ? Drink enough fluid to keep your urine pale yellow. Dehydration may cause these contractions. ? Do slow and deep breathing several times an hour.  Keep all follow-up prenatal visits as told by your health care provider. This is important. Contact a health care provider if:  You have a fever.  You have continuous pain in your abdomen. Get help right away if:  Your contractions become stronger, more regular, and closer together.  You have fluid leaking or gushing from your vagina.  You pass blood-tinged mucus (bloody show).  You have bleeding from your vagina.  You have low back pain that you never had before.  You feel your babys head pushing down and causing pelvic pressure.  Your baby is not moving inside you as much as it used to. Summary  Contractions that occur before labor  are called Braxton Hicks contractions, false labor, or practice contractions.  Braxton Hicks contractions are usually shorter, weaker, farther apart, and less regular than true labor contractions. True labor contractions usually become progressively stronger and regular, and they become more frequent.  Manage discomfort from  [facility] contractions by changing position, resting in a warm bath, drinking plenty of water, or practicing deep breathing. This information is not intended to replace advice given to you by your health care provider. Make sure you discuss any questions you have with your health care provider. Document Released: 10/09/2016 Document Revised: 05/08/2017 Document Reviewed: 10/09/2016 Elsevier Patient Education  [employer].   Abdominal Pain During Pregnancy  Abdominal pain is common during pregnancy, and has many possible causes. Some causes are more serious than others, and sometimes the cause is not known. Abdominal pain can be a sign that labor is starting. It can also be caused by normal growth and stretching of muscles and ligaments during pregnancy. Always tell your health care provider if you have any abdominal pain. Follow these instructions at home:  Do not have sex or put anything in your vagina until your pain goes away completely.  Get plenty of rest until your pain improves.  Drink enough fluid to keep your urine pale yellow.  Take over-the-counter and prescription medicines only as told by your health care provider.  Keep all follow-up visits as told by your health care provider. This is important. Contact a health care provider if:  Your pain continues or gets worse after resting.  You have lower abdominal pain that: ? Comes and goes at regular intervals. ? Spreads to your back. ? Is similar to menstrual cramps.  You have pain or burning when you urinate. Get help right away if:  You have a fever or chills.  You have vaginal bleeding.  You are leaking fluid from your vagina.  You are passing tissue from your vagina.  You have vomiting or diarrhea that lasts for more than 24 hours.  Your baby is moving less than usual.  You feel very weak or faint.  You have shortness of breath.  You develop severe pain in your upper  abdomen. Summary  Abdominal pain is common during pregnancy, and has many possible causes.  If you experience abdominal pain during pregnancy, tell your health care provider right away.  Follow your health care provider's home care instructions and keep all follow-up visits as directed. This information is not intended to replace advice given to you by your health care provider. Make sure you discuss any questions you have with your health care provider. Document Released: 05/26/2005 Document Revised: 09/13/2018 Document Reviewed: 08/28/2016 Elsevier Patient Education  2020 [employer].  Fetal Movement Counts Patient Name: ________________________________________________ Patient Due Date: ____________________ What is a fetal movement count?  A fetal movement count is the number of times that you feel your baby move during a certain amount of time. This may also be called a fetal kick count. A fetal movement count is recommended for every pregnant woman. You may be asked to start counting fetal movements as early as week 28 of your pregnancy. Pay attention to when your baby is most active. You may notice your baby's sleep and wake cycles. You may also notice things that make your baby move more. You should do a fetal movement count:  When your baby is normally most active.  At the same time each day. A good time to count movements is while you  are resting, after having something to eat and drink. How do I count fetal movements? 1. Find a quiet, comfortable area. Sit, or lie down on your side. 2. Write down the date, the start time and stop time, and the number of movements that you felt between those two times. Take this information with you to your health care visits. 3. For 2 hours, count kicks, flutters, swishes, rolls, and jabs. You should feel at least 10 movements during 2 hours. 4. You may stop counting after you have felt 10 movements. 5. If you do not feel 10 movements in 2  hours, have something to eat and drink. Then, keep resting and counting for 1 hour. If you feel at least 4 movements during that hour, you may stop counting. Contact a health care provider if:  You feel fewer than 4 movements in 2 hours.  Your baby is not moving like he or she usually does. Date: ____________ Start time: ____________ Stop time: ____________ Movements: ____________ Date: ____________ Start time: ____________ Stop time: ____________ Movements: ____________ Date: ____________ Start time: ____________ Stop time: ____________ Movements: ____________ Date: ____________ Start time: ____________ Stop time: ____________ Movements: ____________ Date: ____________ Start time: ____________ Stop time: ____________ Movements: ____________ Date: ____________ Start time: ____________ Stop time: ____________ Movements: ____________ Date: ____________ Start time: ____________ Stop time: ____________ Movements: ____________ Date: ____________ Start time: ____________ Stop time: ____________ Movements: ____________ Date: ____________ Start time: ____________ Stop time: ____________ Movements: ____________ This information is not intended to replace advice given to you by your health care provider. Make sure you discuss any questions you have with your health care provider. Document Released: 06/25/2006 Document Revised: 06/15/2018 Document Reviewed: 07/05/2015 Elsevier Patient Education  2020 [employer]. Signs and Symptoms of Labor Labor is your body's natural process of moving your baby, placenta, and umbilical cord out of your uterus. The process of labor usually starts when your baby is full-term, between 53 and 40 weeks of pregnancy. How will I know when I am close to going into labor? As your body prepares for labor and the birth of your baby, you may notice the following symptoms in the weeks and days before true labor starts:  Having a strong desire to get your home ready to receive  your new baby. This is called nesting. Nesting may be a sign that labor is approaching, and it may occur several weeks before birth. Nesting may involve cleaning and organizing your home.  Passing a small amount of thick, bloody mucus out of your vagina (normal bloody show or losing your mucus plug). This may happen more than a week before labor begins, or it might occur right before labor begins as the opening of the cervix starts to widen (dilate). For some women, the entire mucus plug passes at once. For others, smaller portions of the mucus plug may gradually pass over several days.  Your baby moving (dropping) lower in your pelvis to get into position for birth (lightening). When this happens, you may feel more pressure on your bladder and pelvic bone and less pressure on your ribs. This may make it easier to breathe. It may also cause you to need to urinate more often and have problems with bowel movements.  Having "practice contractions" (Braxton Hicks contractions) that occur at irregular (unevenly spaced) intervals that are more than 10 minutes apart. This is also called false labor. False labor contractions are common after exercise or sexual activity, and they will stop if you change position, rest,  or drink fluids. These contractions are usually mild and do not get stronger over time. They may feel like: ? A backache or back pain. ? Mild cramps, similar to menstrual cramps. ? Tightening or pressure in your abdomen. Other early symptoms that labor may be starting soon include:  Nausea or loss of appetite.  Diarrhea.  Having a sudden burst of energy, or feeling very tired.  Mood changes.  Having trouble sleeping. How will I know when labor has begun? Signs that true labor has begun may include:  Having contractions that come at regular (evenly spaced) intervals and increase in intensity. This may feel like more intense tightening or pressure in your abdomen that moves to your  back. ? Contractions may also feel like rhythmic pain in your upper thighs or back that comes and goes at regular intervals. ? For first-time mothers, this change in intensity of contractions often occurs at a more gradual pace. ? Women who have given birth before may notice a more rapid progression of contraction changes.  Having a feeling of pressure in the vaginal area.  Your water breaking (rupture of membranes). This is when the sac of fluid that surrounds your baby breaks. When this happens, you will notice fluid leaking from your vagina. This may be clear or blood-tinged. Labor usually starts within 24 hours of your water breaking, but it may take longer to begin. ? Some women notice this as a gush of fluid. ? Others notice that their underwear repeatedly becomes damp. Follow these instructions at home:   When labor starts, or if your water breaks, call your health care provider or nurse care line. Based on your situation, they will determine when you should go in for an exam.  When you are in early labor, you may be able to rest and manage symptoms at home. Some strategies to try at home include: ? Breathing and relaxation techniques. ? Taking a warm bath or shower. ? Listening to music. ? Using a heating pad on the lower back for pain. If you are directed to use heat:  Place a towel between your skin and the heat source.  Leave the heat on for 20-30 minutes.  Remove the heat if your skin turns bright red. This is especially important if you are unable to feel pain, heat, or cold. You may have a greater risk of getting burned. Get help right away if:  You have painful, regular contractions that are 5 minutes apart or less.  Labor starts before you are [redacted] weeks along in your pregnancy.  You have a fever.  You have a headache that does not go away.  You have bright red blood coming from your vagina.  You do not feel your baby moving.  You have a sudden onset  of: ? Severe headache with vision problems. ? Nausea, vomiting, or diarrhea. ? Chest pain or shortness of breath. These symptoms may be an emergency. If your health care provider recommends that you go to the hospital or birth center where you plan to deliver, do not drive yourself. Have someone else drive you, or call emergency services (911 in the [place] Summary  Labor is your body's natural process of moving your baby, placenta, and umbilical cord out of your uterus.  The process of labor usually starts when your baby is full-term, between 35 and 40 weeks of pregnancy.  When labor starts, or if your water breaks, call your health care provider or nurse care line. Based on  your situation, they will determine when you should go in for an exam. This information is not intended to replace advice given to you by your health care provider. Make sure you discuss any questions you have with your health care provider. Document Released: 10/31/2016 Document Revised: 02/23/2017 Document Reviewed: 10/31/2016 Elsevier Patient Education  2020 [employer].

## 2019-01-22 NOTE — MAU Note (Signed)
Pt here for contractions that started earlier today, but has not timed them. Also feels like her heart is racing. Reports good fetal movement today. Reports yellowish/whitish discharge. Denies itching or odor. No bleeding.

## 2019-01-22 NOTE — MAU Provider Note (Signed)
History     CSN: [id number]  Arrival date and time: 01/22/19 8676   First Provider Initiated Contact with Patient 01/22/19 2023      Chief Complaint  Patient presents with  . Contractions   Ms. Michele Alexander is a 23 y.o. [phone] at [redacted]w[redacted]d who presents to MAU for contractions. Pt reports earlier today she was having contractions that were strong enough she was having difficulty breathing through them. Pt reports in MAU she is "barely having anything." Pt denies anything in the vagina in the past 24hrs.  Onset: this morning, about 1100 Location: stomach Duration: <24hrs Character: earlier today, pt reports ctx were frequent but pt did not time them, pt was having difficulty talking through them, pt reports tightening of entire abdomen Aggravating/Associated: none/none Relieving: none Treatment: none Severity: 0/10  Pt denies VB, LOF, ctx, decreased FM, vaginal discharge/odor/itching. Pt denies N/V, abdominal pain, constipation, diarrhea, or urinary problems. Pt denies fever, chills, fatigue, sweating or changes in appetite. Pt denies SOB or chest pain. Pt denies dizziness, HA, light-headedness, weakness.  Problems this pregnancy include: anemia, frequent UTI, yeast infection. Allergies? Nexium, cheese, contrast dye Current medications/supplements? PNVs, Fe Prenatal care provider? [facility], next appt 01/25/2019   OB History    Gravida  7   Para  1   Term  1   Preterm  0   AB  5   Living  1     SAB  5   TAB  0   Ectopic  0   Multiple  0   Live Births  1           Past Medical History:  Diagnosis Date  . Anemia   . Anxiety   . Blood transfusion without reported diagnosis   . Cancer ([place])    Hodgkin's Lymphoma  . Depression   . Hodgkin's disease(201)   . Sickle cell trait ([place])    traits    Past Surgical History:  Procedure Laterality Date  . DILATION AND EVACUATION N/A 12/21/2015   Procedure: DILATATION AND EVACUATION;  Surgeon: Will Bonnet, MD;  Location: [facility] ORS;  Service: Gynecology;  Laterality: N/A;  . NECK LESION BIOPSY    . OTHER SURGICAL HISTORY N/A 2015   Endoscopy  . PORT-A-CATH REMOVAL    . PORTACATH PLACEMENT Left 2010    Family History  Problem Relation Age of Onset  . Hypertension Mother   . Diabetes Maternal Grandfather   . Hypertension Maternal Grandfather   . Cancer Maternal Grandfather   . Cancer Maternal Grandmother   . Asthma Sister   . Asthma Daughter     Social History   Tobacco Use  . Smoking status: Never Smoker  . Smokeless tobacco: Never Used  Substance Use Topics  . Alcohol use: No  . Drug use: No    Allergies:  Allergies  Allergen Reactions  . Other Hives    Oral CT contrast Oral CT contrast  . Diatrizoate Hives    PO only.  Tolerates IV contrast.  . Iodinated Diagnostic Agents Hives  . Nexium [Esomeprazole Magnesium] Swelling  . Cheese Rash and Swelling    Lip swelling    Medications Prior to Admission  Medication Sig Dispense Refill Last Dose  . cyclobenzaprine (FLEXERIL) 10 MG tablet Take 1 tablet (10 mg total) by mouth 2 (two) times daily as needed for muscle spasms. 20 tablet 0   . Doxylamine-Pyridoxine (DICLEGIS) 10-10 MG TBEC 2 tabs q hs, if sx persist add  1 tab q am on day 3, if sx persist add 1 tab q afternoon on day 4 100 tablet 6   . ferrous sulfate 325 (65 FE) MG tablet Take 1 tablet (325 mg total) by mouth 2 (two) times daily with a meal. 60 tablet 3   . nitrofurantoin, macrocrystal-monohydrate, (MACROBID) 100 MG capsule Take 1 capsule (100 mg total) by mouth 2 (two) times daily. 14 capsule 0   . phenazopyridine (PYRIDIUM) 200 MG tablet Take 1 tablet (200 mg total) by mouth 3 (three) times daily with meals. 10 tablet 0   . Prenatal Vit-Fe Fumarate-FA (PRENATAL VITAMIN PO) Take by mouth.       Review of Systems  Constitutional: Negative for chills, diaphoresis, fatigue and fever.  Respiratory: Negative for shortness of breath.   Cardiovascular:  Negative for chest pain.  Gastrointestinal: Negative for abdominal pain, constipation, diarrhea, nausea and vomiting.  Genitourinary: Negative for dysuria, flank pain, frequency, pelvic pain, urgency, vaginal bleeding and vaginal discharge.  Neurological: Negative for dizziness, weakness, light-headedness and headaches.   Physical Exam   Blood pressure 104/75, pulse (!) 111, temperature 98.6 F (37 C), temperature source Oral, resp. rate 18, height 4\' 11"  (1.499 m), weight 47.7 kg, last menstrual period 06/24/2018, SpO2 100 %, unknown if currently breastfeeding.  Patient Vitals for the past 24 hrs:  BP Temp Temp src Pulse Resp SpO2 Height Weight  01/22/19 1950 104/75 - - (!) 111 - - - -  01/22/19 1939 121/75 98.6 F (37 C) Oral (!) 121 18 100 % 4\' 11"  (1.499 m) 47.7 kg   Physical Exam  Constitutional: She is oriented to person, place, and time. She appears well-developed and well-nourished. No distress.  HENT:  Head: Normocephalic and atraumatic.  Respiratory: Effort normal.  GI: Soft. She exhibits no distension and no mass. There is no abdominal tenderness. There is no rebound and no guarding.  Genitourinary: There is no rash, tenderness or lesion on the right labia. There is no rash, tenderness or lesion on the left labia.    Genitourinary Comments: CE: long/closed/posterior   Neurological: She is alert and oriented to person, place, and time.  Skin: Skin is warm and dry. She is not diaphoretic.  Psychiatric: She has a normal mood and affect. Her behavior is normal. Judgment and thought content normal.   Results for orders placed or performed during the hospital encounter of 01/22/19 (from the past 24 hour(s))  Urinalysis, Routine w reflex microscopic     Status: Abnormal   Collection Time: 01/22/19  8:03 PM  Result Value Ref Range   Color, Urine YELLOW YELLOW   APPearance HAZY (A) CLEAR   Specific Gravity, Urine 1.018 1.005 - 1.030   pH 6.0 5.0 - 8.0   Glucose, UA 50 (A)  NEGATIVE mg/dL   Hgb urine dipstick NEGATIVE NEGATIVE   Bilirubin Urine NEGATIVE NEGATIVE   Ketones, ur 5 (A) NEGATIVE mg/dL   Protein, ur NEGATIVE NEGATIVE mg/dL   Nitrite NEGATIVE NEGATIVE   Leukocytes,Ua TRACE (A) NEGATIVE   RBC / HPF 0-5 0 - 5 RBC/hpf   WBC, UA 6-10 0 - 5 WBC/hpf   Bacteria, UA NONE SEEN NONE SEEN   Squamous Epithelial / LPF 6-10 0 - 5  Fetal fibronectin     Status: None   Collection Time: 01/22/19  8:45 PM  Result Value Ref Range   Fetal Fibronectin NEGATIVE NEGATIVE   No results found.  MAU Course  Procedures  MDM -ctx earlier today, none in  MAU -UA: hazy/50GLU/5ketones/trace leuks, sending urine for culture based on symptoms -CE: long/closed/posterior -fFN: negative -EFM: reactive with variables       -baseline: 145/135       -variability: moderate       -accels: present, 10x10       -decels: few variables       -TOCO: no ctx on monitor -pt discharged to home in stable condition  Orders Placed This Encounter  Procedures  . Culture, OB Urine    Standing Status:   Standing    Number of Occurrences:   1  . Urinalysis, Routine w reflex microscopic    Standing Status:   Standing    Number of Occurrences:   1  . Fetal fibronectin    Standing Status:   Standing    Number of Occurrences:   1   No orders of the defined types were placed in this encounter.  Assessment and Plan   1. Preterm contractions   2. [redacted] weeks gestation of pregnancy   3. NST (non-stress test) reactive    Allergies as of 01/22/2019      Reactions   Other Hives   Oral CT contrast Oral CT contrast   Diatrizoate Hives   PO only.  Tolerates IV contrast.   Iodinated Diagnostic Agents Hives   Nexium [esomeprazole Magnesium] Swelling   Cheese Rash, Swelling   Lip swelling      Medication List    TAKE these medications   cyclobenzaprine 10 MG tablet Commonly known as: FLEXERIL Take 1 tablet (10 mg total) by mouth 2 (two) times daily as needed for muscle spasms.    Doxylamine-Pyridoxine 10-10 MG Tbec Commonly known as: Diclegis 2 tabs q hs, if sx persist add 1 tab q am on day 3, if sx persist add 1 tab q afternoon on day 4   ferrous sulfate 325 (65 FE) MG tablet Take 1 tablet (325 mg total) by mouth 2 (two) times daily with a meal.   nitrofurantoin (macrocrystal-monohydrate) 100 MG capsule Commonly known as: Macrobid Take 1 capsule (100 mg total) by mouth 2 (two) times daily.   phenazopyridine 200 MG tablet Commonly known as: Pyridium Take 1 tablet (200 mg total) by mouth 3 (three) times daily with meals.   PRENATAL VITAMIN PO Take by mouth.      -will call with culture results, if positive -discussed common causes of preterm ctx and non-pharmacologic ways to alleviate ctx at home -discussed Braxton-Hicks vs. True Labor -strict PTL/return MAU precautions given -pt discharged to home in stable condition  Gerrie Nordmann  01/22/2019, 9:27 PM

## 2019-01-24 LAB — CULTURE, OB URINE: Culture: NO GROWTH

## 2019-01-25 ENCOUNTER — Telehealth (INDEPENDENT_AMBULATORY_CARE_PROVIDER_SITE_OTHER): Payer: Medicaid Other | Admitting: Obstetrics & Gynecology

## 2019-01-25 ENCOUNTER — Encounter: Payer: Self-pay | Admitting: Obstetrics & Gynecology

## 2019-01-25 VITALS — BP 105/75 | HR 106 | Ht 59.0 in

## 2019-01-25 DIAGNOSIS — Z3A30 30 weeks gestation of pregnancy: Secondary | ICD-10-CM | POA: Diagnosis not present

## 2019-01-25 DIAGNOSIS — Z3483 Encounter for supervision of other normal pregnancy, third trimester: Secondary | ICD-10-CM

## 2019-01-25 NOTE — Progress Notes (Signed)
   TELEHEALTH [facility] VIRTUAL OBSTETRICS VISIT ENCOUNTER NOTE  I connected with Michele Alexander on 01/25/19 at  2:15 PM EDT by telephone at home and verified that I am speaking with the correct person using two identifiers.   I discussed the limitations, risks, security and privacy concerns of performing an evaluation and management service by telephone and the availability of in person appointments. I also discussed with the patient that there may be a patient responsible charge related to this service. The patient expressed understanding and agreed to proceed.  Subjective:  Michele Alexander is a 23 y.o. [phone] at [redacted]w[redacted]d being followed for ongoing prenatal care.  She is currently monitored for the following issues for this low-risk pregnancy and has History of Hodgkin's lymphoma; Sickle cell trait ([place]); Supervision of normal pregnancy; UTI (urinary tract infection) during pregnancy; and Vaginal discharge during pregnancy on their problem list.  Patient reports no complaints. Reports fetal movement. Denies any contractions, bleeding or leaking of fluid.    The following portions of the patient's history were reviewed and updated as appropriate: allergies, current medications, past family history, past medical history, past social history, past surgical history and problem list.   Objective:   General:  Alert, oriented and cooperative.   Mental Status: Normal mood and affect perceived. Normal judgment and thought content.  Rest of physical exam deferred due to type of encounter  Assessment and Plan:  Pregnancy: G7P1051 at [redacted]w[redacted]d 1. Encounter for supervision of other normal pregnancy in third trimester Pelvic pressure  Preterm labor symptoms and general obstetric precautions including but not limited to vaginal bleeding, contractions, leaking of fluid and fetal movement were reviewed in detail with the patient.  I discussed the assessment and treatment plan with the patient. The patient was  provided an opportunity to ask questions and all were answered. The patient agreed with the plan and demonstrated an understanding of the instructions. The patient was advised to call back or seek an in-person office evaluation/go to MAU at [facility] for any urgent or concerning symptoms. Please refer to After Visit Summary for other counseling recommendations.   I provided 11 minutes of non-face-to-face time during this encounter.  Return in about 3 weeks (around 02/15/2019) for LROB.  No future appointments.  Florian Buff, MD [facility], [place]

## 2019-01-31 ENCOUNTER — Other Ambulatory Visit: Payer: Self-pay | Admitting: Women's Health

## 2019-02-02 ENCOUNTER — Inpatient Hospital Stay (HOSPITAL_COMMUNITY)
Admission: AD | Admit: 2019-02-02 | Discharge: 2019-02-03 | Disposition: A | Discharge status: Home / Self Care | Payer: Medicaid Other | Attending: Obstetrics & Gynecology | Admitting: Obstetrics & Gynecology

## 2019-02-02 ENCOUNTER — Other Ambulatory Visit: Payer: Self-pay

## 2019-02-02 ENCOUNTER — Encounter (HOSPITAL_COMMUNITY): Payer: Self-pay

## 2019-02-02 DIAGNOSIS — Z3A31 31 weeks gestation of pregnancy: Secondary | ICD-10-CM | POA: Insufficient documentation

## 2019-02-02 DIAGNOSIS — O99013 Anemia complicating pregnancy, third trimester: Secondary | ICD-10-CM | POA: Insufficient documentation

## 2019-02-02 DIAGNOSIS — Z91018 Allergy to other foods: Secondary | ICD-10-CM | POA: Insufficient documentation

## 2019-02-02 DIAGNOSIS — Z8571 Personal history of Hodgkin lymphoma: Secondary | ICD-10-CM | POA: Insufficient documentation

## 2019-02-02 DIAGNOSIS — O4703 False labor before 37 completed weeks of gestation, third trimester: Secondary | ICD-10-CM | POA: Diagnosis not present

## 2019-02-02 DIAGNOSIS — Z3689 Encounter for other specified antenatal screening: Secondary | ICD-10-CM | POA: Insufficient documentation

## 2019-02-02 DIAGNOSIS — D573 Sickle-cell trait: Secondary | ICD-10-CM | POA: Diagnosis not present

## 2019-02-02 DIAGNOSIS — Z833 Family history of diabetes mellitus: Secondary | ICD-10-CM | POA: Insufficient documentation

## 2019-02-02 DIAGNOSIS — E876 Hypokalemia: Secondary | ICD-10-CM | POA: Diagnosis not present

## 2019-02-02 DIAGNOSIS — O99283 Endocrine, nutritional and metabolic diseases complicating pregnancy, third trimester: Secondary | ICD-10-CM | POA: Diagnosis not present

## 2019-02-02 DIAGNOSIS — D509 Iron deficiency anemia, unspecified: Secondary | ICD-10-CM | POA: Diagnosis not present

## 2019-02-02 DIAGNOSIS — B9689 Other specified bacterial agents as the cause of diseases classified elsewhere: Secondary | ICD-10-CM

## 2019-02-02 DIAGNOSIS — Z809 Family history of malignant neoplasm, unspecified: Secondary | ICD-10-CM | POA: Insufficient documentation

## 2019-02-02 DIAGNOSIS — Z91041 Radiographic dye allergy status: Secondary | ICD-10-CM | POA: Insufficient documentation

## 2019-02-02 DIAGNOSIS — N760 Acute vaginitis: Secondary | ICD-10-CM

## 2019-02-02 DIAGNOSIS — Z3A32 32 weeks gestation of pregnancy: Secondary | ICD-10-CM | POA: Diagnosis not present

## 2019-02-02 DIAGNOSIS — Z888 Allergy status to other drugs, medicaments and biological substances status: Secondary | ICD-10-CM | POA: Diagnosis not present

## 2019-02-02 DIAGNOSIS — O23593 Infection of other part of genital tract in pregnancy, third trimester: Secondary | ICD-10-CM | POA: Diagnosis not present

## 2019-02-02 LAB — URINALYSIS, ROUTINE W REFLEX MICROSCOPIC
Bilirubin Urine: NEGATIVE
Glucose, UA: NEGATIVE mg/dL
Hgb urine dipstick: NEGATIVE
Ketones, ur: NEGATIVE mg/dL
Leukocytes,Ua: NEGATIVE
Nitrite: NEGATIVE
Protein, ur: NEGATIVE mg/dL
Specific Gravity, Urine: 1.02 (ref 1.005–1.030)
pH: 6 (ref 5.0–8.0)

## 2019-02-02 LAB — COMPREHENSIVE METABOLIC PANEL
ALT: <5 U/L (ref 0–44)
AST: 20 U/L (ref 15–41)
Albumin: 2.6 g/dL — ABNORMAL LOW (ref 3.5–5.0)
Alkaline Phosphatase: 162 U/L — ABNORMAL HIGH (ref 38–126)
Anion gap: 10 (ref 5–15)
BUN: <5 mg/dL — ABNORMAL LOW (ref 6–20)
CO2: 20 mmol/L — ABNORMAL LOW (ref 22–32)
Calcium: 8.2 mg/dL — ABNORMAL LOW (ref 8.9–10.3)
Chloride: 104 mmol/L (ref 98–111)
Creatinine, Ser: 0.4 mg/dL — ABNORMAL LOW (ref 0.44–1.00)
GFR calc Af Amer: >60 mL/min (ref >60)
GFR calc non Af Amer: >60 mL/min (ref >60)
Glucose, Bld: 87 mg/dL (ref 70–99)
Potassium: 3.1 mmol/L — ABNORMAL LOW (ref 3.5–5.1)
Sodium: 134 mmol/L — ABNORMAL LOW (ref 135–145)
Total Bilirubin: 0.4 mg/dL (ref 0.3–1.2)
Total Protein: 5.8 g/dL — ABNORMAL LOW (ref 6.5–8.1)

## 2019-02-02 LAB — WET PREP, GENITAL
Sperm: NONE SEEN
Trich, Wet Prep: NONE SEEN
Yeast Wet Prep HPF POC: NONE SEEN

## 2019-02-02 LAB — CBC
HCT: 27.5 % — ABNORMAL LOW (ref 36.0–46.0)
Hemoglobin: 8.4 g/dL — ABNORMAL LOW (ref 12.0–15.0)
MCH: 21.5 pg — ABNORMAL LOW (ref 26.0–34.0)
MCHC: 30.5 g/dL (ref 30.0–36.0)
MCV: 70.3 fL — ABNORMAL LOW (ref 80.0–100.0)
Platelets: 436 10*3/uL — ABNORMAL HIGH (ref 150–400)
RBC: 3.91 MIL/uL (ref 3.87–5.11)
RDW: 16 % — ABNORMAL HIGH (ref 11.5–15.5)
WBC: 11.2 10*3/uL — ABNORMAL HIGH (ref 4.0–10.5)
nRBC: 0.2 % (ref 0.0–0.2)

## 2019-02-02 LAB — PROTEIN / CREATININE RATIO, URINE
Creatinine, Urine: 123.88 mg/dL
Protein Creatinine Ratio: 0.15 mg/mg{Cre} (ref 0.00–0.15)
Total Protein, Urine: 19 mg/dL

## 2019-02-02 MED ORDER — POTASSIUM CHLORIDE 10 MEQ/100ML IV SOLN
10.0000 meq | INTRAVENOUS | Status: AC
  Administered 2019-02-02 – 2019-02-03 (×2): 10 meq via INTRAVENOUS
  Filled 2019-02-02 (×2): qty 100

## 2019-02-02 MED ORDER — ONDANSETRON HCL 4 MG PO TABS
4.0000 mg | ORAL_TABLET | Freq: Once | ORAL | Status: AC
  Administered 2019-02-02: 22:00:00 4 mg via ORAL
  Filled 2019-02-02: qty 1

## 2019-02-02 MED ORDER — LACTATED RINGERS IV SOLN
INTRAVENOUS | Status: DC
  Administered 2019-02-02: 23:00:00 via INTRAVENOUS

## 2019-02-02 MED ORDER — SODIUM CHLORIDE 0.9 % IV SOLN
510.0000 mg | Freq: Once | INTRAVENOUS | Status: AC
  Administered 2019-02-03: 510 mg via INTRAVENOUS
  Filled 2019-02-02: qty 17

## 2019-02-02 MED ORDER — TERBUTALINE SULFATE 1 MG/ML IJ SOLN
0.2500 mg | Freq: Once | INTRAMUSCULAR | Status: AC
  Administered 2019-02-02: 0.25 mg via SUBCUTANEOUS
  Filled 2019-02-02: qty 1

## 2019-02-02 MED ORDER — NIFEDIPINE 10 MG PO CAPS
10.0000 mg | ORAL_CAPSULE | ORAL | Status: DC | PRN
  Administered 2019-02-02 (×2): 10 mg via ORAL
  Filled 2019-02-02 (×2): qty 1

## 2019-02-02 MED ORDER — METRONIDAZOLE 500 MG PO TABS: 500.0000 mg | ORAL_TABLET | Freq: Two times a day (BID) | ORAL | 0 refills | Status: AC

## 2019-02-02 NOTE — MAU Note (Signed)
.   Michele Alexander is a 23 y.o. at [redacted]w[redacted]d here in MAU reporting: contractions and she states she thought she lost her mucous plug last night. Denies any vaginal bleeding  Onset of complaint: last night Pain score: 7 Vitals:   02/02/19 1740 02/02/19 1741  BP: 112/74   Pulse: (!) 121   Resp: 16   Temp: 97.6 F (36.4 C)   SpO2:  100%     FHT:155 Lab orders placed from triage: UA

## 2019-02-02 NOTE — Discharge Instructions (Signed)

## 2019-02-02 NOTE — MAU Provider Note (Addendum)
History     CSN: [id number]  Arrival date and time: 02/02/19 1727   First Provider Initiated Contact with Patient 02/02/19 1820      Chief Complaint  Patient presents with  . Contractions   Michele Alexander is a 23 y.o. [phone] F who is [redacted]w[redacted]d by LMP who presents for loss of mucus plug and back contractions. She states she lost a think, white clump of mucus 2 days ago and was concerned since her discharge was more watery prior to that. She has also had racing heart and felt palpitations since then. She states that she has been feeling back contractions since last night, noting she had back contractions in a prior pregnancy. She has felt these contractions 5-9 mins apart and states they feel like they are getting stronger. She has also felt lower pelvic pressure. She also reports intermittent headaches throughout this pregnancy and describes them as tension in her forehead with pulsating in her temporal areas. She has tried tylenol with no improvement in her headaches. She does not currently have a headache. No visual changes. She also reports feet and hand swelling throughout this pregnancy and notes some tingling/numbness in her legs sometimes. She has been mostly stationary due to her pressure and back contractions. Denies nausea, vomiting, fever, chills, constipation, or urinary symptoms. States urine has been "dark" but has not seen blood in urine. Denies chest pain or shortness of breath.   OB History    Gravida  7   Para  1   Term  1   Preterm  0   AB  5   Living  1     SAB  5   TAB  0   Ectopic  0   Multiple  0   Live Births  1           Past Medical History:  Diagnosis Date  . Anemia   . Anxiety   . Blood transfusion without reported diagnosis   . Cancer ([place])    Hodgkin's Lymphoma  . Depression   . Hodgkin's disease(201)   . Sickle cell trait ([place])    traits    Past Surgical History:  Procedure Laterality Date  . DILATION AND EVACUATION N/A 12/21/2015    Procedure: DILATATION AND EVACUATION;  Surgeon: Will Bonnet, MD;  Location: [facility] ORS;  Service: Gynecology;  Laterality: N/A;  . NECK LESION BIOPSY    . OTHER SURGICAL HISTORY N/A 2015   Endoscopy  . PORT-A-CATH REMOVAL    . PORTACATH PLACEMENT Left 2010    Family History  Problem Relation Age of Onset  . Hypertension Mother   . Diabetes Maternal Grandfather   . Hypertension Maternal Grandfather   . Cancer Maternal Grandfather   . Cancer Maternal Grandmother   . Asthma Sister   . Asthma Daughter     Social History   Tobacco Use  . Smoking status: Never Smoker  . Smokeless tobacco: Never Used  Substance Use Topics  . Alcohol use: No  . Drug use: No    Allergies:  Allergies  Allergen Reactions  . Other Hives    Oral CT contrast Oral CT contrast  . Diatrizoate Hives    PO only.  Tolerates IV contrast.  . Iodinated Diagnostic Agents Hives  . Nexium [Esomeprazole Magnesium] Swelling  . Cheese Rash and Swelling    Lip swelling    Medications Prior to Admission  Medication Sig Dispense Refill Last Dose  . ferrous sulfate 325 (65 FE)  MG tablet Take 1 tablet (325 mg total) by mouth 2 (two) times daily with a meal. 60 tablet 3 02/02/2019 at Unknown time  . Prenatal Vit-Fe Fumarate-FA (PRENATAL VITAMIN PO) Take by mouth.   02/02/2019 at Unknown time  . cyclobenzaprine (FLEXERIL) 10 MG tablet Take 1 tablet (10 mg total) by mouth 2 (two) times daily as needed for muscle spasms. 20 tablet 0 Unknown at Unknown time  . Doxylamine-Pyridoxine (DICLEGIS) 10-10 MG TBEC 2 tabs q hs, if sx persist add 1 tab q am on day 3, if sx persist add 1 tab q afternoon on day 4 (Patient not taking: Reported on 01/25/2019) 100 tablet 6 Unknown at Unknown time    Review of Systems  Constitutional: Negative for chills and fever.  Eyes: Negative for visual disturbance.  Respiratory: Negative for shortness of breath.   Cardiovascular: Negative for chest pain.  Gastrointestinal: Negative for  abdominal pain, constipation, diarrhea, nausea and vomiting.  Genitourinary: Positive for pelvic pain (pressure) and vaginal discharge (white mucus). Negative for difficulty urinating, dysuria, frequency, hematuria and vaginal bleeding.       "Dark" urine  Musculoskeletal: Positive for back pain ("contractions").  Neurological: Positive for numbness (legs) and headaches (frontal tension, bilateral temporal pulsating).   Physical Exam   Blood pressure 112/74, pulse (!) 121, temperature 97.6 F (36.4 C), resp. rate 16, last menstrual period 06/24/2018, SpO2 100 %.  Physical Exam  Vitals reviewed. Constitutional: She appears well-developed and well-nourished. No distress.  Cardiovascular: Regular rhythm and intact distal pulses.  No murmur heard. Tachycardic  Respiratory: Breath sounds normal. No respiratory distress. She has no wheezes.  GI: Bowel sounds are normal. She exhibits no distension and no mass. There is no abdominal tenderness. There is no rebound and no guarding.  Genitourinary:    Vagina normal.     Genitourinary Comments: Sterile speculum exam: Normal cervix. Mild thick white mucus visualized. Negative pooling.    Musculoskeletal:        General: No edema.     Comments: Tenderness to palpation in right lower back. Nontender in the rest of her back.  Skin: She is not diaphoretic.   FHT:135, moderate variability, good accelerations, no decelerations, reactive strip Toco: no contractions, irregular uterine irritability  MAU Course  Procedures Results for orders placed or performed during the hospital encounter of 02/02/19 (from the past 24 hour(s))  Wet prep, genital     Status: Abnormal   Collection Time: 02/02/19  6:50 PM   Specimen: Cervix  Result Value Ref Range   Yeast Wet Prep HPF POC NONE SEEN NONE SEEN   Trich, Wet Prep NONE SEEN NONE SEEN   Clue Cells Wet Prep HPF POC PRESENT (A) NONE SEEN   WBC, Wet Prep HPF POC FEW (A) NONE SEEN   Sperm NONE SEEN    Urinalysis, Routine w reflex microscopic     Status: None   Collection Time: 02/02/19  6:55 PM  Result Value Ref Range   Color, Urine YELLOW YELLOW   APPearance CLEAR CLEAR   Specific Gravity, Urine 1.020 1.005 - 1.030   pH 6.0 5.0 - 8.0   Glucose, UA NEGATIVE NEGATIVE mg/dL   Hgb urine dipstick NEGATIVE NEGATIVE   Bilirubin Urine NEGATIVE NEGATIVE   Ketones, ur NEGATIVE NEGATIVE mg/dL   Protein, ur NEGATIVE NEGATIVE mg/dL   Nitrite NEGATIVE NEGATIVE   Leukocytes,Ua NEGATIVE NEGATIVE  CBC     Status: Abnormal   Collection Time: 02/02/19  7:18 PM  Result Value Ref  Range   WBC 11.2 (H) 4.0 - 10.5 K/uL   RBC 3.91 3.87 - 5.11 MIL/uL   Hemoglobin 8.4 (L) 12.0 - 15.0 g/dL   HCT 27.5 (L) 36.0 - 46.0 %   MCV 70.3 (L) 80.0 - 100.0 fL   MCH 21.5 (L) 26.0 - 34.0 pg   MCHC 30.5 30.0 - 36.0 g/dL   RDW 16.0 (H) 11.5 - 15.5 %   Platelets 436 (H) 150 - 400 K/uL   nRBC 0.2 0.0 - 0.2 %  Comprehensive metabolic panel     Status: Abnormal   Collection Time: 02/02/19  7:18 PM  Result Value Ref Range   Sodium 134 (L) 135 - 145 mmol/L   Potassium 3.1 (L) 3.5 - 5.1 mmol/L   Chloride 104 98 - 111 mmol/L   CO2 20 (L) 22 - 32 mmol/L   Glucose, Bld 87 70 - 99 mg/dL   BUN <5 (L) 6 - 20 mg/dL   Creatinine, Ser 0.40 (L) 0.44 - 1.00 mg/dL   Calcium 8.2 (L) 8.9 - 10.3 mg/dL   Total Protein 5.8 (L) 6.5 - 8.1 g/dL   Albumin 2.6 (L) 3.5 - 5.0 g/dL   AST 20 15 - 41 U/L   ALT <5 0 - 44 U/L   Alkaline Phosphatase 162 (H) 38 - 126 U/L   Total Bilirubin 0.4 0.3 - 1.2 mg/dL   GFR calc non Af Amer >60 >60 mL/min   GFR calc Af Amer >60 >60 mL/min   Anion gap 10 5 - 15  Protein / creatinine ratio, urine     Status: None   Collection Time: 02/02/19  7:19 PM  Result Value Ref Range   Creatinine, Urine 123.88 mg/dL   Total Protein, Urine 19 mg/dL   Protein Creatinine Ratio 0.15 0.00 - 0.15 mg/mg[Cre]    MDM R/o preterm labor R/o pre-E (hx of HA with this pregnancy and palpitations, subjective  swelling) CMP, CBC, P/Cr, GC/Chlamydia, Wet Prep, UA  Assessment and Plan  23 yo [id number] at [redacted]w[redacted]d with uterine contractions and increased mucous discharged. Likely uterine irritability/discharge due to bacterial vaginosis. -[facility] script sent -Follow up as scheduled with [facility] -Return precautions given -Sign out given to night time provider   Hailey L Sparacino 02/02/2019, 8:02 PM   Reassessment (9:35 PM)  -Nurse reports patient with onset of contractions. -Strip reviewed.   *FHR 145 bpm, Mod Var, -Decels, +Accels Toco: Q 2-3 min, palpates -Patient reports frequent contractions and rates them 8/10.  Endorses fetal movement and denies vaginal bleeding or leaking. -Patient declines pain medication, but reports nausea and requests medication.   -Results reviewed with patient including HgB, and K+ -Informed patient that MD would be contacted regarding treatment of K+ today or out patient via medication.  -Patient informed of need to increase iron pill to three times daily.  -Procardia protocol started and will continue to monitor. -Will continue to assess.   Reassessment (9:52 PM) -Dr. Hulan Fray consulted and agrees with recommendation to give K+ infusion and advises Feraheme infusion as well. -Patient updated on POC and has no questions or concerns.   Reassessment (11:27 PM)  -Patient with some relief of contractions, but now reports contractions picking up.  -Review of chart reveals patient only received 2 doses of procardia. -No significant change in VE. -Will give terbutaline now. -Infusions received and being initiated. -Patient without questions or concerns.   Reassessment (12:54 AM)  -Patient reports no perception of contractions. -Informed that we will start 2nd  dose potassium. -Will reassess cervix after completion of fluids.  Reassessment (2:29 AM)  -Cervical exam remains the same. -Patient reports that her contractions remain undetectable. -Will discharge to  home. -Discussed pelvic rest until at least 36 weeks and usage of procardia for contractions >/= 6/hr. -Rx for Procardia sent to pharmacy on file.  -Patient reports that she has an appt on Sept 9th. -Will send message to FT for appt next week for follow up.  -Reiterated need to increase iron supplementation at home.  -Informed that repeat Feraheme infusion may be necessary next week.  -Patient without questions or concerns.  -Encouraged to call or return to MAU if symptoms worsen or with the onset of new symptoms. -Discharged to home in stable condition.  Maryann Conners MSN, CNM

## 2019-02-03 MED ORDER — FERROUS SULFATE 325 (65 FE) MG PO TABS: 325.0000 mg | ORAL_TABLET | Freq: Three times a day (TID) | ORAL | 3 refills | Status: DC

## 2019-02-03 MED ORDER — NIFEDIPINE 10 MG PO CAPS: 10.0000 mg | ORAL_CAPSULE | Freq: Four times a day (QID) | ORAL | 1 refills | Status: DC | PRN

## 2019-02-04 LAB — GC/CHLAMYDIA PROBE AMP (~~LOC~~) NOT AT ARMC
Chlamydia: NEGATIVE
Neisseria Gonorrhea: NEGATIVE

## 2019-02-07 ENCOUNTER — Other Ambulatory Visit: Payer: Self-pay

## 2019-02-07 ENCOUNTER — Inpatient Hospital Stay (HOSPITAL_COMMUNITY)
Admission: AD | Admit: 2019-02-07 | Discharge: 2019-02-08 | Disposition: A | Discharge status: Home / Self Care | Payer: Medicaid Other | Source: Ambulatory Visit | Attending: Obstetrics and Gynecology | Admitting: Obstetrics and Gynecology

## 2019-02-07 ENCOUNTER — Telehealth: Payer: Self-pay | Admitting: *Deleted

## 2019-02-07 DIAGNOSIS — Z3483 Encounter for supervision of other normal pregnancy, third trimester: Secondary | ICD-10-CM

## 2019-02-07 DIAGNOSIS — Z0371 Encounter for suspected problem with amniotic cavity and membrane ruled out: Secondary | ICD-10-CM

## 2019-02-07 DIAGNOSIS — Z3A32 32 weeks gestation of pregnancy: Secondary | ICD-10-CM | POA: Insufficient documentation

## 2019-02-07 DIAGNOSIS — O212 Late vomiting of pregnancy: Secondary | ICD-10-CM | POA: Insufficient documentation

## 2019-02-07 DIAGNOSIS — Z8571 Personal history of Hodgkin lymphoma: Secondary | ICD-10-CM | POA: Insufficient documentation

## 2019-02-07 DIAGNOSIS — Z3689 Encounter for other specified antenatal screening: Secondary | ICD-10-CM

## 2019-02-07 DIAGNOSIS — O26893 Other specified pregnancy related conditions, third trimester: Secondary | ICD-10-CM | POA: Insufficient documentation

## 2019-02-07 NOTE — Telephone Encounter (Signed)
Called patient in response to her mychart message.  States she has had 6 contractions all day but has also had some diarrhea.  She was prescribed Procardia at the hospital and took one last night and did not feel like it helped but has not needed to take another one today.  Denies bleeding or leaking.  Encouraged patient to push fluids and rest and continue taking Flagyl for BV as this can sometimes be causing the cramping along with the diarrhea.  Advised if she started bleeding or having increased pain over night, to go to the hospital.  Pt verbalized understanding with all questions answered.

## 2019-02-08 ENCOUNTER — Encounter (HOSPITAL_COMMUNITY): Payer: Self-pay | Admitting: *Deleted

## 2019-02-08 DIAGNOSIS — Z3A32 32 weeks gestation of pregnancy: Secondary | ICD-10-CM | POA: Diagnosis not present

## 2019-02-08 DIAGNOSIS — O26893 Other specified pregnancy related conditions, third trimester: Secondary | ICD-10-CM | POA: Diagnosis not present

## 2019-02-08 DIAGNOSIS — Z0371 Encounter for suspected problem with amniotic cavity and membrane ruled out: Secondary | ICD-10-CM | POA: Diagnosis not present

## 2019-02-08 DIAGNOSIS — O212 Late vomiting of pregnancy: Secondary | ICD-10-CM | POA: Diagnosis not present

## 2019-02-08 DIAGNOSIS — Z8571 Personal history of Hodgkin lymphoma: Secondary | ICD-10-CM | POA: Diagnosis not present

## 2019-02-08 LAB — URINALYSIS, ROUTINE W REFLEX MICROSCOPIC
Bilirubin Urine: NEGATIVE
Glucose, UA: NEGATIVE mg/dL
Hgb urine dipstick: NEGATIVE
Ketones, ur: NEGATIVE mg/dL
Nitrite: NEGATIVE
Protein, ur: NEGATIVE mg/dL
Specific Gravity, Urine: 1.013 (ref 1.005–1.030)
pH: 6 (ref 5.0–8.0)

## 2019-02-08 LAB — AMNISURE RUPTURE OF MEMBRANE (ROM) NOT AT ARMC: Amnisure ROM: NEGATIVE

## 2019-02-08 MED ORDER — ONDANSETRON 4 MG PO TBDP
4.0000 mg | ORAL_TABLET | Freq: Once | ORAL | Status: AC
  Administered 2019-02-08: 01:00:00 4 mg via ORAL
  Filled 2019-02-08: qty 1

## 2019-02-08 NOTE — MAU Note (Signed)
PT SAYS HAD YELLOW WATERY DIARRHEA  LAST NIGHT  - WITH UC'S AND AGAIN  THIS AM.  THEN  VOMITED AT 10PM-  THEN FELT A GUSH  OF  FLUID.   [facility]. Marland Kitchen LAST SEX-  APRIL

## 2019-02-08 NOTE — MAU Provider Note (Signed)
History     CSN: [id number]  Arrival date and time: 02/07/19 2342   First Provider Initiated Contact with Patient 02/08/19 0046      Chief Complaint  Patient presents with  . Rupture of Membranes    Michele Alexander is a 23 y.o. [id number] at [redacted]w[redacted]d who receives care at [facility].  She presents today for Rupture of Membranes.  She states she had an incident of vomiting at 10pm with a big gush of fluid.  She states the fluid was colorless and odorless and saturated through her pants and was on the bed and floor.  She endorses fetal movement, but reports it has been decreased today.  She reports irregular contractions, but nothing consistent.  She denies vaginal bleeding prior to this incident and reports discharge, but contributes it to her BV infection.  She also reports diarrhea on Sunday x 4, but has not had an incident of loss stool since Monday at 0830.  She states she is currently having back pain and cramping and notes that her pants was wet upon arrival.  However, she does not feel like she is actively leaking.      OB History    Gravida  7   Para  1   Term  1   Preterm  0   AB  5   Living  1     SAB  5   TAB  0   Ectopic  0   Multiple  0   Live Births  1           Past Medical History:  Diagnosis Date  . Anemia   . Anxiety   . Blood transfusion without reported diagnosis   . Cancer ([place])    Hodgkin's Lymphoma  . Depression   . Hodgkin's disease(201)   . Sickle cell trait ([place])    traits    Past Surgical History:  Procedure Laterality Date  . DILATION AND EVACUATION N/A 12/21/2015   Procedure: DILATATION AND EVACUATION;  Surgeon: Will Bonnet, MD;  Location: [facility] ORS;  Service: Gynecology;  Laterality: N/A;  . NECK LESION BIOPSY    . OTHER SURGICAL HISTORY N/A 2015   Endoscopy  . PORT-A-CATH REMOVAL    . PORTACATH PLACEMENT Left 2010    Family History  Problem Relation Age of Onset  . Hypertension Mother   . Diabetes Maternal  Grandfather   . Hypertension Maternal Grandfather   . Cancer Maternal Grandfather   . Cancer Maternal Grandmother   . Asthma Sister   . Asthma Daughter     Social History   Tobacco Use  . Smoking status: Never Smoker  . Smokeless tobacco: Never Used  Substance Use Topics  . Alcohol use: No  . Drug use: No    Allergies:  Allergies  Allergen Reactions  . Other Hives    Oral CT contrast Oral CT contrast  . Diatrizoate Hives    PO only.  Tolerates IV contrast.  . Iodinated Diagnostic Agents Hives  . Nexium [Esomeprazole Magnesium] Swelling  . Cheese Rash and Swelling    Lip swelling    Medications Prior to Admission  Medication Sig Dispense Refill Last Dose  . ferrous sulfate 325 (65 FE) MG tablet Take 1 tablet (325 mg total) by mouth 3 (three) times daily with meals. 60 tablet 3 02/07/2019 at Unknown time  . metroNIDAZOLE (FLAGYL) 500 MG tablet Take 1 tablet (500 mg total) by mouth 2 (two) times daily for 7 days.  14 tablet 0 Past Week at Unknown time  . NIFEdipine (PROCARDIA) 10 MG capsule Take 1 capsule (10 mg total) by mouth every 6 (six) hours as needed (Contractions). 15 capsule 1 02/07/2019 at Unknown time  . Prenatal Vit-Fe Fumarate-FA (PRENATAL VITAMIN PO) Take by mouth.   02/07/2019 at Unknown time  . cyclobenzaprine (FLEXERIL) 10 MG tablet Take 1 tablet (10 mg total) by mouth 2 (two) times daily as needed for muscle spasms. 20 tablet 0 More than a month at Unknown time  . Doxylamine-Pyridoxine (DICLEGIS) 10-10 MG TBEC 2 tabs q hs, if sx persist add 1 tab q am on day 3, if sx persist add 1 tab q afternoon on day 4 (Patient not taking: Reported on 01/25/2019) 100 tablet 6     Review of Systems  Constitutional: Negative for chills and fever.  Respiratory: Negative for cough and shortness of breath.   Gastrointestinal: Positive for abdominal pain, nausea and vomiting (At 10pm). Negative for diarrhea.  Genitourinary: Positive for pelvic pain and vaginal discharge. Negative  for difficulty urinating, dysuria and vaginal bleeding.  Musculoskeletal: Positive for back pain.  Neurological: Negative for dizziness, light-headedness and headaches.   Physical Exam   Blood pressure 103/65, pulse (!) 119, temperature 98.3 F (36.8 C), resp. rate 16, height 4\' 11"  (1.499 m), weight 49 kg, last menstrual period 06/24/2018, SpO2 99 %.  Physical Exam  Constitutional: She is oriented to person, place, and time. She appears well-developed and well-nourished. No distress.  HENT:  Head: Normocephalic and atraumatic.  Eyes: Conjunctivae are normal.  Neck: Normal range of motion.  Cardiovascular: Normal rate.  Respiratory: Effort normal.  Genitourinary: Cervix exhibits no motion tenderness, no discharge and no friability.    Vaginal discharge present.     No vaginal bleeding.  No bleeding in the vagina.    Genitourinary Comments: Sterile Speculum Exam: -Normal External Genitalia: Non tender, no apparent discharge at introitus.  -Vaginal Vault: Pink mucosa with good rugae. Scant amt of white discharge. Amnisure collected.  -Cervix:Pink, no lesions, cysts, or polyps.  Appears closed. No active bleeding or discharge from os- -Bimanual Exam:  Deferred   Musculoskeletal: Normal range of motion.  Neurological: She is alert and oriented to person, place, and time.  Skin: Skin is warm and dry.  Psychiatric: She has a normal mood and affect. Her behavior is normal.    Fetal Assessment 140 bpm, Mod Var, +One Variable Decels, +Accels Toco: None graphed or palpated  MAU Course   Results for orders placed or performed during the hospital encounter of 02/07/19 (from the past 24 hour(s))  Urinalysis, Routine w reflex microscopic     Status: Abnormal   Collection Time: 02/08/19 12:32 AM  Result Value Ref Range   Color, Urine YELLOW YELLOW   APPearance CLEAR CLEAR   Specific Gravity, Urine 1.013 1.005 - 1.030   pH 6.0 5.0 - 8.0   Glucose, UA NEGATIVE NEGATIVE mg/dL   Hgb urine  dipstick NEGATIVE NEGATIVE   Bilirubin Urine NEGATIVE NEGATIVE   Ketones, ur NEGATIVE NEGATIVE mg/dL   Protein, ur NEGATIVE NEGATIVE mg/dL   Nitrite NEGATIVE NEGATIVE   Leukocytes,Ua SMALL (A) NEGATIVE   RBC / HPF 0-5 0 - 5 RBC/hpf   WBC, UA 6-10 0 - 5 WBC/hpf   Bacteria, UA RARE (A) NONE SEEN   Squamous Epithelial / LPF 0-5 0 - 5   Mucus PRESENT   Amnisure rupture of membrane (rom)not at [facility]     Status: None  Collection Time: 02/08/19  1:18 AM  Result Value Ref Range   Amnisure ROM NEGATIVE    No results found.  MDM PE Labs: Amnisure, UA EFM  Assessment and Plan  23 year old G7P1051  SIUP at 32.5 weeks Cat I FT Vaginal Leaking Nausea  -Exam findings discussed. -Amnisure collected and sent. -NST reactive -Offered and accepts antiemetic; will give Zofran 4mg  ODT. -Will continue to monitor and reassess.   Maryann Conners MSN, CNM 02/08/2019, 12:46 AM   Reassessment (1:48 AM) Amnisure Negative  -Results discussed. -Patient encouraged to continue procardia as needed. -Reports no relief with zofran. Instructed to take Diclegis once home to help manage symptoms. -SO requests and given note for work.  -Encouraged to call or return to MAU if symptoms worsen or with the onset of new symptoms. -Discharged to home in stable condition.   Maryann Conners MSN, CNM

## 2019-02-09 ENCOUNTER — Other Ambulatory Visit: Payer: Self-pay

## 2019-02-09 ENCOUNTER — Ambulatory Visit (INDEPENDENT_AMBULATORY_CARE_PROVIDER_SITE_OTHER): Payer: Medicaid Other | Admitting: Obstetrics and Gynecology

## 2019-02-09 VITALS — BP 107/69 | HR 105 | Wt 107.0 lb

## 2019-02-09 DIAGNOSIS — D509 Iron deficiency anemia, unspecified: Secondary | ICD-10-CM

## 2019-02-09 DIAGNOSIS — Z331 Pregnant state, incidental: Secondary | ICD-10-CM

## 2019-02-09 DIAGNOSIS — Z3A32 32 weeks gestation of pregnancy: Secondary | ICD-10-CM

## 2019-02-09 DIAGNOSIS — Z1389 Encounter for screening for other disorder: Secondary | ICD-10-CM

## 2019-02-09 DIAGNOSIS — Z3483 Encounter for supervision of other normal pregnancy, third trimester: Secondary | ICD-10-CM

## 2019-02-09 LAB — POCT URINALYSIS DIPSTICK OB
Blood, UA: NEGATIVE
Glucose, UA: NEGATIVE
Ketones, UA: NEGATIVE
Leukocytes, UA: NEGATIVE
Nitrite, UA: NEGATIVE
POC,PROTEIN,UA: NEGATIVE

## 2019-02-09 NOTE — Progress Notes (Signed)
Patient ID: Michele Alexander, female   DOB: Jun 23, 1995, 23 y.o.   MRN: [id number]    LOW-RISK PREGNANCY VISIT Patient name: Michele Alexander MRN [id number]  Date of birth: [birth year] Chief Complaint:   Routine Prenatal Visit  History of Present Illness:   Michele Alexander is a 23 y.o. [id number] female at [redacted]w[redacted]d with an Estimated Date of Delivery: 03/31/19 being seen today for ongoing management of a low-risk pregnancy. Went to [facility] 02/08/2019, for contractions and pelvic pain believed water broke. Was treated for BV. Has no issues taking iron tablets. Seen 02/02/2019 for similar complaints. Was told to take iron tablets TID. Has had similar problem in previous pregnancy, was given feraheme  and potassium. Today she reports backache and pelvic pain. Contractions: Irregular. Vag. Bleeding: None.  Movement: Present. denies leaking of fluid. Review of Systems:   Pertinent items are noted in HPI Denies abnormal vaginal discharge w/ itching/odor/irritation, headaches, visual changes, shortness of breath, chest pain, abdominal pain, severe nausea/vomiting, or problems with urination or bowel movements unless otherwise stated above. Pertinent History Reviewed:  Reviewed past medical,surgical, social, obstetrical and family history.  Reviewed problem list, medications and allergies. Physical Assessment:   Vitals:   02/09/19 1023  BP: 107/69  Pulse: (!) 105  Weight: 107 lb (48.5 kg)  Body mass index is 21.61 kg/m.        Physical Examination:   General appearance: Well appearing, and in no distress  Mental status: Alert, oriented to person, place, and time  Skin: Warm & dry  Cardiovascular: Normal heart rate noted  Respiratory: Normal respiratory effort, no distress  Abdomen: Soft, gravid, nontender  Pelvic: Cervical exam performed   Cervix: multip, long, present parts outside of pelvis, closed          Extremities: Edema: None  Fetal Status: Fetal Heart Rate (bpm): 141   Movement: Present    Results  for orders placed or performed in visit on 02/09/19 (from the past 24 hour(s))  POC Urinalysis Dipstick OB   Collection Time: 02/09/19 10:22 AM  Result Value Ref Range   Color, UA     Clarity, UA     Glucose, UA Negative Negative   Bilirubin, UA     Ketones, UA neg    Spec Grav, UA     Blood, UA neg    pH, UA     POC,PROTEIN,UA Negative Negative, Trace, Small (1+), Moderate (2+), Large (3+), 4+   Urobilinogen, UA     Nitrite, UA neg    Leukocytes, UA Negative Negative   Appearance     Odor      Assessment & Plan:  1) Low-risk pregnancy [id number] at [redacted]w[redacted]d with an Estimated Date of Delivery: 03/31/19  2 Anemia, pt reports that she received Feraheme in [facility]. 2) Pelvic pressure, normal pregnancy fluid mucosa   Meds: No orders of the defined types were placed in this encounter.  Labs/procedures today: None  Plan:   1. Continue routine obstetrical care 2. Continue iron TID, with stool softner  3.CBC in 1 week  4. F/u 2 weeks in person  Reviewed: Preterm labor symptoms and general obstetric precautions including but not limited to vaginal bleeding, contractions, leaking of fluid and fetal movement were reviewed in detail with the patient.    Follow-up: Return in about 2 weeks (around 02/23/2019).  Orders Placed This Encounter  Procedures  . CBC  . POC Urinalysis Dipstick OB   By signing my name below, I, Samul Dada, attest that this documentation  has been prepared under the direction and in the presence of Jonnie Kind, MD. Electronically Signed: [place]. 02/09/19. 10:52 AM.  I personally performed the services described in this documentation, which was SCRIBED in my presence. The recorded information has been reviewed and considered accurate. It has been edited as necessary during review. Jonnie Kind, MD

## 2019-02-16 ENCOUNTER — Other Ambulatory Visit: Payer: Self-pay | Admitting: Obstetrics and Gynecology

## 2019-02-16 ENCOUNTER — Encounter: Payer: Medicaid Other | Admitting: Obstetrics and Gynecology

## 2019-02-17 ENCOUNTER — Ambulatory Visit (INDEPENDENT_AMBULATORY_CARE_PROVIDER_SITE_OTHER): Payer: Medicaid Other | Admitting: Advanced Practice Midwife

## 2019-02-17 ENCOUNTER — Other Ambulatory Visit: Payer: Self-pay

## 2019-02-17 VITALS — BP 121/77 | HR 114 | Wt 108.8 lb

## 2019-02-17 DIAGNOSIS — Z3A34 34 weeks gestation of pregnancy: Secondary | ICD-10-CM | POA: Diagnosis not present

## 2019-02-17 DIAGNOSIS — N898 Other specified noninflammatory disorders of vagina: Secondary | ICD-10-CM | POA: Diagnosis not present

## 2019-02-17 DIAGNOSIS — Z3483 Encounter for supervision of other normal pregnancy, third trimester: Secondary | ICD-10-CM

## 2019-02-17 DIAGNOSIS — O26893 Other specified pregnancy related conditions, third trimester: Secondary | ICD-10-CM

## 2019-02-17 DIAGNOSIS — Z0371 Encounter for suspected problem with amniotic cavity and membrane ruled out: Secondary | ICD-10-CM

## 2019-02-17 LAB — CBC
Hematocrit: 31.5 % — ABNORMAL LOW (ref 34.0–46.6)
Hemoglobin: 9.8 g/dL — ABNORMAL LOW (ref 11.1–15.9)
MCH: 23.3 pg — ABNORMAL LOW (ref 26.6–33.0)
MCHC: 31.1 g/dL — ABNORMAL LOW (ref 31.5–35.7)
MCV: 75 fL — ABNORMAL LOW (ref 79–97)
Platelets: 425 10*3/uL (ref 150–450)
RBC: 4.2 x10E6/uL (ref 3.77–5.28)
RDW: 23.7 % — ABNORMAL HIGH (ref 11.7–15.4)
WBC: 9.8 10*3/uL (ref 3.4–10.8)

## 2019-02-17 NOTE — Progress Notes (Signed)
   PRENATAL VISIT NOTE  Subjective:  Michele Alexander is a 23 y.o. [phone] at [redacted]w[redacted]d being seen today for one episode of leaking small amount of clear fluid this morning.  She is currently monitored for the following issues for this low-risk pregnancy and has History of Hodgkin's lymphoma; Sickle cell trait ([place]); Supervision of normal pregnancy; UTI (urinary tract infection) during pregnancy; Vaginal discharge during pregnancy; and Bacterial vaginosis on their problem list.  Patient reports occasional contractions and ? LOF .  Contractions: Irritability. Vag. Bleeding: None.  Movement: Present. Denies leaking of fluid.   The following portions of the patient's history were reviewed and updated as appropriate: allergies, current medications, past family history, past medical history, past social history, past surgical history and problem list.   Objective:   Vitals:   02/17/19 0921  BP: 121/77  Pulse: (!) 114  Weight: 108 lb 12.8 oz (49.4 kg)    Fetal Status: Fetal Heart Rate (bpm): 146   Movement: Present  Presentation: Vertex  General:  Alert, oriented and cooperative. Patient is in no acute distress.  Skin: Skin is warm and dry. No rash noted.   Cardiovascular: Normal heart rate noted  Respiratory: Normal respiratory effort, no problems with respiration noted  Abdomen: Soft, gravid, appropriate for gestational age.  Pain/Pressure: Present     Pelvic: Cervical exam performed Dilation: Fingertip Effacement (%): Thick Station: Ballotable. Neg pooling  Extremities: Normal range of motion.  Edema: None  Mental Status: Normal mood and affect. Normal behavior. Normal judgment and thought content.   Assessment and Plan:  Pregnancy: [id number] at [redacted]w[redacted]d 1. Encounter for supervision of other normal pregnancy in third trimester - F/U 1 wk   2. Vaginal discharge during pregnancy in third trimester  - POCT [place]  3. No leakage of amniotic fluid into vagina - Fern neg   Preterm labor  symptoms and general obstetric precautions including but not limited to vaginal bleeding, contractions, leaking of fluid and fetal movement were reviewed in detail with the patient. Please refer to After Visit Summary for other counseling recommendations.   No follow-ups on file.  Future Appointments  Date Time Provider [place]  02/23/2019  2:30 PM Chancy Milroy, MD [facility]    Manya Silvas, CNM

## 2019-02-17 NOTE — Patient Instructions (Signed)
Braxton Hicks Contractions Contractions of the uterus can occur throughout pregnancy, but they are not always a sign that you are in labor. You may have practice contractions called Braxton Hicks contractions. These false labor contractions are sometimes confused with true labor. What are Braxton Hicks contractions? Braxton Hicks contractions are tightening movements that occur in the muscles of the uterus before labor. Unlike true labor contractions, these contractions do not result in opening (dilation) and thinning of the cervix. Toward the end of pregnancy (32-34 weeks), Braxton Hicks contractions can happen more often and may become stronger. These contractions are sometimes difficult to tell apart from true labor because they can be very uncomfortable. You should not feel embarrassed if you go to the hospital with false labor. Sometimes, the only way to tell if you are in true labor is for your health care provider to look for changes in the cervix. The health care provider will do a physical exam and may monitor your contractions. If you are not in true labor, the exam should show that your cervix is not dilating and your water has not broken. If there are no other health problems associated with your pregnancy, it is completely safe for you to be sent home with false labor. You may continue to have Braxton Hicks contractions until you go into true labor. How to tell the difference between true labor and false labor True labor  Contractions last 30-70 seconds.  Contractions become very regular.  Discomfort is usually felt in the top of the uterus, and it spreads to the lower abdomen and low back.  Contractions do not go away with walking.  Contractions usually become more intense and increase in frequency.  The cervix dilates and gets thinner. False labor  Contractions are usually shorter and not as strong as true labor contractions.  Contractions are usually irregular.  Contractions  are often felt in the front of the lower abdomen and in the groin.  Contractions may go away when you walk around or change positions while lying down.  Contractions get weaker and are shorter-lasting as time goes on.  The cervix usually does not dilate or become thin. Follow these instructions at home:   Take over-the-counter and prescription medicines only as told by your health care provider.  Keep up with your usual exercises and follow other instructions from your health care provider.  Eat and drink lightly if you think you are going into labor.  If Braxton Hicks contractions are making you uncomfortable: ? Change your position from lying down or resting to walking, or change from walking to resting. ? Sit and rest in a tub of warm water. ? Drink enough fluid to keep your urine pale yellow. Dehydration may cause these contractions. ? Do slow and deep breathing several times an hour.  Keep all follow-up prenatal visits as told by your health care provider. This is important. Contact a health care provider if:  You have a fever.  You have continuous pain in your abdomen. Get help right away if:  Your contractions become stronger, more regular, and closer together.  You have fluid leaking or gushing from your vagina.  You pass blood-tinged mucus (bloody show).  You have bleeding from your vagina.  You have low back pain that you never had before.  You feel your baby's head pushing down and causing pelvic pressure.  Your baby is not moving inside you as much as it used to. Summary  Contractions that occur before labor are   called Braxton Hicks contractions, false labor, or practice contractions.  Braxton Hicks contractions are usually shorter, weaker, farther apart, and less regular than true labor contractions. True labor contractions usually become progressively stronger and regular, and they become more frequent.  Manage discomfort from Braxton Hicks contractions  by changing position, resting in a warm bath, drinking plenty of water, or practicing deep breathing. This information is not intended to replace advice given to you by your health care provider. Make sure you discuss any questions you have with your health care provider. Document Released: 10/09/2016 Document Revised: 05/08/2017 Document Reviewed: 10/09/2016 Elsevier Patient Education  2020 [employer]  

## 2019-02-21 ENCOUNTER — Telehealth: Payer: Self-pay | Admitting: *Deleted

## 2019-02-21 NOTE — Telephone Encounter (Signed)
Calling for hemoglobin results.

## 2019-02-23 ENCOUNTER — Other Ambulatory Visit: Payer: Self-pay

## 2019-02-23 ENCOUNTER — Ambulatory Visit (INDEPENDENT_AMBULATORY_CARE_PROVIDER_SITE_OTHER): Payer: Medicaid Other | Admitting: Obstetrics and Gynecology

## 2019-02-23 ENCOUNTER — Encounter: Payer: Self-pay | Admitting: Obstetrics and Gynecology

## 2019-02-23 VITALS — BP 115/74 | HR 105 | Wt 110.2 lb

## 2019-02-23 DIAGNOSIS — D649 Anemia, unspecified: Secondary | ICD-10-CM

## 2019-02-23 DIAGNOSIS — Z3A34 34 weeks gestation of pregnancy: Secondary | ICD-10-CM | POA: Diagnosis not present

## 2019-02-23 DIAGNOSIS — O99013 Anemia complicating pregnancy, third trimester: Secondary | ICD-10-CM

## 2019-02-23 DIAGNOSIS — Z331 Pregnant state, incidental: Secondary | ICD-10-CM

## 2019-02-23 DIAGNOSIS — Z3483 Encounter for supervision of other normal pregnancy, third trimester: Secondary | ICD-10-CM

## 2019-02-23 DIAGNOSIS — Z1389 Encounter for screening for other disorder: Secondary | ICD-10-CM

## 2019-02-23 LAB — POCT URINALYSIS DIPSTICK OB
Blood, UA: NEGATIVE
Glucose, UA: NEGATIVE
Ketones, UA: NEGATIVE
Nitrite, UA: NEGATIVE
POC,PROTEIN,UA: NEGATIVE

## 2019-02-23 LAB — POCT HEMOGLOBIN: Hemoglobin: 10.7 g/dL — AB (ref 11–14.6)

## 2019-02-23 NOTE — Progress Notes (Signed)
Subjective:  Michele Alexander is a 23 y.o. [phone] at [redacted]w[redacted]d being seen today for ongoing prenatal care.  She is currently monitored for the following issues for this low-risk pregnancy and has History of Hodgkin's lymphoma; Sickle cell trait ([place]); Supervision of normal pregnancy; UTI (urinary tract infection) during pregnancy; Vaginal discharge during pregnancy; and Bacterial vaginosis on their problem list.  Patient reports general discomforts of pregnancy.  Contractions: Not present. Vag. Bleeding: None.  Movement: Present. Denies leaking of fluid.   The following portions of the patient's history were reviewed and updated as appropriate: allergies, current medications, past family history, past medical history, past social history, past surgical history and problem list. Problem list updated.  Objective:   Vitals:   02/23/19 1442  BP: 115/74  Pulse: (!) 105  Weight: 110 lb 3.2 oz (50 kg)    Fetal Status:     Movement: Present     General:  Alert, oriented and cooperative. Patient is in no acute distress.  Skin: Skin is warm and dry. No rash noted.   Cardiovascular: Normal heart rate noted  Respiratory: Normal respiratory effort, no problems with respiration noted  Abdomen: Soft, gravid, appropriate for gestational age. Pain/Pressure: Present     Pelvic:  Cervical exam deferred        Extremities: Normal range of motion.  Edema: Trace  Mental Status: Normal mood and affect. Normal behavior. Normal judgment and thought content.   Urinalysis:      Assessment and Plan:  Pregnancy: [id number] at [redacted]w[redacted]d  1. Encounter for supervision of other normal pregnancy in third trimester Stable Transverse lie of exam today Reevaluate at next OB visit, Briefly discussed management. Pt declines ECV, prefers LTCS Living with sister in [place], considering transfer to [facility]   2. Screening for genitourinary condition  - POC Urinalysis Dipstick OB  3. Pregnant state, incidental  - POC Urinalysis  Dipstick OB  4. Anemia, unspecified type Continue with iron supplement - POCT hemoglobin  Preterm labor symptoms and general obstetric precautions including but not limited to vaginal bleeding, contractions, leaking of fluid and fetal movement were reviewed in detail with the patient. Please refer to After Visit Summary for other counseling recommendations.  Return in about 2 weeks (around 03/09/2019) for OB visit, face to face for GBS.   Chancy Milroy, MD

## 2019-02-23 NOTE — Patient Instructions (Signed)
Third Trimester of Pregnancy The third trimester is from week 28 through week 40 (months 7 through 9). The third trimester is a time when the unborn baby (fetus) is growing rapidly. At the end of the ninth month, the fetus is about 20 inches in length and weighs 6-10 pounds. Body changes during your third trimester Your body will continue to go through many changes during pregnancy. The changes vary from woman to woman. During the third trimester:  Your weight will continue to increase. You can expect to gain 25-35 pounds (11-16 kg) by the end of the pregnancy.  You may begin to get stretch marks on your hips, abdomen, and breasts.  You may urinate more often because the fetus is moving lower into your pelvis and pressing on your bladder.  You may develop or continue to have heartburn. This is caused by increased hormones that slow down muscles in the digestive tract.  You may develop or continue to have constipation because increased hormones slow digestion and cause the muscles that push waste through your intestines to relax.  You may develop hemorrhoids. These are swollen veins (varicose veins) in the rectum that can itch or be painful.  You may develop swollen, bulging veins (varicose veins) in your legs.  You may have increased body aches in the pelvis, back, or thighs. This is due to weight gain and increased hormones that are relaxing your joints.  You may have changes in your hair. These can include thickening of your hair, rapid growth, and changes in texture. Some women also have hair loss during or after pregnancy, or hair that feels dry or thin. Your hair will most likely return to normal after your baby is born.  Your breasts will continue to grow and they will continue to become tender. A yellow fluid (colostrum) may leak from your breasts. This is the first milk you are producing for your baby.  Your belly button may stick out.  You may notice more swelling in your hands,  face, or ankles.  You may have increased tingling or numbness in your hands, arms, and legs. The skin on your belly may also feel numb.  You may feel short of breath because of your expanding uterus.  You may have more problems sleeping. This can be caused by the size of your belly, increased need to urinate, and an increase in your body's metabolism.  You may notice the fetus "dropping," or moving lower in your abdomen (lightening).  You may have increased vaginal discharge.  You may notice your joints feel loose and you may have pain around your pelvic bone. What to expect at prenatal visits You will have prenatal exams every 2 weeks until week 36. Then you will have weekly prenatal exams. During a routine prenatal visit:  You will be weighed to make sure you and the baby are growing normally.  Your blood pressure will be taken.  Your abdomen will be measured to track your baby's growth.  The fetal heartbeat will be listened to.  Any test results from the previous visit will be discussed.  You may have a cervical check near your due date to see if your cervix has softened or thinned (effaced).  You will be tested for Group B streptococcus. This happens between 35 and 37 weeks. Your health care provider may ask you:  What your birth plan is.  How you are feeling.  If you are feeling the baby move.  If you have had any abnormal   symptoms, such as leaking fluid, bleeding, severe headaches, or abdominal cramping.  If you are using any tobacco products, including cigarettes, chewing tobacco, and electronic cigarettes.  If you have any questions. Other tests or screenings that may be performed during your third trimester include:  Blood tests that check for low iron levels (anemia).  Fetal testing to check the health, activity level, and growth of the fetus. Testing is done if you have certain medical conditions or if there are problems during the pregnancy.  Nonstress test  (NST). This test checks the health of your baby to make sure there are no signs of problems, such as the baby not getting enough oxygen. During this test, a belt is placed around your belly. The baby is made to move, and its heart rate is monitored during movement. What is false labor? False labor is a condition in which you feel small, irregular tightenings of the muscles in the womb (contractions) that usually go away with rest, changing position, or drinking water. These are called Braxton Hicks contractions. Contractions may last for hours, days, or even weeks before true labor sets in. If contractions come at regular intervals, become more frequent, increase in intensity, or become painful, you should see your health care provider. What are the signs of labor?  Abdominal cramps.  Regular contractions that start at 10 minutes apart and become stronger and more frequent with time.  Contractions that start on the top of the uterus and spread down to the lower abdomen and back.  Increased pelvic pressure and dull back pain.  A watery or bloody mucus discharge that comes from the vagina.  Leaking of amniotic fluid. This is also known as your "water breaking." It could be a slow trickle or a gush. Let your health care provider know if it has a color or strange odor. If you have any of these signs, call your health care provider right away, even if it is before your due date. Follow these instructions at home: Medicines  Follow your health care provider's instructions regarding medicine use. Specific medicines may be either safe or unsafe to take during pregnancy.  Take a prenatal vitamin that contains at least 600 micrograms (mcg) of folic acid.  If you develop constipation, try taking a stool softener if your health care provider approves. Eating and drinking   Eat a balanced diet that includes fresh fruits and vegetables, whole grains, good sources of protein such as meat, eggs, or tofu,  and low-fat dairy. Your health care provider will help you determine the amount of weight gain that is right for you.  Avoid raw meat and uncooked cheese. These carry germs that can cause birth defects in the baby.  If you have low calcium intake from food, talk to your health care provider about whether you should take a daily calcium supplement.  Eat four or five small meals rather than three large meals a day.  Limit foods that are high in fat and processed sugars, such as fried and sweet foods.  To prevent constipation: ? Drink enough fluid to keep your urine clear or pale yellow. ? Eat foods that are high in fiber, such as fresh fruits and vegetables, whole grains, and beans. Activity  Exercise only as directed by your health care provider. Most women can continue their usual exercise routine during pregnancy. Try to exercise for 30 minutes at least 5 days a week. Stop exercising if you experience uterine contractions.  Avoid heavy lifting.  Do   not exercise in extreme heat or humidity, or at high altitudes.  Wear low-heel, comfortable shoes.  Practice good posture.  You may continue to have sex unless your health care provider tells you otherwise. Relieving pain and discomfort  Take frequent breaks and rest with your legs elevated if you have leg cramps or low back pain.  Take warm sitz baths to soothe any pain or discomfort caused by hemorrhoids. Use hemorrhoid cream if your health care provider approves.  Wear a good support bra to prevent discomfort from breast tenderness.  If you develop varicose veins: ? Wear support pantyhose or compression stockings as told by your healthcare provider. ? Elevate your feet for 15 minutes, 3-4 times a day. Prenatal care  Write down your questions. Take them to your prenatal visits.  Keep all your prenatal visits as told by your health care provider. This is important. Safety  Wear your seat belt at all times when driving.  Make  a list of emergency phone numbers, including numbers for family, friends, the hospital, and police and fire departments. General instructions  Avoid cat litter boxes and soil used by cats. These carry germs that can cause birth defects in the baby. If you have a cat, ask someone to clean the litter box for you.  Do not travel far distances unless it is absolutely necessary and only with the approval of your health care provider.  Do not use hot tubs, steam rooms, or saunas.  Do not drink alcohol.  Do not use any products that contain nicotine or tobacco, such as cigarettes and e-cigarettes. If you need help quitting, ask your health care provider.  Do not use any medicinal herbs or unprescribed drugs. These chemicals affect the formation and growth of the baby.  Do not douche or use tampons or scented sanitary pads.  Do not cross your legs for long periods of time.  To prepare for the arrival of your baby: ? Take prenatal classes to understand, practice, and ask questions about labor and delivery. ? Make a trial run to the hospital. ? Visit the hospital and tour the maternity area. ? Arrange for maternity or paternity leave through employers. ? Arrange for family and friends to take care of pets while you are in the hospital. ? Purchase a rear-facing car seat and make sure you know how to install it in your car. ? Pack your hospital bag. ? Prepare the baby's nursery. Make sure to remove all pillows and stuffed animals from the baby's crib to prevent suffocation.  Visit your dentist if you have not gone during your pregnancy. Use a soft toothbrush to brush your teeth and be gentle when you floss. Contact a health care provider if:  You are unsure if you are in labor or if your water has broken.  You become dizzy.  You have mild pelvic cramps, pelvic pressure, or nagging pain in your abdominal area.  You have lower back pain.  You have persistent nausea, vomiting, or diarrhea.   You have an unusual or bad smelling vaginal discharge.  You have pain when you urinate. Get help right away if:  Your water breaks before 37 weeks.  You have regular contractions less than 5 minutes apart before 37 weeks.  You have a fever.  You are leaking fluid from your vagina.  You have spotting or bleeding from your vagina.  You have severe abdominal pain or cramping.  You have rapid weight loss or weight gain.  You have  shortness of breath with chest pain.  You notice sudden or extreme swelling of your face, hands, ankles, feet, or legs.  Your baby makes fewer than 10 movements in 2 hours.  You have severe headaches that do not go away when you take medicine.  You have vision changes. Summary  The third trimester is from week 28 through week 40, months 7 through 9. The third trimester is a time when the unborn baby (fetus) is growing rapidly.  During the third trimester, your discomfort may increase as you and your baby continue to gain weight. You may have abdominal, leg, and back pain, sleeping problems, and an increased need to urinate.  During the third trimester your breasts will keep growing and they will continue to become tender. A yellow fluid (colostrum) may leak from your breasts. This is the first milk you are producing for your baby.  False labor is a condition in which you feel small, irregular tightenings of the muscles in the womb (contractions) that eventually go away. These are called Braxton Hicks contractions. Contractions may last for hours, days, or even weeks before true labor sets in.  Signs of labor can include: abdominal cramps; regular contractions that start at 10 minutes apart and become stronger and more frequent with time; watery or bloody mucus discharge that comes from the vagina; increased pelvic pressure and dull back pain; and leaking of amniotic fluid. This information is not intended to replace advice given to you by your health  care provider. Make sure you discuss any questions you have with your health care provider. Document Released: 05/20/2001 Document Revised: 09/16/2018 Document Reviewed: 07/01/2016 Elsevier Patient Education  2020 [employer]  

## 2019-02-28 DIAGNOSIS — Z8659 Personal history of other mental and behavioral disorders: Secondary | ICD-10-CM | POA: Insufficient documentation

## 2019-03-09 ENCOUNTER — Encounter: Payer: Medicaid Other | Admitting: Obstetrics and Gynecology

## 2019-03-16 ENCOUNTER — Encounter: Payer: Self-pay | Admitting: *Deleted

## 2019-03-16 ENCOUNTER — Observation Stay
Admission: EM | Admit: 2019-03-16 | Discharge: 2019-03-16 | Disposition: A | Discharge status: Home / Self Care | Payer: Medicaid Other | Attending: Obstetrics and Gynecology | Admitting: Obstetrics and Gynecology

## 2019-03-16 ENCOUNTER — Other Ambulatory Visit: Payer: Self-pay

## 2019-03-16 DIAGNOSIS — Z3A38 38 weeks gestation of pregnancy: Secondary | ICD-10-CM

## 2019-03-16 DIAGNOSIS — O471 False labor at or after 37 completed weeks of gestation: Secondary | ICD-10-CM

## 2019-03-16 DIAGNOSIS — Z3483 Encounter for supervision of other normal pregnancy, third trimester: Secondary | ICD-10-CM

## 2019-03-16 LAB — RUPTURE OF MEMBRANE (ROM)PLUS: Rom Plus: NEGATIVE

## 2019-03-16 NOTE — OB Triage Note (Signed)
Patient comes in today for complaints of contractions that started last night and were about 30 minutes apart and are now 7 -24min apart and has had mucus like discharge. She rates them 8/10 pain. States baby is moving well and had a check up Monday and was 4 cm. Denies LOF, vaginal bleeding or any other.

## 2019-03-16 NOTE — Discharge Summary (Signed)
Physician Final Progress Note  Patient ID: Michele Alexander MRN: [id number] DOB/AGE: 22-07-1995 22 y.o.  Admit date: 03/16/2019 Admitting provider: Homero Fellers, MD Discharge date: 03/17/2019   Admission Diagnoses: Contractions  Discharge Diagnoses: Prodromal labor  History of Present Illness: The patient is a 23 y.o. female [phone] at [redacted]w[redacted]d who presents for contractions that started last night and were about half an hour apart; they increased in frequency and are now about 7-10 minutes apart. She rates the pain as 8/10. No vaginal bleeding or loss of fluid. Baby is moving well. Records review shows that she has been seen at [facility] in triage over the past few days and discharged with a cervical exam of 4 cm.  Review of Systems: Review of systems negative unless otherwise noted in HPI.   Past Medical History:  Diagnosis Date  . Anemia   . Anxiety   . Blood transfusion without reported diagnosis   . Cancer ([place])    Hodgkin's Lymphoma  . Depression   . Hodgkin's disease(201)   . Sickle cell trait ([place])    traits    Past Surgical History:  Procedure Laterality Date  . DILATION AND EVACUATION N/A 12/21/2015   Procedure: DILATATION AND EVACUATION;  Surgeon: Will Bonnet, MD;  Location: [facility] ORS;  Service: Gynecology;  Laterality: N/A;  . NECK LESION BIOPSY    . OTHER SURGICAL HISTORY N/A 2015   Endoscopy  . PORT-A-CATH REMOVAL    . PORTACATH PLACEMENT Left 2010    No current facility-administered medications on file prior to encounter.    Current Outpatient Medications on File Prior to Encounter  Medication Sig Dispense Refill  . Doxylamine-Pyridoxine (DICLEGIS) 10-10 MG TBEC 2 tabs q hs, if sx persist add 1 tab q am on day 3, if sx persist add 1 tab q afternoon on day 4 100 tablet 6  . ferrous sulfate 325 (65 FE) MG tablet Take 1 tablet (325 mg total) by mouth 3 (three) times daily with meals. 60 tablet 3  . Prenatal Vit-Fe Fumarate-FA (PRENATAL VITAMIN PO)  Take by mouth.    . cyclobenzaprine (FLEXERIL) 10 MG tablet Take 1 tablet (10 mg total) by mouth 2 (two) times daily as needed for muscle spasms. (Patient not taking: Reported on 03/16/2019) 20 tablet 0  . NIFEdipine (PROCARDIA) 10 MG capsule Take 1 capsule (10 mg total) by mouth every 6 (six) hours as needed (Contractions). (Patient not taking: Reported on 02/23/2019) 15 capsule 1    Allergies  Allergen Reactions  . Other Hives    Oral CT contrast Oral CT contrast  . Diatrizoate Hives    PO only.  Tolerates IV contrast.  . Iodinated Diagnostic Agents Hives  . Nexium [Esomeprazole Magnesium] Swelling  . Cheese Rash and Swelling    Lip swelling    Social History   Socioeconomic History  . Marital status: Married    Spouse name: Not on file  . Number of children: 1  . Years of education: Not on file  . Highest education level: Not on file  Occupational History  . Not on file  Social Needs  . Financial resource strain: Not on file  . Food insecurity    Worry: Not on file    Inability: Not on file  . Transportation needs    Medical: Not on file    Non-medical: Not on file  Tobacco Use  . Smoking status: Never Smoker  . Smokeless tobacco: Never Used  Substance and Sexual Activity  .  Alcohol use: No  . Drug use: No  . Sexual activity: Yes    Birth control/protection: None  Lifestyle  . Physical activity    Days per week: Not on file    Minutes per session: Not on file  . Stress: Not on file  Relationships  . Social Herbalist on phone: Not on file    Gets together: Not on file    Attends religious service: Not on file    Active member of club or organization: Not on file    Attends meetings of clubs or organizations: Not on file    Relationship status: Not on file  . Intimate partner violence    Fear of current or ex partner: Not on file    Emotionally abused: Not on file    Physically abused: Not on file    Forced sexual activity: Not on file  Other  Topics Concern  . Not on file  Social History Narrative  . Not on file    Family history:  Family History  Problem Relation Age of Onset  . Hypertension Mother   . Diabetes Maternal Grandfather   . Hypertension Maternal Grandfather   . Cancer Maternal Grandfather   . Cancer Maternal Grandmother   . Asthma Sister   . Asthma Daughter     Physical Exam: BP 121/73 (BP Location: Right Arm)   Pulse (!) 107   Temp 98.5 F (36.9 C) (Oral)   Resp 16   LMP 06/24/2018 (Exact Date)   Gen: NAD Abdomen: gravid, painful contractions Pulm: No increased work of breathing Pelvic: 4.5/60/0, unchanged over 2.5 hours after 3 exams Ext: No signs of DVT  NST Baseline: 135 bpm Variability: moderate Accelerations: present Decelerations: absent Tocometry: irregular, every 3-7 minutes The patient was monitored for 30+ minutes, fetal heart rate tracing was deemed reactive.  Hospital Course: Patient was admitted for contractions and made no cervical change over 2.5 hours with walking in the hallway. Declined morphine rest. When ready to discharge, she felt that she was leaking fluid. Ferning, pooling, and ROM Plus were negative. Patient was discharged home due to prodromal labor.  Procedures: SSE, NST  Discharge Condition: stable  Disposition: Discharge disposition: 01-Home or Self Care       Diet: Regular diet  Discharge Activity: Activity as tolerated   Allergies as of 03/16/2019      Reactions   Other Hives   Oral CT contrast Oral CT contrast   Diatrizoate Hives   PO only.  Tolerates IV contrast.   Iodinated Diagnostic Agents Hives   Nexium [esomeprazole Magnesium] Swelling   Cheese Rash, Swelling   Lip swelling      Medication List    ASK your doctor about these medications   cyclobenzaprine 10 MG tablet Commonly known as: FLEXERIL Take 1 tablet (10 mg total) by mouth 2 (two) times daily as needed for muscle spasms.   Doxylamine-Pyridoxine 10-10 MG Tbec Commonly  known as: Diclegis 2 tabs q hs, if sx persist add 1 tab q am on day 3, if sx persist add 1 tab q afternoon on day 4   ferrous sulfate 325 (65 FE) MG tablet Take 1 tablet (325 mg total) by mouth 3 (three) times daily with meals.   NIFEdipine 10 MG capsule Commonly known as: PROCARDIA Take 1 capsule (10 mg total) by mouth every 6 (six) hours as needed (Contractions).   PRENATAL VITAMIN PO Take by mouth.  Signed: Rexene Agent, CNM  03/17/2019, 3:24 AM

## 2019-03-16 NOTE — Discharge Instructions (Signed)

## 2019-03-19 ENCOUNTER — Other Ambulatory Visit: Payer: Self-pay

## 2019-03-19 ENCOUNTER — Observation Stay
Admission: EM | Admit: 2019-03-19 | Discharge: 2019-03-19 | Disposition: A | Discharge status: Home / Self Care | Payer: Medicaid Other | Attending: Obstetrics and Gynecology | Admitting: Obstetrics and Gynecology

## 2019-03-19 DIAGNOSIS — O471 False labor at or after 37 completed weeks of gestation: Principal | ICD-10-CM | POA: Insufficient documentation

## 2019-03-19 DIAGNOSIS — Z888 Allergy status to other drugs, medicaments and biological substances status: Secondary | ICD-10-CM | POA: Insufficient documentation

## 2019-03-19 DIAGNOSIS — Z3A38 38 weeks gestation of pregnancy: Secondary | ICD-10-CM | POA: Insufficient documentation

## 2019-03-19 DIAGNOSIS — Z3483 Encounter for supervision of other normal pregnancy, third trimester: Secondary | ICD-10-CM

## 2019-03-19 DIAGNOSIS — O479 False labor, unspecified: Secondary | ICD-10-CM | POA: Diagnosis present

## 2019-03-19 MED ORDER — ACETAMINOPHEN 325 MG PO TABS
650.0000 mg | ORAL_TABLET | ORAL | Status: DC | PRN
  Administered 2019-03-19: 650 mg via ORAL
  Filled 2019-03-19: qty 2

## 2019-03-19 NOTE — OB Triage Note (Signed)
Pt presents to the ED c/o ctx that are getting more intense. Pt is a [id number] [redacted]w[redacted]d. Pt denies LOF or vaginal bleeding, stating positive fetal movement. Pt states she has been having contractions since 1800 that have gotten progressively worse, rating them a 9/10. Denies recent sexual intercourse.External monitors applied and assessing. Initial FHT 140. VSS. SVE 4/60/0.

## 2019-03-19 NOTE — Discharge Summary (Signed)
Patient discharged home, discharge instructions given, patient states understanding. Patient left floor in stable condition, denies any other needs at this time. Patient to keep next scheduled OB appointment 

## 2019-03-19 NOTE — Discharge Instructions (Signed)
Call your provider for any concerns. Keep your next scheduled routine OB appointment on Tuesday.

## 2019-03-19 NOTE — Discharge Summary (Signed)
Physician Final Progress Note  Patient ID: Michele Alexander MRN: [id number] DOB/AGE: 15-Oct-1995 23 y.o.  Admit date: 03/19/2019 Admitting provider: Malachy Mood, MD Discharge date: 03/19/2019   Admission Diagnoses: Braxton Hicks contractions  Discharge Diagnoses:  Active Problems:   Braxton Hick's contraction  23 year old [id number] at [redacted]w[redacted]d presenting with contractions.  No LOF, no VB, +FM.  She was seen on 10/07/20020 with same chief complaint.  The patient was contracting regularly on initial presentation but her cervix was unchanged from prior check at 4cm. The patient was rechecked and 1 and 2 hrs following presentationwith no cervical change again noted.   Discharged home with diagnosis of braxton-hicks contractions, has follow in place at [facility]  Blood pressure 120/74, pulse (!) 120, temperature 98.8 F (37.1 C), temperature source Oral, resp. rate 16, height 4\' 11"  (1.499 m), weight 52.2 kg, last menstrual period 06/24/2018.  Consults: None  Significant Findings/ Diagnostic Studies: none  Procedures: Baseline: 125 Variability: moderate Accelerations: present Decelerations: absent Tocometry: every 86min The patient was monitored for 150 minutes, fetal heart rate tracing was deemed reactive, category I tracing,  Discharge Condition: good  Disposition: Discharge disposition: 01-Home or Self Care       Diet: Regular diet  Discharge Activity: Activity as tolerated  Discharge Instructions    Discharge activity:  No Restrictions   Complete by: As directed    Discharge diet:  No restrictions   Complete by: As directed    Fetal Kick Count:  Lie on our left side for one hour after a meal, and count the number of times your baby kicks.  If it is less than 5 times, get up, move around and drink some juice.  Repeat the test 30 minutes later.  If it is still less than 5 kicks in an hour, notify your doctor.   Complete by: As directed    LABOR:  When conractions begin, you  should start to time them from the beginning of one contraction to the beginning  of the next.  When contractions are 5 - 10 minutes apart or less and have been regular for at least an hour, you should call your health care provider.   Complete by: As directed    No sexual activity restrictions   Complete by: As directed    Notify physician for bleeding from the vagina   Complete by: As directed    Notify physician for blurring of vision or spots before the eyes   Complete by: As directed    Notify physician for chills or fever   Complete by: As directed    Notify physician for fainting spells, "black outs" or loss of consciousness   Complete by: As directed    Notify physician for increase in vaginal discharge   Complete by: As directed    Notify physician for leaking of fluid   Complete by: As directed    Notify physician for pain or burning when urinating   Complete by: As directed    Notify physician for pelvic pressure (sudden increase)   Complete by: As directed    Notify physician for severe or continued nausea or vomiting   Complete by: As directed    Notify physician for sudden gushing of fluid from the vagina (with or without continued leaking)   Complete by: As directed    Notify physician for sudden, constant, or occasional abdominal pain   Complete by: As directed    Notify physician if baby moving less than usual  Complete by: As directed      Allergies as of 03/19/2019      Reactions   Other Hives   Oral CT contrast Oral CT contrast   Diatrizoate Hives   PO only.  Tolerates IV contrast.   Iodinated Diagnostic Agents Hives   Nexium [esomeprazole Magnesium] Swelling   Cheese Rash, Swelling   Lip swelling      Medication List    STOP taking these medications   NIFEdipine 10 MG capsule Commonly known as: PROCARDIA     TAKE these medications   cyclobenzaprine 10 MG tablet Commonly known as: FLEXERIL Take 1 tablet (10 mg total) by mouth 2 (two) times  daily as needed for muscle spasms.   Doxylamine-Pyridoxine 10-10 MG Tbec Commonly known as: Diclegis 2 tabs q hs, if sx persist add 1 tab q am on day 3, if sx persist add 1 tab q afternoon on day 4   ferrous sulfate 325 (65 FE) MG tablet Take 1 tablet (325 mg total) by mouth 3 (three) times daily with meals.   PRENATAL VITAMIN PO Take by mouth.        Total time spent taking care of this patient: triaged remotely  Signed: Zorion Nims 03/19/2019, 7:01 AM

## 2019-03-25 ENCOUNTER — Inpatient Hospital Stay: Payer: Medicaid Other | Admitting: Anesthesiology

## 2019-03-25 ENCOUNTER — Inpatient Hospital Stay
Admission: EM | Admit: 2019-03-25 | Discharge: 2019-03-26 | DRG: 807 | Disposition: A | Discharge status: Home / Self Care | Payer: Medicaid Other | Attending: Obstetrics and Gynecology | Admitting: Obstetrics and Gynecology

## 2019-03-25 ENCOUNTER — Other Ambulatory Visit: Payer: Self-pay

## 2019-03-25 DIAGNOSIS — O4292 Full-term premature rupture of membranes, unspecified as to length of time between rupture and onset of labor: Secondary | ICD-10-CM | POA: Diagnosis present

## 2019-03-25 DIAGNOSIS — D573 Sickle-cell trait: Secondary | ICD-10-CM | POA: Diagnosis present

## 2019-03-25 DIAGNOSIS — Z8572 Personal history of non-Hodgkin lymphomas: Secondary | ICD-10-CM | POA: Diagnosis not present

## 2019-03-25 DIAGNOSIS — Z20828 Contact with and (suspected) exposure to other viral communicable diseases: Secondary | ICD-10-CM | POA: Diagnosis present

## 2019-03-25 DIAGNOSIS — O4202 Full-term premature rupture of membranes, onset of labor within 24 hours of rupture: Secondary | ICD-10-CM | POA: Diagnosis not present

## 2019-03-25 DIAGNOSIS — O4290 Premature rupture of membranes, unspecified as to length of time between rupture and onset of labor, unspecified weeks of gestation: Secondary | ICD-10-CM | POA: Diagnosis present

## 2019-03-25 DIAGNOSIS — Z3A39 39 weeks gestation of pregnancy: Secondary | ICD-10-CM | POA: Diagnosis not present

## 2019-03-25 DIAGNOSIS — O9902 Anemia complicating childbirth: Secondary | ICD-10-CM | POA: Diagnosis present

## 2019-03-25 DIAGNOSIS — Z3483 Encounter for supervision of other normal pregnancy, third trimester: Secondary | ICD-10-CM

## 2019-03-25 LAB — URINALYSIS, COMPLETE (UACMP) WITH MICROSCOPIC
Bilirubin Urine: NEGATIVE
Glucose, UA: NEGATIVE mg/dL
Hgb urine dipstick: NEGATIVE
Ketones, ur: NEGATIVE mg/dL
Nitrite: NEGATIVE
Protein, ur: NEGATIVE mg/dL
Specific Gravity, Urine: 1.005 (ref 1.005–1.030)
pH: 7 (ref 5.0–8.0)

## 2019-03-25 LAB — SARS CORONAVIRUS 2 BY RT PCR (HOSPITAL ORDER, PERFORMED IN ~~LOC~~ HOSPITAL LAB): SARS Coronavirus 2: NEGATIVE

## 2019-03-25 LAB — CHLAMYDIA/NGC RT PCR (ARMC ONLY)
Chlamydia Tr: NOT DETECTED
N gonorrhoeae: NOT DETECTED

## 2019-03-25 LAB — CBC
HCT: 33 % — ABNORMAL LOW (ref 36.0–46.0)
Hemoglobin: 10.3 g/dL — ABNORMAL LOW (ref 12.0–15.0)
MCH: 23 pg — ABNORMAL LOW (ref 26.0–34.0)
MCHC: 31.2 g/dL (ref 30.0–36.0)
MCV: 73.7 fL — ABNORMAL LOW (ref 80.0–100.0)
Platelets: 473 10*3/uL — ABNORMAL HIGH (ref 150–400)
RBC: 4.48 MIL/uL (ref 3.87–5.11)
RDW: 22.6 % — ABNORMAL HIGH (ref 11.5–15.5)
WBC: 10.4 10*3/uL (ref 4.0–10.5)
nRBC: 0 % (ref 0.0–0.2)

## 2019-03-25 LAB — TYPE AND SCREEN
ABO/RH(D): O POS
Antibody Screen: NEGATIVE

## 2019-03-25 LAB — RPR: RPR Ser Ql: NONREACTIVE

## 2019-03-25 LAB — RUPTURE OF MEMBRANE (ROM)PLUS: Rom Plus: POSITIVE

## 2019-03-25 MED ORDER — FERROUS SULFATE 325 (65 FE) MG PO TABS
325.0000 mg | ORAL_TABLET | Freq: Every day | ORAL | Status: DC
  Administered 2019-03-26: 325 mg via ORAL
  Filled 2019-03-25: qty 1

## 2019-03-25 MED ORDER — LIDOCAINE HCL (PF) 1 % IJ SOLN
INTRAMUSCULAR | Status: AC
  Filled 2019-03-25: qty 30

## 2019-03-25 MED ORDER — DIBUCAINE (PERIANAL) 1 % EX OINT: 1.0000 "application " | TOPICAL_OINTMENT | CUTANEOUS | Status: DC | PRN

## 2019-03-25 MED ORDER — SENNOSIDES-DOCUSATE SODIUM 8.6-50 MG PO TABS
2.0000 | ORAL_TABLET | ORAL | Status: DC
  Administered 2019-03-25: 2 via ORAL
  Filled 2019-03-25: qty 2

## 2019-03-25 MED ORDER — COCONUT OIL OIL
1.0000 "application " | TOPICAL_OIL | Status: DC | PRN
  Administered 2019-03-26: 1 via TOPICAL
  Filled 2019-03-25: qty 120

## 2019-03-25 MED ORDER — FENTANYL 2.5 MCG/ML W/ROPIVACAINE 0.15% IN NS 100 ML EPIDURAL (ARMC)
12.0000 mL/h | EPIDURAL | Status: DC
  Administered 2019-03-25: 12 mL/h via EPIDURAL

## 2019-03-25 MED ORDER — LIDOCAINE HCL (PF) 1 % IJ SOLN
INTRAMUSCULAR | Status: DC | PRN
  Administered 2019-03-25: 3 mL via INTRADERMAL

## 2019-03-25 MED ORDER — SIMETHICONE 80 MG PO CHEW: 80.0000 mg | CHEWABLE_TABLET | ORAL | Status: DC | PRN

## 2019-03-25 MED ORDER — OXYTOCIN 10 UNIT/ML IJ SOLN
INTRAMUSCULAR | Status: AC
  Filled 2019-03-25: qty 2

## 2019-03-25 MED ORDER — FENTANYL CITRATE (PF) 100 MCG/2ML IJ SOLN: 50.0000 ug | INTRAMUSCULAR | Status: DC | PRN

## 2019-03-25 MED ORDER — OXYCODONE HCL 5 MG PO TABS
5.0000 mg | ORAL_TABLET | ORAL | Status: DC | PRN
  Administered 2019-03-26: 5 mg via ORAL
  Filled 2019-03-25: qty 1

## 2019-03-25 MED ORDER — DIPHENHYDRAMINE HCL 50 MG/ML IJ SOLN: 12.5000 mg | INTRAMUSCULAR | Status: DC | PRN

## 2019-03-25 MED ORDER — ONDANSETRON HCL 4 MG/2ML IJ SOLN: 4.0000 mg | INTRAMUSCULAR | Status: DC | PRN

## 2019-03-25 MED ORDER — OXYTOCIN BOLUS FROM INFUSION
500.0000 mL | Freq: Once | INTRAVENOUS | Status: AC
  Administered 2019-03-25: 500 mL via INTRAVENOUS

## 2019-03-25 MED ORDER — OXYTOCIN 40 UNITS IN NORMAL SALINE INFUSION - SIMPLE MED
2.5000 [IU]/h | INTRAVENOUS | Status: DC
  Filled 2019-03-25: qty 1000

## 2019-03-25 MED ORDER — TERBUTALINE SULFATE 1 MG/ML IJ SOLN: 0.2500 mg | Freq: Once | INTRAMUSCULAR | Status: DC | PRN

## 2019-03-25 MED ORDER — PHENYLEPHRINE 40 MCG/ML (10ML) SYRINGE FOR IV PUSH (FOR BLOOD PRESSURE SUPPORT): 80.0000 ug | PREFILLED_SYRINGE | INTRAVENOUS | Status: DC | PRN

## 2019-03-25 MED ORDER — AMMONIA AROMATIC IN INHA
RESPIRATORY_TRACT | Status: AC
  Filled 2019-03-25: qty 10

## 2019-03-25 MED ORDER — ONDANSETRON HCL 4 MG/2ML IJ SOLN: 4.0000 mg | Freq: Four times a day (QID) | INTRAMUSCULAR | Status: DC | PRN

## 2019-03-25 MED ORDER — PRENATAL MULTIVITAMIN CH
1.0000 | ORAL_TABLET | Freq: Every day | ORAL | Status: DC
  Administered 2019-03-26: 1 via ORAL
  Filled 2019-03-25: qty 1

## 2019-03-25 MED ORDER — BUPIVACAINE HCL (PF) 0.25 % IJ SOLN
INTRAMUSCULAR | Status: DC | PRN
  Administered 2019-03-25: 3 mL via EPIDURAL
  Administered 2019-03-25: 4 mL via EPIDURAL

## 2019-03-25 MED ORDER — FENTANYL 2.5 MCG/ML W/ROPIVACAINE 0.15% IN NS 100 ML EPIDURAL (ARMC)
EPIDURAL | Status: AC
  Filled 2019-03-25: qty 100

## 2019-03-25 MED ORDER — MISOPROSTOL 200 MCG PO TABS
ORAL_TABLET | ORAL | Status: AC
  Filled 2019-03-25: qty 4

## 2019-03-25 MED ORDER — LIDOCAINE-EPINEPHRINE (PF) 1.5 %-1:200000 IJ SOLN
INTRAMUSCULAR | Status: DC | PRN
  Administered 2019-03-25: 3 mL via EPIDURAL

## 2019-03-25 MED ORDER — ONDANSETRON HCL 4 MG PO TABS: 4.0000 mg | ORAL_TABLET | ORAL | Status: DC | PRN

## 2019-03-25 MED ORDER — WITCH HAZEL-GLYCERIN EX PADS: 1.0000 "application " | MEDICATED_PAD | CUTANEOUS | Status: DC | PRN

## 2019-03-25 MED ORDER — LACTATED RINGERS IV SOLN
INTRAVENOUS | Status: DC
  Administered 2019-03-25 (×3): via INTRAVENOUS

## 2019-03-25 MED ORDER — LACTATED RINGERS IV SOLN
500.0000 mL | Freq: Once | INTRAVENOUS | Status: AC
  Administered 2019-03-25: 500 mL via INTRAVENOUS

## 2019-03-25 MED ORDER — EPHEDRINE 5 MG/ML INJ: 10.0000 mg | INTRAVENOUS | Status: DC | PRN

## 2019-03-25 MED ORDER — ACETAMINOPHEN 325 MG PO TABS: 650.0000 mg | ORAL_TABLET | ORAL | Status: DC | PRN

## 2019-03-25 MED ORDER — IBUPROFEN 600 MG PO TABS
600.0000 mg | ORAL_TABLET | Freq: Four times a day (QID) | ORAL | Status: DC
  Administered 2019-03-25 – 2019-03-26 (×5): 600 mg via ORAL
  Filled 2019-03-25 (×5): qty 1

## 2019-03-25 MED ORDER — BENZOCAINE-MENTHOL 20-0.5 % EX AERO: 1.0000 "application " | INHALATION_SPRAY | CUTANEOUS | Status: DC | PRN

## 2019-03-25 MED ORDER — OXYTOCIN 40 UNITS IN NORMAL SALINE INFUSION - SIMPLE MED
1.0000 m[IU]/min | INTRAVENOUS | Status: DC
  Administered 2019-03-25: 2 m[IU]/min via INTRAVENOUS

## 2019-03-25 MED ORDER — LIDOCAINE HCL (PF) 1 % IJ SOLN: 30.0000 mL | INTRAMUSCULAR | Status: DC | PRN

## 2019-03-25 MED ORDER — SOD CITRATE-CITRIC ACID 500-334 MG/5ML PO SOLN: 30.0000 mL | ORAL | Status: DC | PRN

## 2019-03-25 MED ORDER — LACTATED RINGERS IV SOLN: 500.0000 mL | INTRAVENOUS | Status: DC | PRN

## 2019-03-25 NOTE — Discharge Summary (Signed)
Physician Obstetric Discharge Summary  Patient ID: Michele Alexander MRN: [id number] DOB/AGE: [birth year] 22 y.o.   Date of Admission: 03/25/2019 Date of Delivery: 03/25/2019 Date of Discharge:  03/26/2019   Admitting Diagnosis: Premature rupture of membrane at [redacted]w[redacted]d  Secondary Diagnosis: none  Mode of Delivery: normal spontaneous vaginal delivery 03/25/2019      Discharge Diagnosis: Term intrauterine pregnancy delivered   Intrapartum Procedures: epidural and pitocin augmentation   Post partum procedures: none  Complications: none   Brief Hospital Course  Michele Alexander is a [id number] who had a SVD on 10/16 after a Pitocin augment;  for further details of this delivery, please refer to the delivery note.  Patient had an uncomplicated postpartum course.  By time of discharge on PPD#1, her pain was controlled on oral pain medications; she had appropriate lochia and was ambulating, voiding without difficulty and tolerating regular diet.  She was deemed stable for discharge to home.    Labs: CBC Latest Ref Rng & Units 03/26/2019 03/25/2019 02/23/2019  WBC 4.0 - 10.5 K/uL 14.3(H) 10.4 -  Hemoglobin 12.0 - 15.0 g/dL 9.6(L) 10.3(L) 10.7(A)  Hematocrit 36.0 - 46.0 % 30.7(L) 33.0(L) -  Platelets 150 - 400 K/uL 422(H) 473(H) -   O POS/ RI/ VI  Physical exam:  Blood pressure 115/85, pulse 81, temperature 97.9 F (36.6 C), temperature source Oral, resp. rate 18, height 4\' 11"  (1.499 m), weight 52.6 kg, last menstrual period 06/24/2018, SpO2 100 %, unknown if currently breastfeeding. General: alert and no distress Lochia: appropriate Abdomen: soft, NT, 2x3 cm umbilical hernia. nontender to palpation Uterine Fundus: firm  Extremities: No evidence of DVT seen on physical exam. No lower extremity edema.  Discharge Instructions: Per After Visit Summary. Activity: Advance as tolerated. Pelvic rest for 6 weeks.  Also refer to Discharge Instructions Diet: Regular Medications: Allergies as of  03/26/2019      Reactions   Other Hives   Oral CT contrast Oral CT contrast   Diatrizoate Hives   PO only.  Tolerates IV contrast.   Iodinated Diagnostic Agents Hives   Nexium [esomeprazole Magnesium] Swelling   Cheese Rash, Swelling   Lip swelling      Medication List    STOP taking these medications   cyclobenzaprine 10 MG tablet Commonly known as: FLEXERIL   Doxylamine-Pyridoxine 10-10 MG Tbec Commonly known as: Diclegis     TAKE these medications   ferrous sulfate 325 (65 FE) MG tablet Take 1 tablet (325 mg total) by mouth 3 (three) times daily with meals.   ibuprofen 600 MG tablet Commonly known as: ADVIL Take 1 tablet (600 mg total) by mouth every 6 (six) hours.   oxyCODONE 5 MG immediate release tablet Commonly known as: Oxy IR/ROXICODONE Take 1 tablet (5 mg total) by mouth every 6 (six) hours as needed for breakthrough pain.   PRENATAL VITAMIN PO Take by mouth.      Outpatient follow up:  Follow-up Information    Dalia Heading, CNM. Schedule an appointment as soon as possible for a visit.   Specialty: Certified Nurse Midwife Why: for 2 week follow up postpartum Contact information: [place] [address] [phone]          Postpartum contraception: IUD  Discharged Condition: stable  Discharged to: home   Newborn Data: Charlann Lange Disposition:home with mother  Apgars: APGAR (1 MIN): 8   APGAR (5 MINS): 9   APGAR (10 MINS):    Baby Feeding: Bottle and Breast  Prentice Docker, MD  03/26/2019 9:42 AM

## 2019-03-25 NOTE — Lactation Note (Signed)
This note was copied from a baby's chart. Lactation Consultation Note  Patient Name: Girl Shaneta Oleksa [id number] Date: 03/25/2019 Reason for consult: Initial assessment Mom had already breastfed baby after delivery and I did not observe a feeding, she states the baby breastfed well and latched easily, her first child only breastfed for 1 wk and her milk dried up, this baby is sucking on hands and showing feeding cues, I encouraged her to feed frequently as baby shows cues to promote increasing her milk supply  Maternal Data Does the patient have breastfeeding experience prior to this delivery?: Yes  Feeding Feeding Type: Breast Fed  LATCH Score Latch: (did not observe a feeding)                 Interventions    Lactation Tools Discussed/Used WIC Program: Yes   Consult Status Consult Status: Follow-up Date: 03/26/19 Follow-up type: In-patient    Ferol Luz 03/25/2019, 8:00 PM

## 2019-03-25 NOTE — Anesthesia Preprocedure Evaluation (Signed)
Anesthesia Evaluation  Patient identified by MRN, date of birth, ID band Patient awake    Reviewed: Allergy & Precautions, H&P , NPO status , Patient's Chart, lab work & pertinent test results  History of Anesthesia Complications Negative for: history of anesthetic complications  Airway Mallampati: III  TM Distance: >3 FB Neck ROM: full    Dental  (+) Dental Advidsory Given   Pulmonary neg pulmonary ROS,    Pulmonary exam normal        Cardiovascular negative cardio ROS       Neuro/Psych Anxiety Depression    GI/Hepatic Neg liver ROS, GERD  ,  Endo/Other  negative endocrine ROS  Renal/GU negative Renal ROS  negative genitourinary   Musculoskeletal   Abdominal   Peds  Hematology  (+) Blood dyscrasia, Sickle cell trait ,   Anesthesia Other Findings   Reproductive/Obstetrics (+) Pregnancy                             Anesthesia Physical Anesthesia Plan  ASA: II  Anesthesia Plan: Epidural   Post-op Pain Management:    Induction:   PONV Risk Score and Plan:   Airway Management Planned:   Additional Equipment:   Intra-op Plan:   Post-operative Plan:   Informed Consent: I have reviewed the patients History and Physical, chart, labs and discussed the procedure including the risks, benefits and alternatives for the proposed anesthesia with the patient or authorized representative who has indicated his/her understanding and acceptance.       Plan Discussed with: CRNA  Anesthesia Plan Comments:         Anesthesia Quick Evaluation

## 2019-03-25 NOTE — OB Triage Note (Signed)
Pt is a 23y/o at [redacted]w[redacted]d with c/o LOF since 9PM, pt states she felt a small gush and since has trickles down her leg when standing up. Pt states +FM. Pt denies CTX and VB. Pt states some back pain and pressure in her lower abdomen. Pt rates back pain an 8/10. Monitors applied and assessing. Initial FHT 155.

## 2019-03-25 NOTE — Progress Notes (Signed)
L&D Progress Note:   S: Contractions getting harder and stronger. Feeling pain in back with contractions  O: General: gravid WF breathing through contractions Vital signs: BP 107/73 (BP Location: Left Arm)   Pulse (!) 102   Temp 97.7 F (36.5 C) (Oral)   Resp 16   Ht 4\' 11"  (1.499 m)   Wt 52.6 kg   LMP 06/24/2018 (Exact Date)   BMI 23.43 kg/m    Abdomen: contractions palpate firm  FHR: 125 with accelerations to 150-160s, moderate variability, occasional variable deceleration Contractions: contractions every 3-5 minutes on 10 miu/min Pitocin  Cervix: 4cm/70%/-1 ' A: IUP at 39wk1 days now 11-12 hours s/p PROM  P: Continue Pitocin augmentation Continue to monitor progress and FWB. GBS negative.  Dalia Heading, CNM

## 2019-03-25 NOTE — Discharge Instructions (Signed)
°Vaginal Delivery, Care After °Refer to this sheet in the next few weeks. These discharge instructions provide you with information on caring for yourself after delivery. Your caregiver may also give you specific instructions. Your treatment has been planned according to the most current medical practices available, but problems sometimes occur. Call your caregiver if you have any problems or questions after you go home. °HOME CARE INSTRUCTIONS °1. Take over-the-counter or prescription medicines only as directed by your caregiver or pharmacist. °2. Do not drink alcohol, especially if you are breastfeeding or taking medicine to relieve pain. °3. Do not smoke tobacco. °4. Continue to use good perineal care. Good perineal care includes: °1. Wiping your perineum from back to front °2. Keeping your perineum clean. °3. You can do sitz baths twice a day, to help keep this area clean °5. Do not use tampons, douche or have sex for 6 weeks °6. Shower only and avoid sitting in submerged water, aside from sitz baths °7. Wear a well-fitting bra that provides breast support. °8. Eat healthy foods. °9. Drink enough fluids to keep your urine clear or pale yellow. °10. Eat high-fiber foods such as whole grain cereals and breads, brown rice, beans, and fresh fruits and vegetables every day. These foods may help prevent or relieve constipation. °11. Avoid constipation with high fiber foods or medications, such as miralax or metamucil °12. Follow your caregiver's recommendations regarding resumption of activities such as climbing stairs, driving, lifting, exercising, or traveling. °13. Talk to your caregiver about resuming sexual activities. Resumption of sexual activities after 6 weeks is dependent upon your risk of infection, your rate of healing, and your comfort and desire to resume sexual activity. °14. Try to have someone help you with your household activities and your newborn for at least a few days after you leave the  hospital. °15. Rest as much as possible. Try to rest or take a nap when your newborn is sleeping. °16. Increase your activities gradually. °17. Keep all of your scheduled postpartum appointments. It is very important to keep your scheduled follow-up appointments. At these appointments, your caregiver will be checking to make sure that you are healing physically and emotionally. °SEEK MEDICAL CARE IF:  °· You are passing large clots from your vagina. Save any clots to show your caregiver. °· You have a foul smelling discharge from your vagina. °· You have trouble urinating. °· You are urinating frequently. °· You have pain when you urinate. °· You have a change in your bowel movements. °· You have increasing redness, pain, or swelling near your vaginal incision (episiotomy) or vaginal tear. °· You have pus draining from your episiotomy or vaginal tear. °· Your episiotomy or vaginal tear is separating. °· You have painful, hard, or reddened breasts. °· You have a severe headache. °· You have blurred vision or see spots. °· You feel sad or depressed. °· You have thoughts of hurting yourself or your newborn. °· You have questions about your care, the care of your newborn, or medicines. °· You are dizzy or light-headed. °· You have a rash. °· You have nausea or vomiting. °· You were breastfeeding and have not had a menstrual period within 12 weeks after you stopped breastfeeding. °· You are not breastfeeding and have not had a menstrual period by the 12th week after delivery. °· You have a fever of 100.5 or more °SEEK IMMEDIATE MEDICAL CARE IF:  °· You have persistent pain. °· You have chest pain. °· You have shortness   of breath. °· You faint. °· You have leg pain. °· You have stomach pain. °· Your vaginal bleeding saturates two or more sanitary pads in 1 hour. °MAKE SURE YOU:  °· Understand these instructions. °· Will watch your condition. °· Will get help right away if you are not doing well or get worse. °Document  Released: 05/23/2000 Document Revised: 10/10/2013 Document Reviewed: 01/21/2012 °[employer]® Patient Information ©2015 [employer]. This information is not intended to replace advice given to you by your health care provider. Make sure you discuss any questions you have with your health care provider. ° °Sitz Bath °A sitz bath is a warm water bath taken in the sitting position. The water covers only the hips and butt (buttocks). We recommend using one that fits in the toilet, to help with ease of use and cleanliness. It may be used for either healing or cleaning purposes. Sitz baths are also used to relieve pain, itching, or muscle tightening (spasms). The water may contain medicine. Moist heat will help you heal and relax.  °HOME CARE  °Take 3 to 4 sitz baths a day. °18. Fill the bathtub half-full with warm water. °19. Sit in the water and open the drain a little. °20. Turn on the warm water to keep the tub half-full. Keep the water running constantly. °21. Soak in the water for 15 to 20 minutes. °22. After the sitz bath, pat the affected area dry. °GET HELP RIGHT AWAY IF: °You get worse instead of better. Stop the sitz baths if you get worse. °MAKE SURE YOU: °· Understand these instructions. °· Will watch your condition. °· Will get help right away if you are not doing well or get worse. °Document Released: 07/03/2004 Document Revised: 02/18/2012 Document Reviewed: 09/23/2010 °[employer]® Patient Information ©2015 [employer]. This information is not intended to replace advice given to you by your health care provider. Make sure you discuss any questions you have with your health care provider. ° ° °

## 2019-03-25 NOTE — Progress Notes (Signed)
Subjective:  Comofrtable  Objective:   Vitals: Blood pressure 120/87, pulse (!) 115, temperature 98.5 F (36.9 C), temperature source Oral, resp. rate 16, height 4\' 11"  (1.499 m), weight 52.6 kg, last menstrual period 06/24/2018. General: NAD Abdomen:soft, non-tender Cervical Exam:  Dilation: 3.5 Exam by:: Sheriff Rodenberg MD  FHT: 125, moderate, +accles, no decels Toco: irregular q 5-51min  Results for orders placed or performed during the hospital encounter of 03/25/19 (from the past 24 hour(s))  ROM Plus ([facility] only)     Status: None   Collection Time: 03/25/19 12:37 AM  Result Value Ref Range   Rom Plus POSITIVE   Urinalysis, Complete w Microscopic     Status: Abnormal   Collection Time: 03/25/19 12:37 AM  Result Value Ref Range   Color, Urine YELLOW (A) YELLOW   APPearance CLEAR (A) CLEAR   Specific Gravity, Urine 1.005 1.005 - 1.030   pH 7.0 5.0 - 8.0   Glucose, UA NEGATIVE NEGATIVE mg/dL   Hgb urine dipstick NEGATIVE NEGATIVE   Bilirubin Urine NEGATIVE NEGATIVE   Ketones, ur NEGATIVE NEGATIVE mg/dL   Protein, ur NEGATIVE NEGATIVE mg/dL   Nitrite NEGATIVE NEGATIVE   Leukocytes,Ua TRACE (A) NEGATIVE   WBC, UA 6-10 0 - 5 WBC/hpf   Bacteria, UA RARE (A) NONE SEEN   Squamous Epithelial / LPF 0-5 0 - 5  Chlamydia/NGC rt PCR ([facility] only)     Status: None   Collection Time: 03/25/19 12:37 AM   Specimen: Urine  Result Value Ref Range   Specimen source GC/Chlam URINE, RANDOM    Chlamydia Tr NOT DETECTED NOT DETECTED   N gonorrhoeae NOT DETECTED NOT DETECTED  SARS Coronavirus 2 by RT PCR (hospital order, performed in [place] hospital lab) Nasopharyngeal Nasopharyngeal Swab     Status: None   Collection Time: 03/25/19  1:21 AM   Specimen: Nasopharyngeal Swab  Result Value Ref Range   SARS Coronavirus 2 NEGATIVE NEGATIVE  CBC     Status: Abnormal   Collection Time: 03/25/19  1:39 AM  Result Value Ref Range   WBC 10.4 4.0 - 10.5 K/uL   RBC 4.48 3.87 - 5.11 MIL/uL   Hemoglobin 10.3 (L) 12.0 - 15.0 g/dL   HCT 33.0 (L) 36.0 - 46.0 %   MCV 73.7 (L) 80.0 - 100.0 fL   MCH 23.0 (L) 26.0 - 34.0 pg   MCHC 31.2 30.0 - 36.0 g/dL   RDW 22.6 (H) 11.5 - 15.5 %   Platelets 473 (H) 150 - 400 K/uL   nRBC 0.0 0.0 - 0.2 %  Type and screen [facility]     Status: None (Preliminary result)   Collection Time: 03/25/19  1:39 AM  Result Value Ref Range   ABO/RH(D) PENDING    Antibody Screen PENDING    Sample Expiration      03/28/2019,2359 Performed at [facility], [address] [place], [place] [address]   Type and screen     Status: None   Collection Time: 03/25/19  2:33 AM  Result Value Ref Range   ABO/RH(D) O POS    Antibody Screen NEG    Sample Expiration      03/28/2019,2359 Performed at [facility], [address] [place], [place] [address]     Assessment:   23 y.o. [phone] [redacted]w[redacted]d prelabor rupture of membranes  Plan:   1) Labor - no cervical change, no forebag on exam, start pitocin  2) Fetus - cat I  Conan Bowens  Georgianne Fick, MD, [facility], [facility] 03/25/2019, 4:26 AM

## 2019-03-25 NOTE — H&P (Addendum)
Obstetric H&P   Chief Complaint: Leaking fluid  Prenatal Care Provider: [facility]  History of Present Illness: 23 y.o. [id number] [redacted]w[redacted]d by 03/31/2019, by Last Menstrual Period= 8 week [place]  presenting to L&D with small gush of fluid and continued leaking since.  +CTX, no VB, +FM.  Multiple triage visits in the last few weeks here and at [facility] for discomforts of pregnancy and braxton-hicks contractions, 3-4 cm at that time. ROM plus positive   Prior pregnancy uncomplicated, [id number] vaginal delivery.  Past medical history notable for Hgb S trait, and non-hodgkin's lymphoma.  Pregravid weight 37.6 kg Total Weight Gain 15 kg  edd 03/31/2019 Problems (from 08/17/18 to present)    Problem Noted Resolved   Supervision of normal pregnancy 08/31/2018 by Octaviano Glow, RN No   Overview Addendum 12/29/2018 11:01 AM by Roma Schanz, CNM      FAMILY TREE  LAB RESULTS  Language English Pap 08/31/18: neg  Initiated care at [facility] GC/CT Initial:  -/-          36wks:  Dating by LMP c/w 8wk U/S    Support person [place] NT/IT: neg    AFP:      MaterniT21: normal female    [place]/HgbE Hgb S trait  Flu vaccine Declined 3/24 CF neg  TDaP vaccine 12/28/18  SMA neg  Rhogam N/A Fragile X neg    Varicella Immune 02/28/19 [facility]  Anatomy US Normal female 'Michele Alexander' Blood Type O/Positive/-- (03/24 1603)  Feeding Plan breast Antibody Negative (03/24 1603)  Contraception POPs HBsAg Negative (03/24 1603)  Circumcision n/a RPR Non Reactive (03/24 1603)  Pediatrician [facility] Rubella  1.01 (03/24 1603)  Prenatal Classes declines HIV Non Reactive (03/24 1603)    GTT/A1C 26-28wks:  77/90/97  BTL Consent n/a GBS Negative at [facility] 02/22/2019  VBAC Consent n/a    Doren Custard [ ] Class [ ] Consent [ ] CNM visit PP Needs                Review of Systems: 10 point review of systems negative unless otherwise noted in HPI  Past Medical History: Past Medical History:  Diagnosis Date  . Anemia   . Anxiety   . Blood  transfusion without reported diagnosis   . Cancer ([place])    Hodgkin's Lymphoma  . Depression   . Hodgkin's disease(201)   . Sickle cell trait ([place])    traits    Past Surgical History: Past Surgical History:  Procedure Laterality Date  . DILATION AND EVACUATION N/A 12/21/2015   Procedure: DILATATION AND EVACUATION;  Surgeon: Will Bonnet, MD;  Location: [facility] ORS;  Service: Gynecology;  Laterality: N/A;  . NECK LESION BIOPSY    . OTHER SURGICAL HISTORY N/A 2015   Endoscopy  . PORT-A-CATH REMOVAL    . PORTACATH PLACEMENT Left 2010    Past Obstetric History: # 1 - Date: 04/23/15, Sex: Female, Weight: 3380 g, GA: [redacted]w[redacted]d, Delivery: Vaginal, Spontaneous, Apgar1: 8, Apgar5: 9, Living: Living, Birth Comments: None  # 2 - Date: 12/21/15, Sex: None, Weight: None, GA: None, Delivery: None, Apgar1: None, Apgar5: None, Living: None, Birth Comments: None  # 3 - Date: 10/14/16, Sex: None, Weight: None, GA: [redacted]w[redacted]d, Delivery: None, Apgar1: None, Apgar5: None, Living: None, Birth Comments: None  # 4 - Date: 10/26/17, Sex: None, Weight: None, GA: None, Delivery: None, Apgar1: None, Apgar5: None, Living: None, Birth Comments: None  # 5 - Date: 12/11/17, Sex: None, Weight: None, GA: None, Delivery: None, Apgar1: None,  Apgar5: None, Living: None, Birth Comments: None  # 6 - Date: 02/2018, Sex: None, Weight: None, GA: None, Delivery: None, Apgar1: None, Apgar5: None, Living: None, Birth Comments: None  # 7 - Date: None, Sex: None, Weight: None, GA: None, Delivery: None, Apgar1: None, Apgar5: None, Living: None, Birth Comments: None   Family History: Family History  Problem Relation Age of Onset  . Hypertension Mother   . Diabetes Maternal Grandfather   . Hypertension Maternal Grandfather   . Cancer Maternal Grandfather   . Cancer Maternal Grandmother   . Asthma Sister   . Asthma Daughter     Social History: Social History   Socioeconomic History  . Marital status: Married    Spouse  name: Not on file  . Number of children: 1  . Years of education: Not on file  . Highest education level: Not on file  Occupational History  . Not on file  Social Needs  . Financial resource strain: Not on file  . Food insecurity    Worry: Not on file    Inability: Not on file  . Transportation needs    Medical: Not on file    Non-medical: Not on file  Tobacco Use  . Smoking status: Never Smoker  . Smokeless tobacco: Never Used  Substance and Sexual Activity  . Alcohol use: No  . Drug use: No  . Sexual activity: Yes    Birth control/protection: I.U.D.    Comment: morena  Lifestyle  . Physical activity    Days per week: Not on file    Minutes per session: Not on file  . Stress: Not on file  Relationships  . Social Herbalist on phone: Not on file    Gets together: Not on file    Attends religious service: Not on file    Active member of club or organization: Not on file    Attends meetings of clubs or organizations: Not on file    Relationship status: Not on file  . Intimate partner violence    Fear of current or ex partner: Not on file    Emotionally abused: Not on file    Physically abused: Not on file    Forced sexual activity: Not on file  Other Topics Concern  . Not on file  Social History Narrative  . Not on file    Medications: Prior to Admission medications   Medication Sig Start Date End Date Taking? Authorizing Provider  ferrous sulfate 325 (65 FE) MG tablet Take 1 tablet (325 mg total) by mouth 3 (three) times daily with meals. 02/03/19  Yes Gavin Pound, CNM  Prenatal Vit-Fe Fumarate-FA (PRENATAL VITAMIN PO) Take by mouth.   Yes [provider]  cyclobenzaprine (FLEXERIL) 10 MG tablet Take 1 tablet (10 mg total) by mouth 2 (two) times daily as needed for muscle spasms. Patient not taking: Reported on 03/16/2019 12/11/18   Darlina Rumpf, CNM  Doxylamine-Pyridoxine (DICLEGIS) 10-10 MG TBEC 2 tabs q hs, if sx persist add 1 tab q am  on day 3, if sx persist add 1 tab q afternoon on day 4 Patient not taking: Reported on 03/25/2019 08/31/18   Roma Schanz, CNM    Allergies: Allergies  Allergen Reactions  . Other Hives    Oral CT contrast Oral CT contrast  . Diatrizoate Hives    PO only.  Tolerates IV contrast.  . Iodinated Diagnostic Agents Hives  . Nexium [Esomeprazole Magnesium] Swelling  . Cheese  Rash and Swelling    Lip swelling    Physical Exam: Vitals: Blood pressure 120/87, pulse (!) 115, temperature 98.5 F (36.9 C), temperature source Oral, resp. rate 16, height 4\' 11"  (1.499 m), weight 52.6 kg, last menstrual period 06/24/2018.   FHT: 130, moderate, +accels, no decels Toco: q2-6min  General: NAD HEENT: normocephalic, anicteric Pulmonary: No increased work of breathing Cardiovascular: RRR, distal pulses 2+ Abdomen: Gravid, non-tender Genitourinary:     Extremities: no edema, erythema, or tenderness Neurologic: Grossly intact Psychiatric: mood appropriate, affect full  Labs: Results for orders placed or performed during the hospital encounter of 03/25/19 (from the past 24 hour(s))  ROM Plus ([facility] only)     Status: None   Collection Time: 03/25/19 12:37 AM  Result Value Ref Range   Rom Plus POSITIVE   Urinalysis, Complete w Microscopic     Status: Abnormal   Collection Time: 03/25/19 12:37 AM  Result Value Ref Range   Color, Urine YELLOW (A) YELLOW   APPearance CLEAR (A) CLEAR   Specific Gravity, Urine 1.005 1.005 - 1.030   pH 7.0 5.0 - 8.0   Glucose, UA NEGATIVE NEGATIVE mg/dL   Hgb urine dipstick NEGATIVE NEGATIVE   Bilirubin Urine NEGATIVE NEGATIVE   Ketones, ur NEGATIVE NEGATIVE mg/dL   Protein, ur NEGATIVE NEGATIVE mg/dL   Nitrite NEGATIVE NEGATIVE   Leukocytes,Ua TRACE (A) NEGATIVE   WBC, UA 6-10 0 - 5 WBC/hpf   Bacteria, UA RARE (A) NONE SEEN   Squamous Epithelial / LPF 0-5 0 - 5    Assessment: 23 y.o. [id number] [redacted]w[redacted]d by 03/31/2019, by Last Menstrual Period presenting  with rupture of membranes at term  Plan: 1) SROM - monitor for cervical chane.  GBS negative.  If fails to make cervical change on recheck start pitocin augmentation  2) Fetus - cat I tracing  3) PNL - Blood type O/Positive/-- (03/24 1603) / Anti-bodyscreen Negative (07/21 0918) / Rubella 1.01 (03/24 1603) / Varicella Immune / RPR Non Reactive (07/21 0918) / HBsAg Negative (03/24 1603) / HIV Non Reactive (07/21 [id number]) / 2-hr OGTT 77 / 90 /97 / GBS negative  4) Immunization History -  Immunization History  Administered Date(s) Administered  . Influenza,inj,Quad PF,6+ Mos 03/24/2013, 03/30/2014  . Tdap 12/28/2018    5) Disposition - pending delivery  Malachy Mood, MD, [place], [facility] 03/25/2019, 1:29 AM

## 2019-03-25 NOTE — Anesthesia Procedure Notes (Signed)
Epidural Patient location during procedure: OB  Staffing Anesthesiologist: Piscitello, Precious Haws, MD Resident/CRNA: Rolla Plate, CRNA Performed: resident/CRNA   Preanesthetic Checklist Completed: patient identified, site marked, surgical consent, pre-op evaluation, timeout performed, IV checked, risks and benefits discussed and monitors and equipment checked  Epidural Patient position: sitting Prep: ChloraPrep and site prepped and draped Patient monitoring: heart rate, continuous pulse ox and blood pressure Approach: midline Location: L4-L5 Injection technique: LOR saline  Needle:  Needle type: Tuohy  Needle gauge: 17 G Needle length: 9 cm and 9 Needle insertion depth: 6 cm Catheter type: closed end flexible Catheter size: 19 Gauge Catheter at skin depth: 11 cm Test dose: negative and 1.5% lidocaine with Epi 1:200 K  Assessment Events: blood not aspirated, injection not painful, no injection resistance, negative IV test and no paresthesia  Additional Notes   Patient tolerated the insertion well without complications.Reason for block:procedure for pain

## 2019-03-26 ENCOUNTER — Encounter: Payer: Self-pay | Admitting: Certified Nurse Midwife

## 2019-03-26 LAB — CBC
HCT: 30.7 % — ABNORMAL LOW (ref 36.0–46.0)
Hemoglobin: 9.6 g/dL — ABNORMAL LOW (ref 12.0–15.0)
MCH: 23.1 pg — ABNORMAL LOW (ref 26.0–34.0)
MCHC: 31.3 g/dL (ref 30.0–36.0)
MCV: 73.8 fL — ABNORMAL LOW (ref 80.0–100.0)
Platelets: 422 10*3/uL — ABNORMAL HIGH (ref 150–400)
RBC: 4.16 MIL/uL (ref 3.87–5.11)
RDW: 22.5 % — ABNORMAL HIGH (ref 11.5–15.5)
WBC: 14.3 10*3/uL — ABNORMAL HIGH (ref 4.0–10.5)
nRBC: 0 % (ref 0.0–0.2)

## 2019-03-26 MED ORDER — OXYCODONE HCL 5 MG PO TABS: 5.0000 mg | ORAL_TABLET | Freq: Four times a day (QID) | ORAL | 0 refills | Status: DC | PRN

## 2019-03-26 MED ORDER — IBUPROFEN 600 MG PO TABS: 600.0000 mg | ORAL_TABLET | Freq: Four times a day (QID) | ORAL | 0 refills | Status: DC

## 2019-03-26 NOTE — Progress Notes (Signed)
03/26/2019 8:08 PM  BP 113/84 (BP Location: Left Arm)   Pulse 79   Temp 98 F (36.7 C) (Oral)   Resp 18   Ht 4\' 11"  (149.9 cm)   Wt 52.6 kg   LMP 06/24/2018 (Exact Date)   SpO2 100%   Breastfeeding Unknown   BMI 23.43 kg/m  Patient discharged per MD orders. Discharge instructions reviewed with patient and patient verbalized understanding.  Prescriptions discussed and to be picked up by patient. Discharged via wheelchair escorted by nursing staff with infant.  Almedia Balls, RN

## 2019-03-26 NOTE — Care Management Note (Signed)
Case Management Note  Patient Details  Name: Michele Alexander MRN: [id number] Date of Birth: 1995-10-14  Subjective/Objective:                    Action/Plan:  RNCM met with mom privately at first. She she makes good eye contact and smiling during a brief meeting. She is nurturing and appears to be bonding well with her new baby which is in mom's arms. Baby is content. This is mom's second child. Her first was when she was 89 and she had post partum depression per patient/mom.  She denies depressed feeling since delivery. She states that during this pregnancy she did have "feeling of being depressed" and was offered Zoloft treatment but declined "due to her multiple miscarriages".  She denies feelings of harming anyone including herself. She will establish pediatrician with [facility] where her other child goes. She states her husband/baby's father supports her and she feels safe returning to home. She has a car seat present in room and transportation to appointments.  She states she is aware of how to get help and resources available if she starts feeling depression or hopeless. No other needs.  Expected Discharge Date:                  Expected Discharge Plan:  Home/Self Care  In-House Referral:     Discharge planning Services     Post Acute Care Choice:    Choice offered to:     DME Arranged:    DME Agency:     HH Arranged:    [place] Agency:     Status of Service:  Completed, signed off  If discussed at [employer] of Stay Meetings, dates discussed:    Additional Comments:  Marshell Garfinkel, RN 03/26/2019, 10:08 AM

## 2019-03-26 NOTE — Anesthesia Postprocedure Evaluation (Signed)
Anesthesia Post Note  Patient: Michele Alexander  Procedure(s) Performed: AN AD [place]  Patient location during evaluation: Mother Baby Anesthesia Type: Epidural Level of consciousness: awake and alert Pain management: pain level controlled Vital Signs Assessment: post-procedure vital signs reviewed and stable Respiratory status: spontaneous breathing, nonlabored ventilation and respiratory function stable Cardiovascular status: stable Postop Assessment: no headache, no backache and epidural receding Anesthetic complications: no     Last Vitals:  Vitals:   03/26/19 0408 03/26/19 0901  BP: 112/72 115/85  Pulse: 100 81  Resp: 14 18  Temp: 36.7 C 36.6 C  SpO2: 100% 100%    Last Pain:  Vitals:   03/26/19 1300  TempSrc:   PainSc: 0-No pain                 Martha Clan

## 2019-05-03 ENCOUNTER — Ambulatory Visit: Payer: Medicaid Other | Admitting: Certified Nurse Midwife

## 2019-05-09 ENCOUNTER — Other Ambulatory Visit: Payer: Self-pay

## 2019-05-09 DIAGNOSIS — Z8571 Personal history of Hodgkin lymphoma: Secondary | ICD-10-CM | POA: Insufficient documentation

## 2019-05-09 DIAGNOSIS — Z79899 Other long term (current) drug therapy: Secondary | ICD-10-CM | POA: Insufficient documentation

## 2019-05-09 DIAGNOSIS — R1032 Left lower quadrant pain: Secondary | ICD-10-CM | POA: Diagnosis not present

## 2019-05-10 ENCOUNTER — Encounter (HOSPITAL_COMMUNITY): Payer: Self-pay | Admitting: *Deleted

## 2019-05-10 ENCOUNTER — Other Ambulatory Visit: Payer: Self-pay

## 2019-05-10 ENCOUNTER — Emergency Department (HOSPITAL_COMMUNITY)
Admission: EM | Admit: 2019-05-10 | Discharge: 2019-05-10 | Disposition: A | Discharge status: Home / Self Care | Payer: Medicaid Other | Attending: Emergency Medicine | Admitting: Emergency Medicine

## 2019-05-10 DIAGNOSIS — R1032 Left lower quadrant pain: Secondary | ICD-10-CM

## 2019-05-10 LAB — URINALYSIS, ROUTINE W REFLEX MICROSCOPIC
Bilirubin Urine: NEGATIVE
Glucose, UA: NEGATIVE mg/dL
Hgb urine dipstick: NEGATIVE
Ketones, ur: NEGATIVE mg/dL
Leukocytes,Ua: NEGATIVE
Nitrite: NEGATIVE
Protein, ur: NEGATIVE mg/dL
Specific Gravity, Urine: 1.023 (ref 1.005–1.030)
pH: 6 (ref 5.0–8.0)

## 2019-05-10 LAB — BASIC METABOLIC PANEL
Anion gap: 7 (ref 5–15)
BUN: 9 mg/dL (ref 6–20)
CO2: 25 mmol/L (ref 22–32)
Calcium: 8.6 mg/dL — ABNORMAL LOW (ref 8.9–10.3)
Chloride: 107 mmol/L (ref 98–111)
Creatinine, Ser: 0.62 mg/dL (ref 0.44–1.00)
GFR calc Af Amer: >60 mL/min (ref >60)
GFR calc non Af Amer: >60 mL/min (ref >60)
Glucose, Bld: 98 mg/dL (ref 70–99)
Potassium: 3.4 mmol/L — ABNORMAL LOW (ref 3.5–5.1)
Sodium: 139 mmol/L (ref 135–145)

## 2019-05-10 LAB — CBC
HCT: 43.4 % (ref 36.0–46.0)
Hemoglobin: 13.2 g/dL (ref 12.0–15.0)
MCH: 24 pg — ABNORMAL LOW (ref 26.0–34.0)
MCHC: 30.4 g/dL (ref 30.0–36.0)
MCV: 78.9 fL — ABNORMAL LOW (ref 80.0–100.0)
Platelets: 354 10*3/uL (ref 150–400)
RBC: 5.5 MIL/uL — ABNORMAL HIGH (ref 3.87–5.11)
RDW: 17.7 % — ABNORMAL HIGH (ref 11.5–15.5)
WBC: 3.7 10*3/uL — ABNORMAL LOW (ref 4.0–10.5)
nRBC: 0 % (ref 0.0–0.2)

## 2019-05-10 LAB — PREGNANCY, URINE: Preg Test, Ur: NEGATIVE

## 2019-05-10 NOTE — ED Notes (Signed)
Pt ambulatory to waiting room. Pt verbalized understanding of discharge instructions.   

## 2019-05-10 NOTE — ED Triage Notes (Signed)
Pt c/o left lower abdominal pain x 2 days; pt states she took a pregnancy test and it came back positive

## 2019-05-10 NOTE — Discharge Instructions (Addendum)
It was our pleasure to provide your ER care today - we hope that you feel better.  Overall, your lab tests look good. Your pregnancy test is negative. Your potassium is slightly low (3.4) - eat plenty of fruits and vegetables.   For earlier pain, follow up with primary care doctor in the next 1-2 days if symptoms fail to improve/resolve.  Return to ER if worse, new symptoms, fevers, persistent vomiting, worsening or severe pain, or other concern.

## 2019-05-10 NOTE — ED Provider Notes (Signed)
[facility] EMERGENCY DEPARTMENT Provider Note   CSN: [id number] Arrival date & time: 05/09/19  2356     History   Chief Complaint Chief Complaint  Patient presents with  . Abdominal Pain    HPI Michele Alexander is a 23 y.o. female.     Patient c/o LLQ abdominal pain in the past 2 days. Symptoms acute onset, mild, intermittent, non radiating, dull, without specific exacerbating or alleviating factors. No hx same. No dysuria or hematuria. No vaginal discharge or bleeding. lnmp 2 weeks ago. States had normal vaginal delivery of 2nd child 1.5 months ago. No back or flank pain. +hx ovarian cyst, denies hx endometriosis. Normal appetite. No nv. Had normal bm today. No abd distension.   The history is provided by the patient.  Abdominal Pain Associated symptoms: no chest pain, no constipation, no cough, no diarrhea, no dysuria, no fever, no hematuria, no shortness of breath, no sore throat, no vaginal bleeding, no vaginal discharge and no vomiting     Past Medical History:  Diagnosis Date  . Anemia   . Anxiety   . Blood transfusion without reported diagnosis   . Cancer ([place])    Hodgkin's Lymphoma  . Depression   . Hodgkin's disease(201)   . Sickle cell trait ([place])    traits    Patient Active Problem List   Diagnosis Date Noted  . Premature rupture of membranes 03/25/2019  . Normal vaginal delivery 03/25/2019  . Postpartum care following vaginal delivery 03/25/2019  . Bacterial vaginosis 02/02/2019  . UTI (urinary tract infection) during pregnancy 01/10/2019  . Vaginal discharge during pregnancy 01/10/2019  . Supervision of normal pregnancy 08/31/2018  . History of Hodgkin's lymphoma 04/23/2015  . Sickle cell trait ([place]) 04/23/2015    Past Surgical History:  Procedure Laterality Date  . DILATION AND EVACUATION N/A 12/21/2015   Procedure: DILATATION AND EVACUATION;  Surgeon: Will Bonnet, MD;  Location: [facility] ORS;  Service: Gynecology;  Laterality: N/A;  . NECK  LESION BIOPSY    . OTHER SURGICAL HISTORY N/A 2015   Endoscopy  . PORT-A-CATH REMOVAL    . PORTACATH PLACEMENT Left 2010     OB History    Gravida  7   Para  2   Term  2   Preterm  0   AB  5   Living  2     SAB  5   TAB  0   Ectopic  0   Multiple  0   Live Births  2            Home Medications    Prior to Admission medications   Medication Sig Start Date End Date Taking? Authorizing Provider  ferrous sulfate 325 (65 FE) MG tablet Take 1 tablet (325 mg total) by mouth 3 (three) times daily with meals. 02/03/19   Gavin Pound, CNM  ibuprofen (ADVIL) 600 MG tablet Take 1 tablet (600 mg total) by mouth every 6 (six) hours. 03/26/19   Will Bonnet, MD  oxyCODONE (OXY IR/ROXICODONE) 5 MG immediate release tablet Take 1 tablet (5 mg total) by mouth every 6 (six) hours as needed for breakthrough pain. 03/26/19   Will Bonnet, MD  Prenatal Vit-Fe Fumarate-FA (PRENATAL VITAMIN PO) Take by mouth.    [provider]    Family History Family History  Problem Relation Age of Onset  . Hypertension Mother   . Diabetes Maternal Grandfather   . Hypertension Maternal Grandfather   . Cancer Maternal Grandfather   .  Cancer Maternal Grandmother   . Asthma Sister   . Asthma Daughter     Social History Social History   Tobacco Use  . Smoking status: Never Smoker  . Smokeless tobacco: Never Used  Substance Use Topics  . Alcohol use: No  . Drug use: No     Allergies   Other, Diatrizoate, Iodinated diagnostic agents, Nexium [esomeprazole magnesium], and Cheese   Review of Systems Review of Systems  Constitutional: Negative for fever.  HENT: Negative for sore throat.   Eyes: Negative for redness.  Respiratory: Negative for cough and shortness of breath.   Cardiovascular: Negative for chest pain.  Gastrointestinal: Positive for abdominal pain. Negative for constipation, diarrhea and vomiting.  Endocrine: Negative for polyuria.   Genitourinary: Negative for dysuria, flank pain, hematuria, vaginal bleeding and vaginal discharge.  Musculoskeletal: Negative for back pain.  Skin: Negative for rash.  Neurological: Negative for headaches.  Hematological: Does not bruise/bleed easily.  Psychiatric/Behavioral: Negative for confusion.     Physical Exam Updated Vital Signs BP 133/88 (BP Location: Left Arm)   Pulse 81   Temp 98.5 F (36.9 C) (Oral)   Resp 18   Ht 1.499 m (4\' 11" )   Wt 44 kg   LMP 05/01/2019   SpO2 100%   BMI 19.59 kg/m   Physical Exam Vitals signs and nursing note reviewed.  Constitutional:      Appearance: Normal appearance. She is well-developed.  HENT:     Head: Atraumatic.     Nose: Nose normal.     Mouth/Throat:     Mouth: Mucous membranes are moist.  Eyes:     General: No scleral icterus.    Conjunctiva/sclera: Conjunctivae normal.  Neck:     Musculoskeletal: Normal range of motion and neck supple. No neck rigidity or muscular tenderness.     Trachea: No tracheal deviation.  Cardiovascular:     Rate and Rhythm: Normal rate and regular rhythm.     Pulses: Normal pulses.     Heart sounds: Normal heart sounds. No murmur. No friction rub. No gallop.   Pulmonary:     Effort: Pulmonary effort is normal. No respiratory distress.     Breath sounds: Normal breath sounds.  Abdominal:     General: Bowel sounds are normal. There is no distension.     Palpations: Abdomen is soft. There is no mass.     Tenderness: There is no abdominal tenderness. There is no guarding or rebound.     Hernia: No hernia is present.  Genitourinary:    Comments: No cva tenderness.  Musculoskeletal:        General: No swelling.  Skin:    General: Skin is warm and dry.     Findings: No rash.  Neurological:     Mental Status: She is alert.     Comments: Alert, speech normal.   Psychiatric:        Mood and Affect: Mood normal.      ED Treatments / Results  Labs (all labs ordered are listed, but only  abnormal results are displayed) Results for orders placed or performed during the hospital encounter of 05/10/19  Urine pregnancy  Result Value Ref Range   Preg Test, Ur NEGATIVE NEGATIVE  UA  Result Value Ref Range   Color, Urine YELLOW YELLOW   APPearance CLEAR CLEAR   Specific Gravity, Urine 1.023 1.005 - 1.030   pH 6.0 5.0 - 8.0   Glucose, UA NEGATIVE NEGATIVE mg/dL   Hgb urine  dipstick NEGATIVE NEGATIVE   Bilirubin Urine NEGATIVE NEGATIVE   Ketones, ur NEGATIVE NEGATIVE mg/dL   Protein, ur NEGATIVE NEGATIVE mg/dL   Nitrite NEGATIVE NEGATIVE   Leukocytes,Ua NEGATIVE NEGATIVE  CBC  Result Value Ref Range   WBC 3.7 (L) 4.0 - 10.5 K/uL   RBC 5.50 (H) 3.87 - 5.11 MIL/uL   Hemoglobin 13.2 12.0 - 15.0 g/dL   HCT 43.4 36.0 - 46.0 %   MCV 78.9 (L) 80.0 - 100.0 fL   MCH 24.0 (L) 26.0 - 34.0 pg   MCHC 30.4 30.0 - 36.0 g/dL   RDW 17.7 (H) 11.5 - 15.5 %   Platelets 354 150 - 400 K/uL   nRBC 0.0 0.0 - 0.2 %  BMET  Result Value Ref Range   Sodium 139 135 - 145 mmol/L   Potassium 3.4 (L) 3.5 - 5.1 mmol/L   Chloride 107 98 - 111 mmol/L   CO2 25 22 - 32 mmol/L   Glucose, Bld 98 70 - 99 mg/dL   BUN 9 6 - 20 mg/dL   Creatinine, Ser 0.62 0.44 - 1.00 mg/dL   Calcium 8.6 (L) 8.9 - 10.3 mg/dL   GFR calc non Af Amer >60 >60 mL/min   GFR calc Af Amer >60 >60 mL/min   Anion gap 7 5 - 15    EKG None  Radiology No results found.  Procedures Procedures (including critical care time)  Medications Ordered in ED Medications - No data to display   Initial Impression / Assessment and Plan / ED Course  I have reviewed the triage vital signs and the nursing notes.  Pertinent labs & imaging results that were available during my care of the patient were reviewed by me and considered in my medical decision making (see chart for details).  Labs sent.  Reviewed nursing notes and prior charts for additional history.   Labs reviewed/interpreted by me - preg neg.   Vitals normal,  afebrile, abd soft nt, labs w preg neg, wbc not elev, ua neg hgb normal.  Patient currently appears comfortable, no apparent pain/discomfort and appears stable for d/c.   rec pcp f/u. Return precautions provided.     Final Clinical Impressions(s) / ED Diagnoses   Final diagnoses:  None    ED Discharge Orders    None       Lajean Saver, MD 05/10/19 [phone]

## 2019-05-25 ENCOUNTER — Other Ambulatory Visit: Payer: Self-pay

## 2019-05-25 ENCOUNTER — Encounter (HOSPITAL_COMMUNITY): Payer: Self-pay | Admitting: *Deleted

## 2019-05-25 ENCOUNTER — Emergency Department (HOSPITAL_COMMUNITY)
Admission: EM | Admit: 2019-05-25 | Discharge: 2019-05-26 | Disposition: A | Discharge status: Home / Self Care | Payer: Medicaid Other | Attending: Emergency Medicine | Admitting: Emergency Medicine

## 2019-05-25 DIAGNOSIS — Z8571 Personal history of Hodgkin lymphoma: Secondary | ICD-10-CM | POA: Diagnosis not present

## 2019-05-25 DIAGNOSIS — Z79899 Other long term (current) drug therapy: Secondary | ICD-10-CM | POA: Insufficient documentation

## 2019-05-25 DIAGNOSIS — R569 Unspecified convulsions: Secondary | ICD-10-CM | POA: Insufficient documentation

## 2019-05-25 DIAGNOSIS — R5383 Other fatigue: Secondary | ICD-10-CM | POA: Insufficient documentation

## 2019-05-25 NOTE — ED Triage Notes (Signed)
Pt states she had 2 seizures today and one tonight; pt states she feels nauseous and dizzy and weak and is c/o a headache; pt states the witness did not know how long the seizure lasted

## 2019-05-26 ENCOUNTER — Emergency Department (HOSPITAL_COMMUNITY): Payer: Medicaid Other

## 2019-05-26 LAB — BASIC METABOLIC PANEL
Anion gap: 9 (ref 5–15)
BUN: 9 mg/dL (ref 6–20)
CO2: 26 mmol/L (ref 22–32)
Calcium: 9.3 mg/dL (ref 8.9–10.3)
Chloride: 105 mmol/L (ref 98–111)
Creatinine, Ser: 0.65 mg/dL (ref 0.44–1.00)
GFR calc Af Amer: >60 mL/min (ref >60)
GFR calc non Af Amer: >60 mL/min (ref >60)
Glucose, Bld: 87 mg/dL (ref 70–99)
Potassium: 3.3 mmol/L — ABNORMAL LOW (ref 3.5–5.1)
Sodium: 140 mmol/L (ref 135–145)

## 2019-05-26 LAB — CBC WITH DIFFERENTIAL/PLATELET
Abs Immature Granulocytes: 0.01 10*3/uL (ref 0.00–0.07)
Basophils Absolute: 0 10*3/uL (ref 0.0–0.1)
Basophils Relative: 1 %
Eosinophils Absolute: 0.1 10*3/uL (ref 0.0–0.5)
Eosinophils Relative: 2 %
HCT: 43.8 % (ref 36.0–46.0)
Hemoglobin: 13.6 g/dL (ref 12.0–15.0)
Immature Granulocytes: 0 %
Lymphocytes Relative: 34 %
Lymphs Abs: 1.3 10*3/uL (ref 0.7–4.0)
MCH: 24.3 pg — ABNORMAL LOW (ref 26.0–34.0)
MCHC: 31.1 g/dL (ref 30.0–36.0)
MCV: 78.2 fL — ABNORMAL LOW (ref 80.0–100.0)
Monocytes Absolute: 0.2 10*3/uL (ref 0.1–1.0)
Monocytes Relative: 5 %
Neutro Abs: 2.3 10*3/uL (ref 1.7–7.7)
Neutrophils Relative %: 58 %
Platelets: 414 10*3/uL — ABNORMAL HIGH (ref 150–400)
RBC: 5.6 MIL/uL — ABNORMAL HIGH (ref 3.87–5.11)
RDW: 17.4 % — ABNORMAL HIGH (ref 11.5–15.5)
WBC: 4 10*3/uL (ref 4.0–10.5)
nRBC: 0 % (ref 0.0–0.2)

## 2019-05-26 LAB — I-STAT BETA HCG BLOOD, ED (MC, WL, AP ONLY): I-stat hCG, quantitative: <5 m[IU]/mL (ref <5)

## 2019-05-26 MED ORDER — METOCLOPRAMIDE HCL 5 MG/ML IJ SOLN
10.0000 mg | Freq: Once | INTRAMUSCULAR | Status: AC
  Administered 2019-05-26: 10 mg via INTRAVENOUS
  Filled 2019-05-26: qty 2

## 2019-05-26 MED ORDER — DIPHENHYDRAMINE HCL 50 MG/ML IJ SOLN
25.0000 mg | Freq: Once | INTRAMUSCULAR | Status: AC
  Administered 2019-05-26: 25 mg via INTRAVENOUS
  Filled 2019-05-26: qty 1

## 2019-05-26 MED ORDER — SODIUM CHLORIDE 0.9 % IV BOLUS (SEPSIS)
1000.0000 mL | Freq: Once | INTRAVENOUS | Status: AC
  Administered 2019-05-26: 1000 mL via INTRAVENOUS

## 2019-05-26 NOTE — Discharge Instructions (Addendum)
Please follow-up and call Dr. Merlene Laughter later today  You may also receive a phone call from [facility]  neurology in the next week.  If you are unable to see Dr. Simone Curia , please follow-up with [facility] neurology   Please be aware you may have another seizure  Do not drive until seen by your physician for your condition  Do not climb ladders/roofs/trees as a seizure can occur at that height and cause serious harm  Do not bathe/swim alone as a seizure can occur and cause serious harm  Please followup with your physician or neurologist for further testing and possible treatment

## 2019-05-26 NOTE — ED Provider Notes (Signed)
[facility] EMERGENCY DEPARTMENT Provider Note   CSN: [id number] Arrival date & time: 05/25/19  2338     History Chief Complaint  Patient presents with  . Seizures    Michele Alexander is a 23 y.o. female.  The history is provided by the patient.  Seizures Seizure activity on arrival: no   Seizure type:  Grand mal Postictal symptoms: confusion   Return to baseline: yes   Severity:  Moderate Progression:  Improving Recent head injury:  No recent head injuries History of seizures: no   Patient with history of anemia, anxiety, distant history of Hodgkin's lymphoma, recent pregnancy presents for seizures. She reports she had 2 seizures during the day and one just prior to arrival.  She has also reported feeling weak and dizzy.  She has also reporting headache.  She reports she has passed out previously and may be had seizure activity, but has never had a formal diagnosis or evaluation Now she reports headache and generalized weakness.  No visual changes.  No fevers or vomiting. Patient underwent a normal spontaneous vaginal delivery on October 16.  She is not breast-feeding    Past Medical History:  Diagnosis Date  . Anemia   . Anxiety   . Blood transfusion without reported diagnosis   . Cancer ([place])    Hodgkin's Lymphoma  . Depression   . Hodgkin's disease(201)   . Sickle cell trait ([place])    traits    Patient Active Problem List   Diagnosis Date Noted  . Premature rupture of membranes 03/25/2019  . Normal vaginal delivery 03/25/2019  . Postpartum care following vaginal delivery 03/25/2019  . Bacterial vaginosis 02/02/2019  . UTI (urinary tract infection) during pregnancy 01/10/2019  . Vaginal discharge during pregnancy 01/10/2019  . Supervision of normal pregnancy 08/31/2018  . History of Hodgkin's lymphoma 04/23/2015  . Sickle cell trait ([place]) 04/23/2015    Past Surgical History:  Procedure Laterality Date  . DILATION AND EVACUATION N/A 12/21/2015   Procedure:  DILATATION AND EVACUATION;  Surgeon: Will Bonnet, MD;  Location: [facility] ORS;  Service: Gynecology;  Laterality: N/A;  . NECK LESION BIOPSY    . OTHER SURGICAL HISTORY N/A 2015   Endoscopy  . PORT-A-CATH REMOVAL    . PORTACATH PLACEMENT Left 2010     OB History    Gravida  7   Para  2   Term  2   Preterm  0   AB  5   Living  2     SAB  5   TAB  0   Ectopic  0   Multiple  0   Live Births  2           Family History  Problem Relation Age of Onset  . Hypertension Mother   . Diabetes Maternal Grandfather   . Hypertension Maternal Grandfather   . Cancer Maternal Grandfather   . Cancer Maternal Grandmother   . Asthma Sister   . Asthma Daughter     Social History   Tobacco Use  . Smoking status: Never Smoker  . Smokeless tobacco: Never Used  Substance Use Topics  . Alcohol use: No  . Drug use: No    Home Medications Prior to Admission medications   Medication Sig Start Date End Date Taking? Authorizing Provider  ferrous sulfate 325 (65 FE) MG tablet Take 1 tablet (325 mg total) by mouth 3 (three) times daily with meals. 02/03/19   Gavin Pound, CNM  ibuprofen (ADVIL) 600  MG tablet Take 1 tablet (600 mg total) by mouth every 6 (six) hours. 03/26/19   Will Bonnet, MD  oxyCODONE (OXY IR/ROXICODONE) 5 MG immediate release tablet Take 1 tablet (5 mg total) by mouth every 6 (six) hours as needed for breakthrough pain. 03/26/19   Will Bonnet, MD  Prenatal Vit-Fe Fumarate-FA (PRENATAL VITAMIN PO) Take by mouth.    [provider]    Allergies    Other, Diatrizoate, Iodinated diagnostic agents, Nexium [esomeprazole magnesium], and Cheese  Review of Systems   Review of Systems  Constitutional: Positive for fatigue. Negative for fever.  Eyes: Negative for visual disturbance.  Cardiovascular: Negative for chest pain.  Gastrointestinal: Negative for abdominal pain.  Neurological: Positive for seizures.  All other systems reviewed and  are negative.   Physical Exam Updated Vital Signs BP (!) 153/100 (BP Location: Left Arm)   Pulse 73   Temp 97.8 F (36.6 C) (Oral)   Resp 20   Ht 1.499 m (4\' 11" )   Wt 42.2 kg   LMP 05/01/2019   SpO2 100%   BMI 18.78 kg/m   Physical Exam  CONSTITUTIONAL: Well developed/well nourished HEAD: Normocephalic/atraumatic, no signs of trauma EYES: EOMI/PERRL ENMT: Mucous membranes moist, no tongue laceration NECK: supple no meningeal signs SPINE/BACK:entire spine nontender CV: S1/S2 noted, no murmurs/rubs/gallops noted LUNGS: Lungs are clear to auscultation bilaterally, no apparent distress ABDOMEN: soft, nontender, no rebound or guarding, bowel sounds noted throughout abdomen GU:no cva tenderness NEURO: Pt is awake/alert/appropriate, moves all extremitiesx4.  No facial droop.  No arm or leg drift.  GCS 15.  Mental status appropriate EXTREMITIES: pulses normal/equal, full ROM, no visible trauma SKIN: warm, color normal PSYCH: no abnormalities of mood noted, alert and oriented to situation   .ED Results / Procedures / Treatments   Labs (all labs ordered are listed, but only abnormal results are displayed) Labs Reviewed  BASIC METABOLIC PANEL - Abnormal; Notable for the following components:      Result Value   Potassium 3.3 (*)    All other components within normal limits  CBC WITH DIFFERENTIAL/PLATELET - Abnormal; Notable for the following components:   RBC 5.60 (*)    MCV 78.2 (*)    MCH 24.3 (*)    RDW 17.4 (*)    Platelets 414 (*)    All other components within normal limits  I-STAT BETA HCG BLOOD, ED ([facility], [facility], [facility] ONLY)    EKG EKG Interpretation  Date/Time:  Thursday May 26 2019 00:09:42 EST Ventricular Rate:  74 PR Interval:    QRS Duration: 83 QT Interval:  392 QTC Calculation: 435 R Axis:   84 Text Interpretation: Sinus rhythm Baseline wander in lead(s) V5 Confirmed by Ripley Fraise [phone]) on 05/26/2019 12:34:21 AM   Radiology CT Head Wo  Contrast  Result Date: 05/26/2019 CLINICAL DATA:  Two seizures today. EXAM: CT HEAD WITHOUT CONTRAST TECHNIQUE: Contiguous axial images were obtained from the base of the skull through the vertex without intravenous contrast. COMPARISON:  None. FINDINGS: Brain: No intracranial hemorrhage, mass effect, or midline shift. No hydrocephalus. The basilar cisterns are patent. No evidence of territorial infarct or acute ischemia. No extra-axial or intracranial fluid collection. Vascular: No hyperdense vessel or unexpected calcification. Skull: Normal. Negative for fracture or focal lesion. Sinuses/Orbits: Paranasal sinuses and mastoid air cells are clear. The visualized orbits are unremarkable. Other: None. IMPRESSION: Unremarkable noncontrast head CT. Electronically Signed   By: Keith Rake M.D.   On: 05/26/2019 01:13  Procedures Procedures  Medications Ordered in ED Medications  metoCLOPramide (REGLAN) injection 10 mg (10 mg Intravenous Given 05/26/19 0038)  diphenhydrAMINE (BENADRYL) injection 25 mg (25 mg Intravenous Given 05/26/19 0039)  sodium chloride 0.9 % bolus 1,000 mL (0 mLs Intravenous Stopped 05/26/19 0111)    ED Course  I have reviewed the triage vital signs and the nursing notes.  Pertinent labs & imaging results that were available during my care of the patient were reviewed by me and considered in my medical decision making (see chart for details).    MDM Rules/Calculators/A&P                      12:47 AM Patient reported she had 3 seizures today.  Family is currently unavailable to provide additional history.  She does have a video of one of her seizures her husband sent to her via text.  She does appear to be having body jerking that could be consistent with seizure on the video. Of note, patient does have a distant history of Hodgkin's lymphoma over 10 years ago in remission She also reports recent NSVD on October 16, over 8 weeks ago CT head pending 2:17 AM Pt  improved D/w husband He confirms h/o 3 seizures in the past day No known previous h/o seizures Discussed at length seizure precautions with husband Also - need to call for neurology followup tomorrow 3:04 AM BP 114/65   Pulse 67   Temp 97.8 F (36.6 C) (Oral)   Resp (!) 22   Ht 1.499 m (4\' 11" )   Wt 42.2 kg   LMP 05/01/2019   SpO2 100%   BMI 18.78 kg/m  Patient feels improved.  Vitals improved.  She is taking p.o. fluids and walk around the ER She would like to be discharged home.  We discussed seizure precautions including no driving or bathing alone Refer to neurology Final Clinical Impression(s) / ED Diagnoses Final diagnoses:  Seizure [facility])    Rx / DC Orders ED Discharge Orders    None       Ripley Fraise, MD 05/26/19 [phone]

## 2019-05-26 NOTE — ED Notes (Signed)
Pt tolerating oral fluid intake. Ambulated around room with no assistance. Pt states she feels "much better" than when she came in.

## 2019-05-31 ENCOUNTER — Encounter: Payer: Self-pay | Admitting: Neurology

## 2019-06-13 NOTE — Progress Notes (Deleted)
Postpartum Visit  Chief Complaint: No chief complaint on file.   History of Present Illness: Michele Alexander is a 24 y.o. [phone] presents for postpartum visit.  Date of delivery: 03/25/2019 Type of delivery: Vaginal delivery - Vacuum or forceps assisted  no Episiotomy No.  Laceration: no  Pregnancy or labor problems:  {yes/no:63} Any problems since the delivery:  {yes/no:63}  Newborn Details:  SINGLETON :  1. Baby's name: ***. Birth weight: *** Maternal Details:  Breast Feeding:  {yes/no:63} Post partum depression/anxiety noted:  {yes/no:63} Edinburgh Post-Partum Depression Score:  {numbers [id number]  Date of last PAP: 08/31/2018  normal   Review of Systems: ROS  Past Medical History:  Past Medical History:  Diagnosis Date  . Anemia   . Anxiety   . Blood transfusion without reported diagnosis   . Cancer ([place])    Hodgkin's Lymphoma  . Depression   . Hodgkin's disease(201)   . Sickle cell trait ([place])    traits    Past Surgical History:  Past Surgical History:  Procedure Laterality Date  . DILATION AND EVACUATION N/A 12/21/2015   Procedure: DILATATION AND EVACUATION;  Surgeon: Will Bonnet, MD;  Location: [facility] ORS;  Service: Gynecology;  Laterality: N/A;  . NECK LESION BIOPSY    . OTHER SURGICAL HISTORY N/A 2015   Endoscopy  . PORT-A-CATH REMOVAL    . PORTACATH PLACEMENT Left 2010    Family History:  Family History  Problem Relation Age of Onset  . Hypertension Mother   . Diabetes Maternal Grandfather   . Hypertension Maternal Grandfather   . Cancer Maternal Grandfather   . Cancer Maternal Grandmother   . Asthma Sister   . Asthma Daughter     Social History:  Social History   Socioeconomic History  . Marital status: Married    Spouse name: Not on file  . Number of children: 1  . Years of education: Not on file  . Highest education level: Not on file  Occupational History  . Not on file  Tobacco Use  . Smoking status: Never Smoker    . Smokeless tobacco: Never Used  Substance and Sexual Activity  . Alcohol use: No  . Drug use: No  . Sexual activity: Yes    Birth control/protection: I.U.D.    Comment: morena  Other Topics Concern  . Not on file  Social History Narrative  . Not on file   Social Determinants of Health   Financial Resource Strain:   . Difficulty of Paying Living Expenses: Not on file  Food Insecurity:   . Worried About Charity fundraiser in the Last Year: Not on file  . Ran Out of Food in the Last Year: Not on file  Transportation Needs:   . Lack of Transportation (Medical): Not on file  . Lack of Transportation (Non-Medical): Not on file  Physical Activity:   . Days of Exercise per Week: Not on file  . Minutes of Exercise per Session: Not on file  Stress:   . Feeling of Stress : Not on file  Social Connections:   . Frequency of Communication with Friends and Family: Not on file  . Frequency of Social Gatherings with Friends and Family: Not on file  . Attends Religious Services: Not on file  . Active Member of Clubs or Organizations: Not on file  . Attends Archivist Meetings: Not on file  . Marital Status: Not on file  Intimate Partner Violence:   .  Fear of Current or Ex-Partner: Not on file  . Emotionally Abused: Not on file  . Physically Abused: Not on file  . Sexually Abused: Not on file    Allergies:  Allergies  Allergen Reactions  . Other Hives    Oral CT contrast Oral CT contrast  . Diatrizoate Hives    PO only.  Tolerates IV contrast.  . Iodinated Diagnostic Agents Hives  . Nexium [Esomeprazole Magnesium] Swelling  . Cheese Rash and Swelling    Lip swelling    Medications: Prior to Admission medications   Medication Sig Start Date End Date Taking? Authorizing Provider  ferrous sulfate 325 (65 FE) MG tablet Take 1 tablet (325 mg total) by mouth 3 (three) times daily with meals. 02/03/19   Gavin Pound, CNM  ibuprofen (ADVIL) 600 MG tablet Take 1 tablet  (600 mg total) by mouth every 6 (six) hours. 03/26/19   Will Bonnet, MD  oxyCODONE (OXY IR/ROXICODONE) 5 MG immediate release tablet Take 1 tablet (5 mg total) by mouth every 6 (six) hours as needed for breakthrough pain. 03/26/19   Will Bonnet, MD  Prenatal Vit-Fe Fumarate-FA (PRENATAL VITAMIN PO) Take by mouth.    [provider]    Physical Exam Vitals: There were no vitals filed for this visit.  General: NAD HEENT: normocephalic, anicteric Neck: No thyroid enlargement, no palpable nodules, no cervical lymphadenpathy Breast: Lactating, no inflammation, no masses, nipples intact Pulmonary: No increased work of breathing, CTAB Abdomen: Soft, non-tender, non-distended.  Umbilicus without lesions.  No hepatomegaly or masses palpable. No evidence of hernia. Genitourinary:  External: Well healed perineum, no lesions or inflammation    Vagina: Normal vaginal mucosa, no evidence of prolapse.    Cervix: Grossly normal in appearance, no bleeding  Uterus: Well involuted, mobile, non-tender  Adnexa: No adnexal masses, non-tender  Rectal: deferred Extremities: no edema, erythema, or tenderness Neurologic: Grossly intact Psychiatric: mood appropriate, affect full  Assessment: 24 y.o. [id number] presenting for 6 week postpartum visit  Plan: ***  1) Contraception Education given regarding options for contraception, including {contraceptive options (MU measure 33):20677}. Patient would like to use *** for contraception.  2)  Pap *** - ASCCP guidelines and rational discussed.  Patient opts for *** screening interval.  3) Patient underwent screening for postpartum depression with *** concerns noted.  4) Discussed return to normal activity, recommend continuing prenatal vitamins.  5) Follow up 1 year for routine annual exam.

## 2019-06-15 ENCOUNTER — Ambulatory Visit: Payer: Medicaid Other | Admitting: Certified Nurse Midwife

## 2019-06-24 ENCOUNTER — Encounter: Payer: Self-pay | Admitting: Neurology

## 2019-06-24 ENCOUNTER — Ambulatory Visit (INDEPENDENT_AMBULATORY_CARE_PROVIDER_SITE_OTHER): Payer: Medicaid Other | Admitting: Neurology

## 2019-06-24 ENCOUNTER — Other Ambulatory Visit: Payer: Self-pay

## 2019-06-24 VITALS — BP 122/84 | HR 86 | Ht 59.0 in | Wt 93.2 lb

## 2019-06-24 DIAGNOSIS — R569 Unspecified convulsions: Secondary | ICD-10-CM

## 2019-06-24 NOTE — Patient Instructions (Addendum)
1. Schedule MRI brain without contrast. [facility] will call you with an appointment date and time. If needed their number is [phone].  2. Schedule 1-hour EEG. Please schedule this at checkout before leaving today.  3. Recommend seeing a therapist/Behavioral Medicine for anxiety/depression/stress management. Try [facility] [address] [place], [place]  [address] [phone]  4. Follow-up in 3-4 months, call for any changes

## 2019-06-24 NOTE — Progress Notes (Signed)
NEUROLOGY CONSULTATION NOTE  Michele Alexander MRN: [id number] DOB: [birth year]  Referring provider: Dr. Ripley Fraise (ER) Primary care provider: [facility]  Reason for consult:  seizure  Dear Dr Christy Gentles:  Thank you for your kind referral of Michele Alexander for consultation of the above symptoms. Although her history is well known to you, please allow me to reiterate it for the purpose of our medical record. The patient was accompanied to the clinic by her husband who also provides collateral information. Records and images were personally reviewed where available.  HISTORY OF PRESENT ILLNESS: This is a pleasant 24 year old right-handed woman with a remote history of Hodgkin's lymphoma s/p chemotherapy at age 24, anxiety, anemia, presenting for evaluation of seizures. She recalls having a fainting episode followed by seizure-like activity at age 5 when she had the Nexplanon placed. She has had rare fainting spells around once a year, she recalls fainting in 2016, 2017 she fainted twice when she had a miscarriage and was told there was some shaking. She was brought to the ER on 05/25/2019 for seizure. She recalls having a bad headache the night prior with dizziness/lightheadedness. She recalls everything was moving, she lay on the bed and blacked out. She is amnestic of events. The next day she had 2 more episodes and was brought to the ER. No tongue bite or incontinence, she felt confused and diffusely weak, no focal weakness. Her husband reports her whole body got stiff with jerking. His brings a video of one episode where her eyes are closed, hands are flexed over the chest, with upper body rocking and shaking. In the ER, CBC, BMP, and head CT were unremarkable. She reports 3 more episodes since then, most recently on 06/05/2019.She was in the car and told her husband her head was starting to hurt, she got dizzy then blacked out. Her husband had to pull over. She reports  pain in the bitemporal regions. She recalls both feet were tingling at that time. Since then, she has had difficulty focusing, word-finding difficulties. She never used to stutter, but now she stutters all the time. She states her mood is "alright," she is a little anxious and depressed, looking into psychotherapy. She is asking about stress causing seizures, family reports she is very anxious and gets stressed out very easily. She does not drink alcohol or use illicit drugs. She usually gets 8 hours of sleep and states she has always been a night owl. Her husband denies any staring/unresponsive episodes. She denies any olfactory/gustatory hallucinations, focal numbness/tingling/weakness, myoclonic jerks. She had a normal birth and early development.  There is no history of febrile convulsions, CNS infections such as meningitis/encephalitis, significant traumatic brain injury, neurosurgical procedures, or family history of seizures.   PAST MEDICAL HISTORY: Past Medical History:  Diagnosis Date  . Anemia   . Anxiety   . Blood transfusion without reported diagnosis   . Cancer ([place])    Hodgkin's Lymphoma  . Depression   . Hodgkin's disease(201)   . Sickle cell trait ([place])    traits    PAST SURGICAL HISTORY: Past Surgical History:  Procedure Laterality Date  . DILATION AND EVACUATION N/A 12/21/2015   Procedure: DILATATION AND EVACUATION;  Surgeon: Will Bonnet, MD;  Location: [facility] ORS;  Service: Gynecology;  Laterality: N/A;  . NECK LESION BIOPSY    . OTHER SURGICAL HISTORY N/A 2015   Endoscopy  . PORT-A-CATH REMOVAL    . PORTACATH PLACEMENT Left 2010  MEDICATIONS: Current Outpatient Medications on File Prior to Visit  Medication Sig Dispense Refill  . ferrous sulfate 325 (65 FE) MG tablet Take 1 tablet (325 mg total) by mouth 3 (three) times daily with meals. 60 tablet 3  . ibuprofen (ADVIL) 600 MG tablet Take 1 tablet (600 mg total) by mouth every 6 (six) hours. 30 tablet 0  .  oxyCODONE (OXY IR/ROXICODONE) 5 MG immediate release tablet Take 1 tablet (5 mg total) by mouth every 6 (six) hours as needed for breakthrough pain. 12 tablet 0  . Prenatal Vit-Fe Fumarate-FA (PRENATAL VITAMIN PO) Take by mouth.     No current facility-administered medications on file prior to visit.    ALLERGIES: Allergies  Allergen Reactions  . Other Hives    Oral CT contrast Oral CT contrast  . Diatrizoate Hives    PO only.  Tolerates IV contrast.  . Iodinated Diagnostic Agents Hives  . Nexium [Esomeprazole Magnesium] Swelling  . Cheese Rash and Swelling    Lip swelling    FAMILY HISTORY: Family History  Problem Relation Age of Onset  . Hypertension Mother   . Diabetes Maternal Grandfather   . Hypertension Maternal Grandfather   . Cancer Maternal Grandfather   . Cancer Maternal Grandmother   . Asthma Sister   . Asthma Daughter     SOCIAL HISTORY: Social History   Socioeconomic History  . Marital status: Married    Spouse name: Not on file  . Number of children: 1  . Years of education: Not on file  . Highest education level: Not on file  Occupational History  . Not on file  Tobacco Use  . Smoking status: Never Smoker  . Smokeless tobacco: Never Used  Substance and Sexual Activity  . Alcohol use: No  . Drug use: No  . Sexual activity: Yes    Partners: Male  Other Topics Concern  . Not on file  Social History Narrative  . Not on file   Social Determinants of Health   Financial Resource Strain:   . Difficulty of Paying Living Expenses: Not on file  Food Insecurity:   . Worried About Charity fundraiser in the Last Year: Not on file  . Ran Out of Food in the Last Year: Not on file  Transportation Needs:   . Lack of Transportation (Medical): Not on file  . Lack of Transportation (Non-Medical): Not on file  Physical Activity:   . Days of Exercise per Week: Not on file  . Minutes of Exercise per Session: Not on file  Stress:   . Feeling of Stress :  Not on file  Social Connections:   . Frequency of Communication with Friends and Family: Not on file  . Frequency of Social Gatherings with Friends and Family: Not on file  . Attends Religious Services: Not on file  . Active Member of Clubs or Organizations: Not on file  . Attends Archivist Meetings: Not on file  . Marital Status: Not on file  Intimate Partner Violence:   . Fear of Current or Ex-Partner: Not on file  . Emotionally Abused: Not on file  . Physically Abused: Not on file  . Sexually Abused: Not on file    REVIEW OF SYSTEMS: Constitutional: No fevers, chills, or sweats, no generalized fatigue, change in appetite Eyes: No visual changes, double vision, eye pain Ear, nose and throat: No hearing loss, ear pain, nasal congestion, sore throat Cardiovascular: No chest pain, palpitations Respiratory:  No shortness  of breath at rest or with exertion, wheezes GastrointestinaI: No nausea, vomiting, diarrhea, abdominal pain, fecal incontinence Genitourinary:  No dysuria, urinary retention or frequency Musculoskeletal:  No neck pain, back pain Integumentary: No rash, pruritus, skin lesions Neurological: as above Psychiatric: + depression, anxiety Endocrine: No palpitations, fatigue, diaphoresis, mood swings, change in appetite, change in weight, increased thirst Hematologic/Lymphatic:  No anemia, purpura, petechiae. Allergic/Immunologic: no itchy/runny eyes, nasal congestion, recent allergic reactions, rashes  PHYSICAL EXAM: Vitals:   06/24/19 1255  BP: 122/84  Pulse: 86  SpO2: 100%   General: No acute distress Head:  Normocephalic/atraumatic Skin/Extremities: No rash, no edema Neurological Exam: Mental status: alert and oriented to person, place, and time, no dysarthria or aphasia, Fund of knowledge is appropriate.  Recent and remote memory are intact.  Attention and concentration are normal.    Able to name objects and repeat phrases. Cranial nerves: CN I: not  tested CN II: pupils equal, round and reactive to light, visual fields intact CN III, IV, VI:  full range of motion, no nystagmus, no ptosis CN V: facial sensation intact CN VII: upper and lower face symmetric CN VIII: hearing intact to finger rub CN IX, X: gag intact, uvula midline CN XI: sternocleidomastoid and trapezius muscles intact CN XII: tongue midline Bulk & Tone: normal, no fasciculations. Motor: 5/5 throughout with no pronator drift. Sensation: intact to light touch, cold, pin, vibration and joint position sense.  No extinction to double simultaneous stimulation.  Romberg test negative Deep Tendon Reflexes: +2 throughout, no ankle clonus Plantar responses: downgoing bilaterally Cerebellar: no incoordination on finger to nosetesting Gait: narrow-based and steady, able to tandem walk adequately. Tremor: none  IMPRESSION: This is a pleasant 24 year old right-handed woman with a remote history of Hodgkin's lymphoma s/p chemotherapy at age 68, anxiety, anemia, presenting for evaluation of seizures. She has a history of syncopal episodes in the past with some convulsive component (?convulsive syncope). Recently she has started having recurrent episodes of headache, dizziness, followed by loss of consciousness with shaking. Video showed by husband today showed body shaking/rocking with eyes closed. 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 She reports cognitive and speech changes since the events started, MRI brain with and without contrast and a 1-hour EEG will be ordered. [place] driving laws were discussed with the patient, and she knows to stop driving after an episode of loss of consciousness/awareness, until 6 months event-free. Follow-up in 3-4 months, they know to call for any changes.   Thank you for allowing me to participate in the care of this patient. Please do not hesitate to call for any questions or concerns.   Ellouise Newer,  M.D.  CC: Dr. Christy Gentles, [facility] Dept

## 2019-06-28 ENCOUNTER — Other Ambulatory Visit: Payer: Medicaid Other

## 2019-10-26 ENCOUNTER — Ambulatory Visit: Payer: Medicaid Other | Admitting: Neurology

## 2019-11-21 ENCOUNTER — Encounter: Payer: Self-pay | Admitting: Obstetrics and Gynecology

## 2019-11-21 ENCOUNTER — Ambulatory Visit (INDEPENDENT_AMBULATORY_CARE_PROVIDER_SITE_OTHER): Payer: Medicaid Other | Admitting: Obstetrics and Gynecology

## 2019-11-21 ENCOUNTER — Other Ambulatory Visit: Payer: Self-pay

## 2019-11-21 VITALS — BP 118/80 | HR 106 | Ht 59.0 in | Wt 88.0 lb

## 2019-11-21 DIAGNOSIS — Z3169 Encounter for other general counseling and advice on procreation: Secondary | ICD-10-CM | POA: Diagnosis not present

## 2019-11-21 DIAGNOSIS — N96 Recurrent pregnancy loss: Secondary | ICD-10-CM

## 2019-11-21 NOTE — Progress Notes (Signed)
Obstetrics & Gynecology Office Visit   Chief Complaint: Preconception Consult  History of Present Illness: Patient is a 24 y.o. [phone] interested in pursuing pregnancy in the near future.  She reports regular menstrual periods, and is currently using none for contraception.  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  Her past medical history is notable for history of Hodgkin's Lymphoma in 2010, as well as HgbS trait.  The patient is current on her vaccinations.  She has had chickenpox.  Family history for the patient and her partner's family were reviewed.  There is no family history of genetic diseases , specifically Dyann Kief disease, spinal muscular atrophy, muscular dystrophy, skeletal dysplasias, cystic fibrosis and sickle cell disease.  There is no family history of birth defects , specifically spina bifida, cardiac defects, omphalocele or gastroschisis and cleft lip or cleft palate.  There is no family history of mental retardation, fragile X, autism and or premature ovarian failure.     Review of Systems: Review of Systems  Constitutional: Negative.   Gastrointestinal: Negative.   Genitourinary: Negative.     Past Medical History:  Patient Active Problem List   Diagnosis Date Noted  . Premature rupture of membranes 03/25/2019  . Normal vaginal delivery 03/25/2019  . Postpartum care following vaginal delivery 03/25/2019  . Bacterial vaginosis 02/02/2019  . UTI (urinary tract infection) during pregnancy 01/10/2019  . Vaginal discharge during pregnancy 01/10/2019  . Supervision of normal pregnancy 08/31/2018      FAMILY TREE  LAB RESULTS  Language English Pap 08/31/18: neg  Initiated care at [facility] GC/CT Initial:  -/-          36wks:  Dating by LMP c/w 8wk U/S    Support person [place] NT/IT: neg    AFP:      MaterniT21: normal female    [place]/HgbE   Flu vaccine Declined 3/24 CF neg  TDaP vaccine 12/28/18  SMA neg  Rhogam  Fragile X neg        Anatomy US Normal female 'Natasha Mead' Blood Type O/Positive/-- (03/24 1603)  Feeding Plan breast Antibody Negative (03/24 1603)  Contraception POPs HBsAg Negative (03/24 1603)  Circumcision n/a RPR Non Reactive (03/24 1603)  Pediatrician [facility]  1.01 (03/24 1603)  Prenatal Classes declines HIV Non Reactive (03/24 1603)    GTT/A1C Early:      26-28wks:  77/90/97  BTL Consent n/a GBS        [ ]  PCN allergy  VBAC Consent n/a    Waterbirth [ ] Class [ ] Consent [ ] CNM visit PP Needs         . History of Hodgkin's lymphoma 04/23/2015  . Sickle cell trait ([place]) 04/23/2015    Past Surgical History:  Past Surgical History:  Procedure Laterality Date  . DILATION AND EVACUATION N/A 12/21/2015   Procedure: DILATATION AND EVACUATION;  Surgeon: Will Bonnet, MD;  Location: [facility] ORS;  Service: Gynecology;  Laterality: N/A;  . NECK LESION BIOPSY    . OTHER SURGICAL HISTORY N/A 2015   Endoscopy  . PORT-A-CATH REMOVAL    . PORTACATH PLACEMENT Left 2010    Gynecologic History: Patient's last menstrual period was 11/15/2019.  Obstetric History: [id number]  Family History:  Family History  Problem Relation Age of Onset  . Hypertension Mother   . Diabetes Maternal Grandfather   . Hypertension Maternal Grandfather   . Cancer Maternal Grandfather   . Cancer Maternal Grandmother   . Asthma Sister   .  Asthma Daughter     Social History:  Social History   Socioeconomic History  . Marital status: Married    Spouse name: Not on file  . Number of children: 1  . Years of education: Not on file  . Highest education level: Not on file  Occupational History  . Not on file  Tobacco Use  . Smoking status: Never Smoker  . Smokeless tobacco: Never Used  Vaping Use  . Vaping Use: Never used  Substance and Sexual Activity  . Alcohol use: No  . Drug use: No  . Sexual activity: Yes    Partners: Male  Other Topics Concern  . Not on file  Social History Narrative   Right handed        Lives with husband in two story home   Social Determinants of Health   Financial Resource Strain:   . Difficulty of Paying Living Expenses:   Food Insecurity:   . Worried About Charity fundraiser in the Last Year:   . Arboriculturist in the Last Year:   Transportation Needs:   . Film/video editor (Medical):   Marland Kitchen Lack of Transportation (Non-Medical):   Physical Activity:   . Days of Exercise per Week:   . Minutes of Exercise per Session:   Stress:   . Feeling of Stress :   Social Connections:   . Frequency of Communication with Friends and Family:   . Frequency of Social Gatherings with Friends and Family:   . Attends Religious Services:   . Active Member of Clubs or Organizations:   . Attends Archivist Meetings:   Marland Kitchen Marital Status:   Intimate Partner Violence:   . Fear of Current or Ex-Partner:   . Emotionally Abused:   Marland Kitchen Physically Abused:   . Sexually Abused:     Allergies:  Allergies  Allergen Reactions  . Other Hives    Oral CT contrast Oral CT contrast  . Diatrizoate Hives    PO only.  Tolerates IV contrast.  . Iodinated Diagnostic Agents Hives  . Nexium [Esomeprazole Magnesium] Swelling  . Cheese Rash and Swelling    Lip swelling    Medications: Prior to Admission medications   Medication Sig Start Date End Date Taking? Authorizing Provider  ferrous sulfate 325 (65 FE) MG tablet Take 1 tablet (325 mg total) by mouth 3 (three) times daily with meals. 02/03/19  Yes Gavin Pound, CNM  ibuprofen (ADVIL) 600 MG tablet Take 1 tablet (600 mg total) by mouth every 6 (six) hours. 03/26/19  Yes Will Bonnet, MD  oxyCODONE (OXY IR/ROXICODONE) 5 MG immediate release tablet Take 1 tablet (5 mg total) by mouth every 6 (six) hours as needed for breakthrough pain. 03/26/19  Yes Will Bonnet, MD  Prenatal Vit-Fe Fumarate-FA (PRENATAL VITAMIN PO) Take by mouth.   Yes [provider]    Physical Exam Vitals: Blood pressure 118/80,  pulse (!) 106, height 4\' 11"  (1.499 m), weight 88 lb (39.9 kg), last menstrual period 11/15/2019, unknown if currently breastfeeding. Body mass index is 17.77 kg/m.  General: NAD, well nourished, appears stated age 73: normocephalic, anicteric Pulmonary: No increased work of breathing Neurologic: Grossly intact Psychiatric: mood appropriate, affect full   Assessment: 24 y.o. 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  The role and rational of prenatal vitamins in preventing neural tube defects were discussed.  2) Immunizations are up to date  3)  Definition of recurrent miscarriage, defined as 3 or more successive first trimester losses discussed in detail.  The most commonly identifiable etiology is antiphospholipid antibody syndrome (APS).  While APS is the only thrombophilia with a clearly proven association for first trimester miscarriage, other hypercoagulable disorders while showing a weak or inconsistent relationship to first trimester miscarriage are often times still tested for,  These include Factor V Leiden, MTHFR (homozygous), prothrombin gene mutation, Protein C and S deficiency.  Uterine structural lesions, endocrine factors (thyoid disease, diabetes, and PCOS), and disorders in parental karyotype has also been implicated.  Use of daily Asprin has been evaluated in the setting of recurrent pregnancy loss and shows no clear benefit in patient without antiphospholipid antibody syndrome.  Approximately 50% of couple with recurrent miscarriage will  not have a readily identifiable etiology  Several studies have examined and established a role for emotional support and stress reduction in improving pregnancy outcomes for patient with recurrent miscarriage. The tender love and care model utilizes weekly to twice weekly follow up, more frequent ultrasound, and has consistently demonstrated improved live birth rates in several studies.  The risk of miscarriage following documentation of fetal heart tones drops significantly to approximately 3%.  Family history reviewed.  Preconception genetic testing and or counseling was not offered at today's visit based on review of personal and family history.  5) A total of 15 minutes were spent in face-to-face contact with the patient during this encounter with over half of that time devoted to counseling and coordination of care.  6) Return in about 1 day (around 11/22/2019) for labs sometime later this week.   Malachy Mood, MD, [facility], [facility] 11/21/2019, 4:34 PM

## 2019-11-25 ENCOUNTER — Other Ambulatory Visit: Payer: Self-pay

## 2019-11-25 ENCOUNTER — Other Ambulatory Visit: Payer: Medicaid Other

## 2019-11-25 DIAGNOSIS — N96 Recurrent pregnancy loss: Secondary | ICD-10-CM

## 2019-12-02 ENCOUNTER — Other Ambulatory Visit: Payer: Medicaid Other

## 2019-12-02 ENCOUNTER — Other Ambulatory Visit: Payer: Self-pay | Admitting: Obstetrics and Gynecology

## 2019-12-02 ENCOUNTER — Other Ambulatory Visit: Payer: Self-pay

## 2019-12-02 DIAGNOSIS — N96 Recurrent pregnancy loss: Secondary | ICD-10-CM

## 2019-12-02 DIAGNOSIS — O3680X0 Pregnancy with inconclusive fetal viability, not applicable or unspecified: Secondary | ICD-10-CM

## 2019-12-02 NOTE — Telephone Encounter (Signed)
Left VM with py to call back and schedule

## 2019-12-02 NOTE — Telephone Encounter (Signed)
Labs are in if patient can come in today

## 2019-12-03 LAB — BETA HCG QUANT (REF LAB): hCG Quant: <1 m[IU]/mL

## 2019-12-05 LAB — FACTOR V LEIDEN

## 2019-12-05 LAB — LUPUS ANTICOAGULANT PANEL
APTT: 28.6 s
Anticardiolipin Ab, IgG: <10 [GPL'U]
Anticardiolipin Ab, IgM: <10 [MPL'U]
Beta-2 Glycoprotein I, IgA: <10 SAU
Beta-2 Glycoprotein I, IgG: <10 SGU
Beta-2 Glycoprotein I, IgM: <10 SMU
DRVVT Screen Seconds: 30 s
Hexagonal Phospholipid Neutral: 0 s
INR: 1 ratio
Platelet Neutralization: 0 s
Prothrombin Time: 10.9 s
Thrombin Time: 18.2 s

## 2019-12-05 LAB — CARDIOLIPIN ANTIBODIES, IGG, IGM, IGA
Anticardiolipin IgA: <9 APL U/mL (ref 0–11)
Anticardiolipin IgG: <9 GPL U/mL (ref 0–14)
Anticardiolipin IgM: 12 MPL U/mL (ref 0–12)

## 2019-12-05 LAB — TSH: TSH: 1.35 u[IU]/mL (ref 0.450–4.500)

## 2019-12-05 LAB — ANTITHROMBIN III: AntiThromb III Func: 151 % — ABNORMAL HIGH (ref 75–135)

## 2020-02-09 ENCOUNTER — Other Ambulatory Visit: Payer: Self-pay | Admitting: Obstetrics and Gynecology

## 2020-02-09 DIAGNOSIS — N96 Recurrent pregnancy loss: Secondary | ICD-10-CM

## 2020-02-09 DIAGNOSIS — O3680X0 Pregnancy with inconclusive fetal viability, not applicable or unspecified: Secondary | ICD-10-CM

## 2020-02-09 NOTE — Telephone Encounter (Signed)
Labs today and Tuesday

## 2020-02-09 NOTE — Telephone Encounter (Signed)
Called and voicemail is full unable to leave message

## 2020-02-09 NOTE — Telephone Encounter (Signed)
Can you schedule

## 2020-02-10 ENCOUNTER — Other Ambulatory Visit: Payer: Self-pay

## 2020-02-10 ENCOUNTER — Other Ambulatory Visit: Payer: Medicaid Other

## 2020-02-10 DIAGNOSIS — O3680X0 Pregnancy with inconclusive fetal viability, not applicable or unspecified: Secondary | ICD-10-CM

## 2020-02-10 DIAGNOSIS — N96 Recurrent pregnancy loss: Secondary | ICD-10-CM

## 2020-02-10 NOTE — Telephone Encounter (Signed)
Patient schedule for 02/10/20

## 2020-02-11 LAB — BETA HCG QUANT (REF LAB): hCG Quant: 9 m[IU]/mL

## 2020-02-11 LAB — PROGESTERONE: Progesterone: 1 ng/mL

## 2020-03-18 IMAGING — CT CT HEAD W/O CM
3 series · 15 of 47 positions shown, 18 images · non-contrast
Comparison: None.

CLINICAL DATA: Two seizures today.

EXAM:
CT HEAD WITHOUT CONTRAST
TECHNIQUE: Contiguous axial images were obtained from the base of the skull
through the vertex without intravenous contrast.

[Series 2: head w o · axial · 0.43mm/px · z∈[+71,+196]mm · 9 of 31 slices shown, 12 images]
[im 3/31  brain]
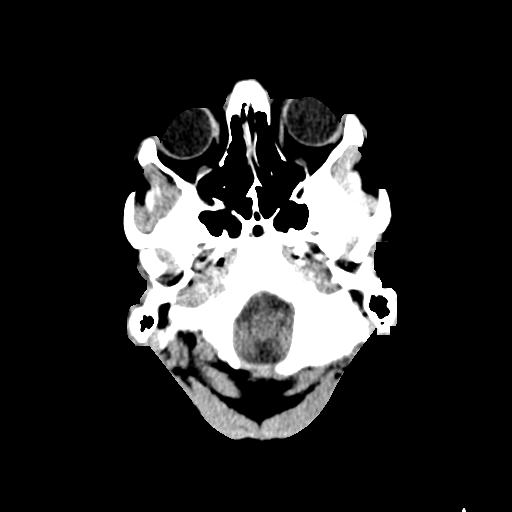
[im 3/31  bone]
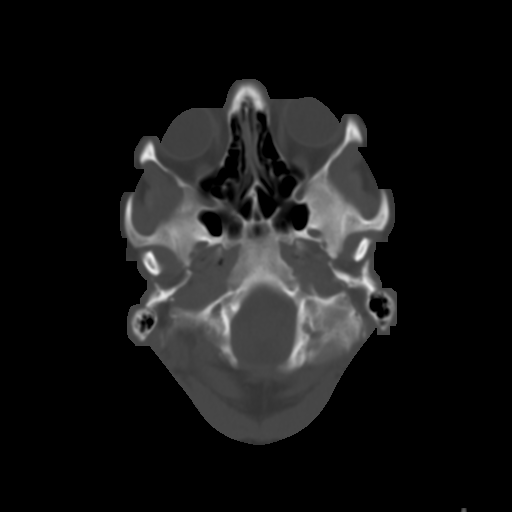
[im 6/31  brain]
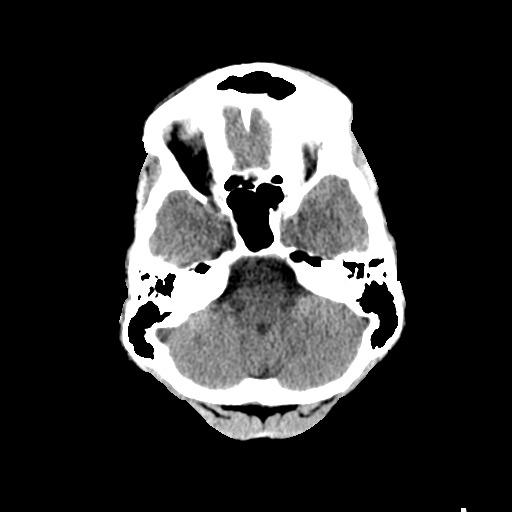
[im 9/31  brain]
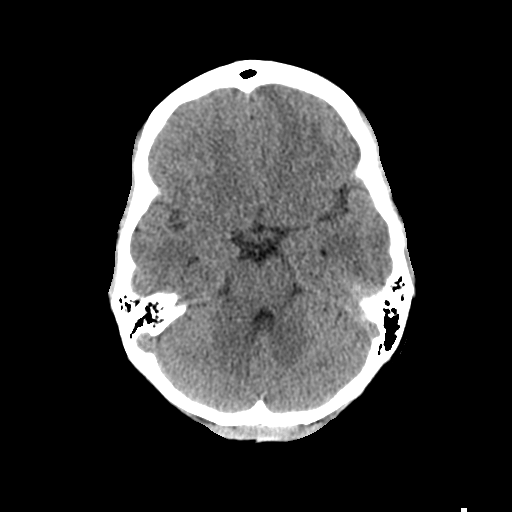
[im 12/31  brain]
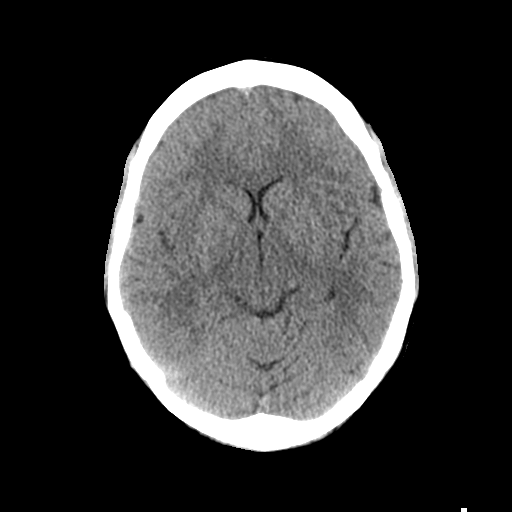
[im 16/31  brain]
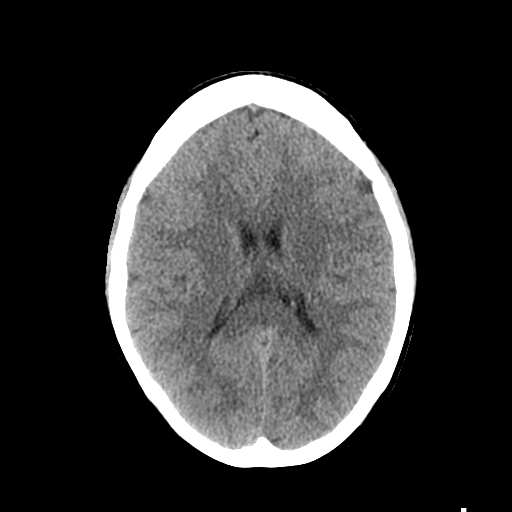
[im 16/31  bone]
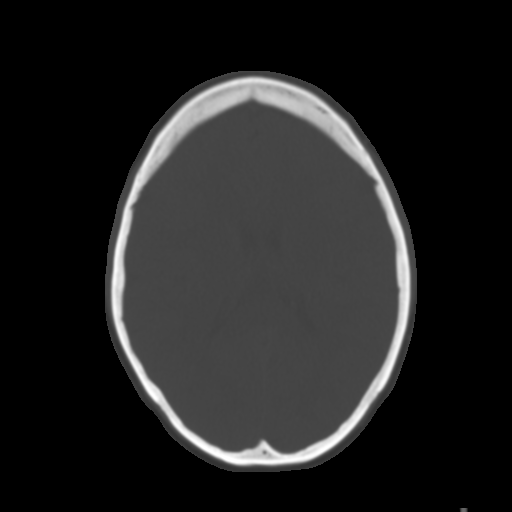
[im 19/31  brain]
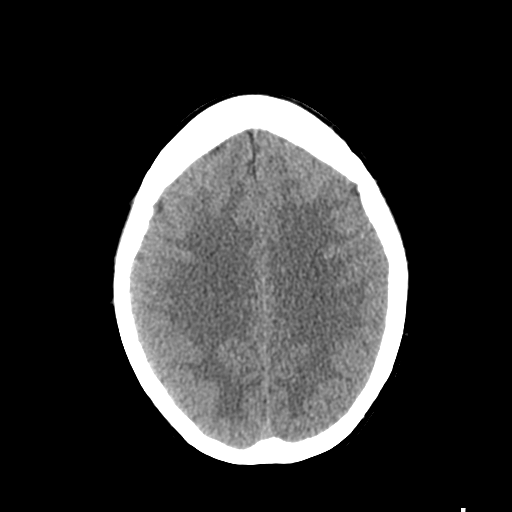
[im 22/31  brain]
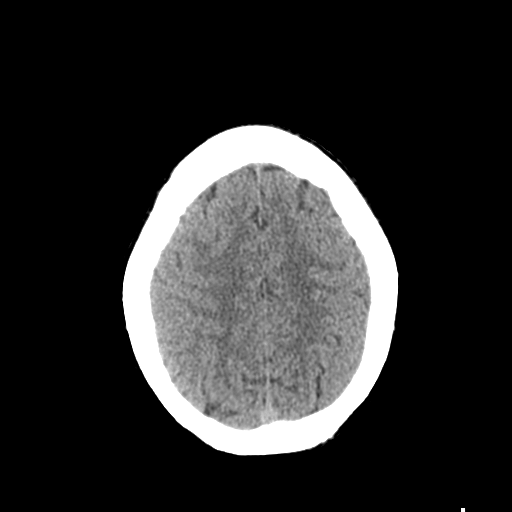
[im 25/31  brain]
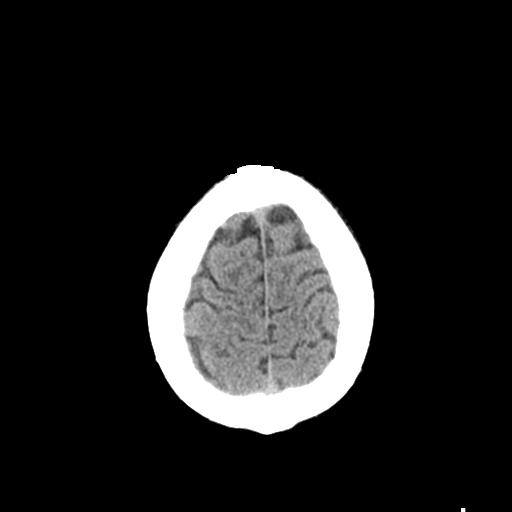
[im 28/31  brain]
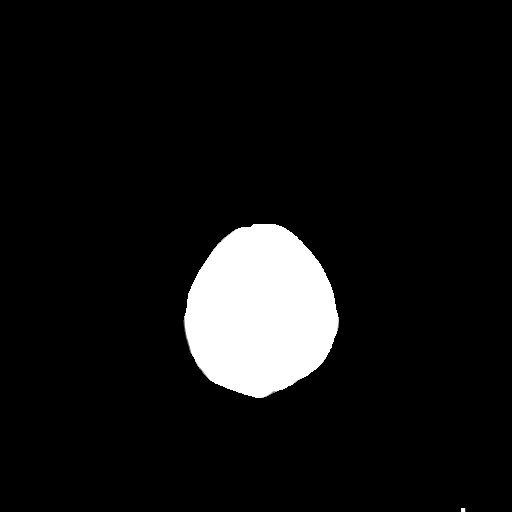
[im 28/31  bone]
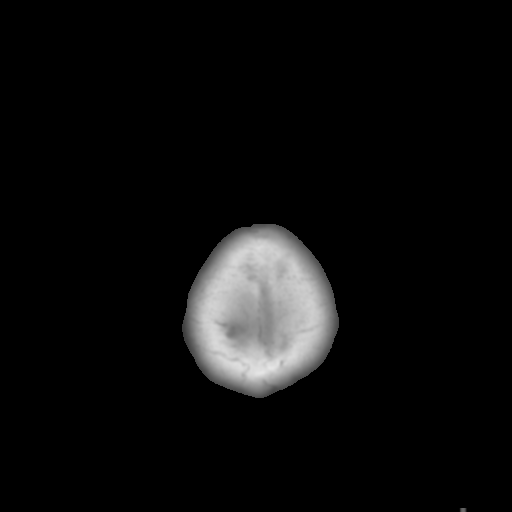

[Series 4: coronal soft · coronal · 0.28mm/px · 3 of 59 slices shown]
[im 20/59  brain]
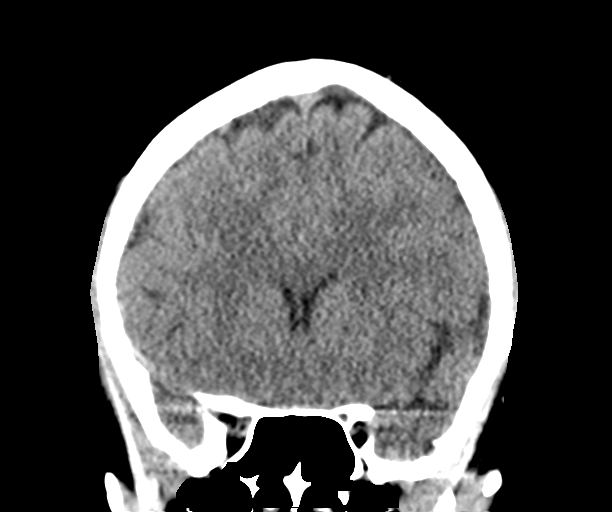
[im 26/59  brain]
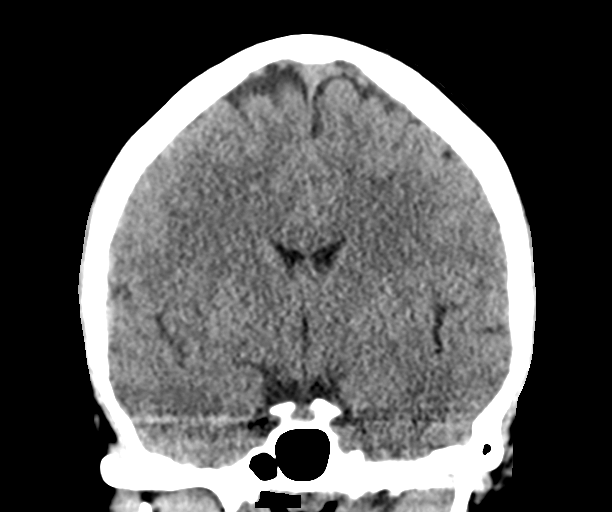
[im 33/59  brain]
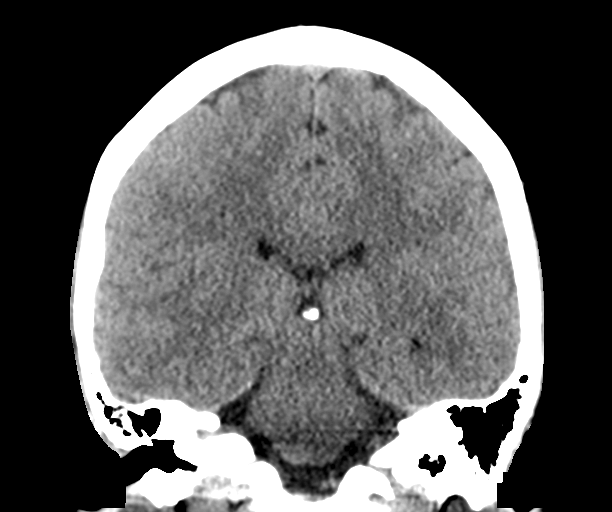

[Series 5: sagittal soft · sagittal · 0.30mm/px · 3 of 48 slices shown]
[im 16/48  brain]
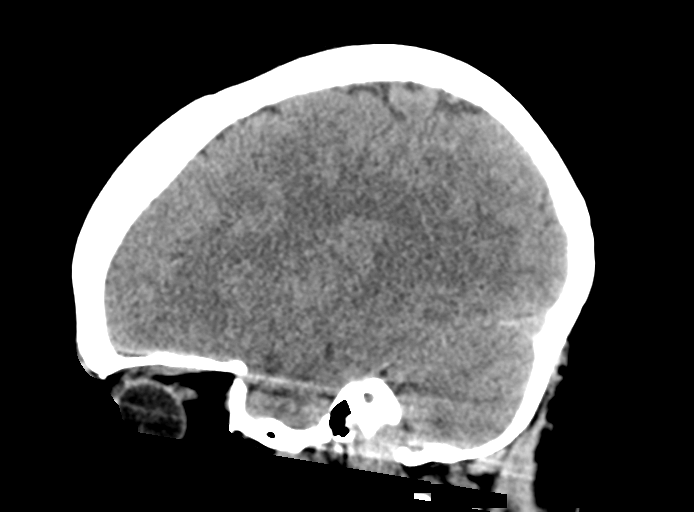
[im 24/48  brain]
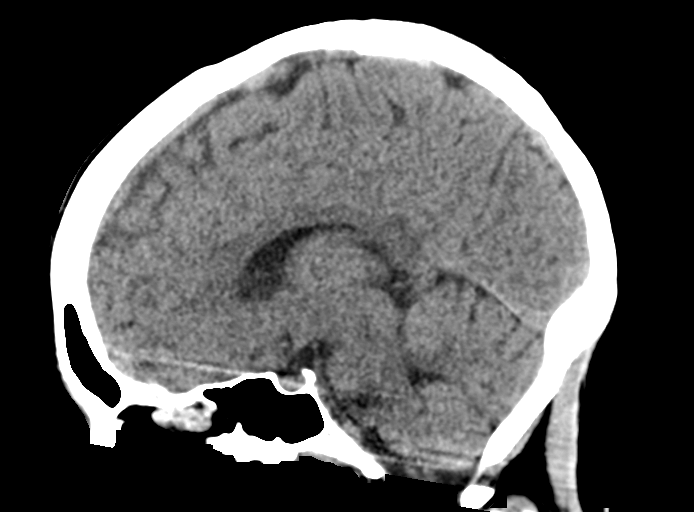
[im 32/48  brain]
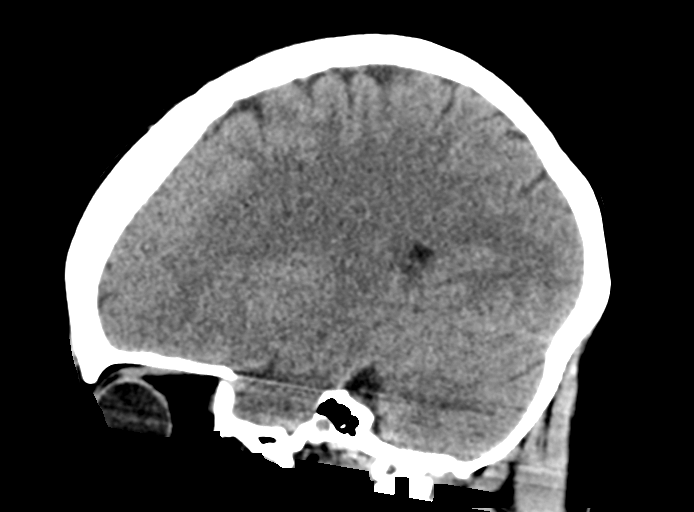

[15 of 47 positions shown; findings below may reference images not displayed]

FINDINGS: Brain: No intracranial hemorrhage, mass effect, or midline shift. No
hydrocephalus. The basilar cisterns are patent. No evidence of
territorial infarct or acute ischemia. No extra-axial or
intracranial fluid collection.

Vascular: No hyperdense vessel or unexpected calcification.

Skull: Normal. Negative for fracture or focal lesion.

Sinuses/Orbits: Paranasal sinuses and mastoid air cells are clear.
The visualized orbits are unremarkable.

Other: None.
IMPRESSION: Unremarkable noncontrast head CT.

## 2020-04-01 ENCOUNTER — Emergency Department
Admission: EM | Admit: 2020-04-01 | Discharge: 2020-04-01 | Disposition: A | Discharge status: Home / Self Care | Payer: Medicaid Other | Attending: Emergency Medicine | Admitting: Emergency Medicine

## 2020-04-01 ENCOUNTER — Other Ambulatory Visit: Payer: Self-pay

## 2020-04-01 ENCOUNTER — Encounter: Payer: Self-pay | Admitting: Emergency Medicine

## 2020-04-01 DIAGNOSIS — Z859 Personal history of malignant neoplasm, unspecified: Secondary | ICD-10-CM | POA: Insufficient documentation

## 2020-04-01 DIAGNOSIS — R102 Pelvic and perineal pain: Secondary | ICD-10-CM | POA: Insufficient documentation

## 2020-04-01 LAB — URINALYSIS, COMPLETE (UACMP) WITH MICROSCOPIC
Bacteria, UA: NONE SEEN
Bilirubin Urine: NEGATIVE
Glucose, UA: NEGATIVE mg/dL
Hgb urine dipstick: NEGATIVE
Ketones, ur: 5 mg/dL — AB
Nitrite: NEGATIVE
Protein, ur: NEGATIVE mg/dL
Specific Gravity, Urine: 1.016 (ref 1.005–1.030)
pH: 5 (ref 5.0–8.0)

## 2020-04-01 LAB — CHLAMYDIA/NGC RT PCR (ARMC ONLY)
Chlamydia Tr: NOT DETECTED
N gonorrhoeae: NOT DETECTED

## 2020-04-01 LAB — WET PREP, GENITAL
Clue Cells Wet Prep HPF POC: NONE SEEN
Sperm: NONE SEEN
Trich, Wet Prep: NONE SEEN
Yeast Wet Prep HPF POC: NONE SEEN

## 2020-04-01 LAB — POCT PREGNANCY, URINE: Preg Test, Ur: NEGATIVE

## 2020-04-01 NOTE — ED Triage Notes (Signed)
Pt reports would like to find out if she is pregnant and if she is would like to get an [place] or something to make sure she is not going to miscarriage. Pt reports had her cycle on 10/1 but she took a test and it was positive and she started having a little abd discomfort this am and she has had miscarriages in the past.

## 2020-04-01 NOTE — ED Provider Notes (Signed)
[facility] Emergency Department Provider Note  ____________________________________________  Time seen: Approximately 4:55 PM  I have reviewed the triage vital signs and the nursing notes.   HISTORY  Chief Complaint Abdominal Pain    HPI Michele Alexander is a 24 y.o. female who presents emergency department for pelvic cramping, concern for pregnancy/miscarriage.  Patient states that she had some mild pelvic cramping earlier today.  None currently.  She states that her last menstrual cycle was 10/1.  She states that she has had no vaginal bleeding or discharge.  3 days ago she had some nausea, took a home pregnancy test and states that there was a faint positive line.  Patient has a history of pregnancies resulting in miscarriages and was concerned that her pain with a possible positive pregnancy test could be a pregnancy/miscarriage.  No fevers or chills, no URI symptoms, no chest pain, no abdominal pain, nausea vomiting, diarrhea or constipation.  No dysuria, polyuria, hematuria.  No concern for STDs.  Patient denies any vaginal discharge.  Patient with a medical history of anemia, Hodgkin's lymphoma in 2010, recurrent miscarriages.         Past Medical History:  Diagnosis Date  . Anemia   . Anxiety   . Blood transfusion without reported diagnosis   . Cancer ([place])    Hodgkin's Lymphoma  . Depression   . Hodgkin's disease(201)   . Sickle cell trait ([place])    traits    Patient Active Problem List   Diagnosis Date Noted  . Premature rupture of membranes 03/25/2019  . Normal vaginal delivery 03/25/2019  . Postpartum care following vaginal delivery 03/25/2019  . Bacterial vaginosis 02/02/2019  . UTI (urinary tract infection) during pregnancy 01/10/2019  . Vaginal discharge during pregnancy 01/10/2019  . Supervision of normal pregnancy 08/31/2018  . History of Hodgkin's lymphoma 04/23/2015  . Sickle cell trait ([place]) 04/23/2015    Past Surgical History:   Procedure Laterality Date  . DILATION AND EVACUATION N/A 12/21/2015   Procedure: DILATATION AND EVACUATION;  Surgeon: Will Bonnet, MD;  Location: [facility] ORS;  Service: Gynecology;  Laterality: N/A;  . NECK LESION BIOPSY    . OTHER SURGICAL HISTORY N/A 2015   Endoscopy  . PORT-A-CATH REMOVAL    . PORTACATH PLACEMENT Left 2010    Prior to Admission medications   Medication Sig Start Date End Date Taking? Authorizing Provider  ferrous sulfate 325 (65 FE) MG tablet Take 1 tablet (325 mg total) by mouth 3 (three) times daily with meals. 02/03/19   Gavin Pound, CNM  ibuprofen (ADVIL) 600 MG tablet Take 1 tablet (600 mg total) by mouth every 6 (six) hours. 03/26/19   Will Bonnet, MD  oxyCODONE (OXY IR/ROXICODONE) 5 MG immediate release tablet Take 1 tablet (5 mg total) by mouth every 6 (six) hours as needed for breakthrough pain. 03/26/19   Will Bonnet, MD  Prenatal Vit-Fe Fumarate-FA (PRENATAL VITAMIN PO) Take by mouth.    [provider]    Allergies Other, Diatrizoate, Iodinated diagnostic agents, Nexium [esomeprazole magnesium], and Cheese  Family History  Problem Relation Age of Onset  . Hypertension Mother   . Diabetes Maternal Grandfather   . Hypertension Maternal Grandfather   . Cancer Maternal Grandfather   . Cancer Maternal Grandmother   . Asthma Sister   . Asthma Daughter     Social History Social History   Tobacco Use  . Smoking status: Never Smoker  . Smokeless tobacco: Never Used  Vaping  Use  . Vaping Use: Never used  Substance Use Topics  . Alcohol use: No  . Drug use: No     Review of Systems  Constitutional: No fever/chills Eyes: No visual changes. No discharge ENT: No upper respiratory complaints. Cardiovascular: no chest pain. Respiratory: no cough. No SOB. Gastrointestinal: No abdominal pain.  No nausea, no vomiting.  No diarrhea.  No constipation. Genitourinary: Negative for dysuria. No hematuria.  Positive for pelvic  cramping Musculoskeletal: Negative for musculoskeletal pain. Skin: Negative for rash, abrasions, lacerations, ecchymosis. Neurological: Negative for headaches, focal weakness or numbness.  10 System ROS otherwise negative.  ____________________________________________   PHYSICAL EXAM:  VITAL SIGNS: ED Triage Vitals  Enc Vitals Group     BP --      Pulse --      Resp --      Temp --      Temp src --      SpO2 --      Weight 04/01/20 1428 92 lb (41.7 kg)     Height 04/01/20 1428 4\' 11"  (1.499 m)     Head Circumference --      Peak Flow --      Pain Score 04/01/20 1427 6     Pain Loc --      Pain Edu? --      Excl. in [place]? --      Constitutional: Alert and oriented. Well appearing and in no acute distress. Eyes: Conjunctivae are normal. PERRL. EOMI. Head: Atraumatic. ENT:      Ears:       Nose: No congestion/rhinnorhea.      Mouth/Throat: Mucous membranes are moist.  Neck: No stridor.   Hematological/Lymphatic/Immunilogical: No cervical lymphadenopathy. Cardiovascular: Normal rate, regular rhythm. Normal S1 and S2.  Good peripheral circulation. Respiratory: Normal respiratory effort without tachypnea or retractions. Lungs CTAB. Good air entry to the bases with no decreased or absent breath sounds. Gastrointestinal: Bowel sounds 4 quadrants. Soft and nontender to palpation. No guarding or rigidity. No palpable masses. No distention. No CVA tenderness. Genitourinary: No visible external lesions.  Pelvic exam reveals minimal clear discharge in the vaginal canal.  Cervix is visually unremarkable.  No vaginal lesions or tears.  Bimanual exam reveals no cervical motion to tenderness.  No palpable masses or lesions in the bilateral adnexa. Musculoskeletal: Full range of motion to all extremities. No gross deformities appreciated. Neurologic:  Normal speech and language. No gross focal neurologic deficits are appreciated.  Skin:  Skin is warm, dry and intact. No rash  noted. Psychiatric: Mood and affect are normal. Speech and behavior are normal. Patient exhibits appropriate insight and judgement.   ____________________________________________   LABS (all labs ordered are listed, but only abnormal results are displayed)  Labs Reviewed  WET PREP, GENITAL - Abnormal; Notable for the following components:      Result Value   WBC, Wet Prep HPF POC MODERATE (*)    All other components within normal limits  URINALYSIS, COMPLETE (UACMP) WITH MICROSCOPIC - Abnormal; Notable for the following components:   Color, Urine YELLOW (*)    APPearance CLEAR (*)    Ketones, ur 5 (*)    Leukocytes,Ua TRACE (*)    All other components within normal limits  CHLAMYDIA/NGC RT PCR ([facility] ONLY)  POC URINE PREG, ED  POCT PREGNANCY, URINE   ____________________________________________  EKG   ____________________________________________  RADIOLOGY I personally viewed and evaluated these images as part of my medical decision making, as well as reviewing  the written report by the radiologist.  ED Provider Interpretation:   No results found.  ____________________________________________    PROCEDURES  Procedure(s) performed:    Procedures    Medications - No data to display   ____________________________________________   INITIAL IMPRESSION / ASSESSMENT AND PLAN / ED COURSE  Pertinent labs & imaging results that were available during my care of the patient were reviewed by me and considered in my medical decision making (see chart for details).  Review of the [place] CSRS was performed in accordance of the [place] prior to dispensing any controlled drugs.           Patient's diagnosis is consistent with Pelvic pain.  Patient presented to emergency department complaining of pelvic pain earlier today.  Patient states that she has a history of recurrent miscarriages.  Her last menstrual cycle was normal and was on the first of this month.   Patient states that she had some nausea several days ago so she took a home pregnancy test which had a faint positive.  Patient had pelvic pain today she presents the emergency department for evaluation as she is concerned that she was pregnant and having a miscarriage.  There was no vaginal bleeding reported..  Patient had a negative pregnancy test here in the emergency department.  Pelvic exam revealed no vaginal lesions or tears.  No vaginal bleeding identified on pelvic exam.  No cervical motion tenderness.  Labs are reassuring.  No evidence of BV.  Review of patient's medical record, it appears that patient has had home pregnancy test in the past with negative hCG test.  On review of medical records, patient has had home pregnancy tests that were reportedly positive in June, July, September, and now again in October.  Patient has been followed by OB/GYN for these reportedly positive test with negative hCG testing.  At this time with a negative pregnancy test here in the emergency department I have recommended that patient follow-up with her OB/GYN for her concerns.  No evidence for imaging as patient is currently asymptomatic in regard to pelvic pain.  There was no cervical motion tenderness and no BV to be concern for PID.  Urinalysis was reassuring with no evidence of UTI.  Patient may take over-the-counter medications for any return of her pelvic cramping..Patient is stable for discharge at this time.  Return precautions discussed with the patient.  Follow-up with OB/GYN.  Patient is given ED precautions to return to the ED for any worsening or new symptoms.     ____________________________________________  FINAL CLINICAL IMPRESSION(S) / ED DIAGNOSES  Final diagnoses:  Pelvic pain in female      NEW MEDICATIONS STARTED DURING THIS VISIT:  ED Discharge Orders    None          This chart was dictated using voice recognition software/Dragon. Despite best efforts to proofread, errors  can occur which can change the meaning. Any change was purely unintentional.    Darletta Moll, PA-C 04/01/20 1937    Carrie Mew, MD 04/01/20 [phone]

## 2020-04-04 ENCOUNTER — Other Ambulatory Visit: Payer: Self-pay | Admitting: Obstetrics and Gynecology

## 2020-04-04 ENCOUNTER — Other Ambulatory Visit: Payer: Self-pay

## 2020-04-04 ENCOUNTER — Other Ambulatory Visit: Payer: Medicaid Other

## 2020-04-04 DIAGNOSIS — O3680X0 Pregnancy with inconclusive fetal viability, not applicable or unspecified: Secondary | ICD-10-CM

## 2020-04-04 NOTE — Telephone Encounter (Signed)
Labs visit today and Friday orders in

## 2020-04-04 NOTE — Telephone Encounter (Signed)
Patient is scheduled today at 2:40 and Friday, 04/06/20 at 2:20

## 2020-04-05 LAB — BETA HCG QUANT (REF LAB): hCG Quant: 237 m[IU]/mL

## 2020-04-05 LAB — PROGESTERONE: Progesterone: 18.6 ng/mL

## 2020-04-06 ENCOUNTER — Other Ambulatory Visit: Payer: Medicaid Other

## 2020-04-06 ENCOUNTER — Other Ambulatory Visit: Payer: Self-pay

## 2020-04-06 DIAGNOSIS — O3680X0 Pregnancy with inconclusive fetal viability, not applicable or unspecified: Secondary | ICD-10-CM

## 2020-04-07 LAB — BETA HCG QUANT (REF LAB): hCG Quant: 598 m[IU]/mL

## 2020-04-17 NOTE — Telephone Encounter (Signed)
Not sure if we have an earlier NOB appointment any provider is fine

## 2020-04-25 ENCOUNTER — Other Ambulatory Visit: Payer: Self-pay

## 2020-04-25 ENCOUNTER — Other Ambulatory Visit (HOSPITAL_COMMUNITY)
Admission: RE | Admit: 2020-04-25 | Discharge: 2020-04-25 | Disposition: A | Discharge status: Home / Self Care | Payer: Medicaid Other | Source: Ambulatory Visit | Attending: Obstetrics and Gynecology | Admitting: Obstetrics and Gynecology

## 2020-04-25 ENCOUNTER — Ambulatory Visit (INDEPENDENT_AMBULATORY_CARE_PROVIDER_SITE_OTHER): Payer: Medicaid Other | Admitting: Obstetrics and Gynecology

## 2020-04-25 ENCOUNTER — Encounter: Payer: Self-pay | Admitting: Obstetrics and Gynecology

## 2020-04-25 VITALS — BP 100/65 | Wt 91.0 lb

## 2020-04-25 DIAGNOSIS — Z113 Encounter for screening for infections with a predominantly sexual mode of transmission: Secondary | ICD-10-CM

## 2020-04-25 DIAGNOSIS — Z3689 Encounter for other specified antenatal screening: Secondary | ICD-10-CM

## 2020-04-25 DIAGNOSIS — Z3480 Encounter for supervision of other normal pregnancy, unspecified trimester: Secondary | ICD-10-CM

## 2020-04-25 DIAGNOSIS — F321 Major depressive disorder, single episode, moderate: Secondary | ICD-10-CM

## 2020-04-25 LAB — POCT URINALYSIS DIPSTICK OB
Appearance: NORMAL
Bilirubin, UA: NEGATIVE
Blood, UA: NEGATIVE
Glucose, UA: NEGATIVE
Ketones, UA: NEGATIVE
Leukocytes, UA: NEGATIVE
Nitrite, UA: NEGATIVE
Odor: NORMAL
Spec Grav, UA: 1.02 (ref 1.010–1.025)
Urobilinogen, UA: 0.2 E.U./dL
pH, UA: 6.5 (ref 5.0–8.0)

## 2020-04-25 LAB — OB RESULTS CONSOLE VARICELLA ZOSTER ANTIBODY, IGG: Varicella: NON-IMMUNE/NOT IMMUNE

## 2020-04-25 LAB — OB RESULTS CONSOLE GC/CHLAMYDIA: Gonorrhea: NEGATIVE

## 2020-04-25 MED ORDER — FOLIC ACID 1 MG PO TABS: 1.0000 mg | ORAL_TABLET | Freq: Every day | ORAL | 10 refills | Status: DC

## 2020-04-25 MED ORDER — SERTRALINE HCL 25 MG PO TABS: 50.0000 mg | ORAL_TABLET | Freq: Every day | ORAL | 5 refills | Status: DC

## 2020-04-25 NOTE — Progress Notes (Signed)
NOB. RM 3

## 2020-04-25 NOTE — Progress Notes (Signed)
New Obstetric Patient H&P    Chief Complaint: "Desires prenatal care"   History of Present Illness: Patient is a 24 y.o. [phone] Hispanic or Latino female, presents with amenorrhea and positive home pregnancy test. Patient's last menstrual period was 03/09/2020 (approximate). and based on her  LMP, her EDD is Estimated Date of Delivery: 12/14/20 and her EGA is [redacted]w[redacted]d . Cycles are regular monthly. Her last pap smear was on 08/31/2018 NILM.     She had a urine pregnancy test which was positive 2 week(s)  ago. HCG have been trended and showed appropriate rise.  Her last menstrual period was normal. Since her LMP she claims she has experienced nausea, fatigue, and breast tenderness. She denies vaginal bleeding. Her past medical history is notable for history of Hodgkin lymphoma. Her prior pregnancies are notable for uncomplicated  Since her LMP, she admits to the use of tobacco products  no There are cats in the home in the home  yes If yes Outdoor She admits close contact with children on a regular basis  yes  She has had chicken pox in the past no She has had Tuberculosis exposures, symptoms, or previously tested positive for TB   no Current or past history of domestic violence. no  Genetic Screening/Teratology Counseling: (Includes patient, baby's father, or anyone in either family with:)   53. Patient's age >/= 55 at [facility]  no 2. Thalassemia ([place], [place], [place], or Asian background): MCV<80  no 3. Neural tube defect (meningomyelocele, spina bifida, anencephaly)  no 4. Congenital heart defect  no  5. Down syndrome  no 6. Tay-Sachs (Jewish, [place])  no 7. Canavan's Disease  no 8. Sickle cell disease or trait (African)  no  9. Hemophilia or other blood disorders  no  10. Muscular dystrophy  no  11. Cystic fibrosis  no  12. Huntington's Chorea  no  13. Mental retardation/autism  no 14. Other inherited genetic or chromosomal disorder  no 15. Maternal metabolic disorder  (DM, PKU, etc)  no 16. Patient or FOB with a child with a birth defect not listed above no  16a. Patient or FOB with a birth defect themselves no 17. Recurrent pregnancy loss, or stillbirth  yes with negative work up (no karyotype) 18. Any medications since LMP other than prenatal vitamins (include vitamins, supplements, OTC meds, drugs, alcohol)  not applicable 19. Any other genetic/environmental exposure to discuss  no  Infection History:   1. Lives with someone with TB or TB exposed  no  2. Patient or partner has history of genital herpes  no 3. Rash or viral illness since LMP  no 4. History of STI (GC, CT, HPV, syphilis, HIV)  Yes prior history of chlamydia 5. History of recent travel :  no  Other pertinent information:  no     Review of Systems:10 point review of systems negative unless otherwise noted in HPI  Past Medical History:  Patient Active Problem List   Diagnosis Date Noted  . Premature rupture of membranes 03/25/2019  . Normal vaginal delivery 03/25/2019  . Postpartum care following vaginal delivery 03/25/2019  . Bacterial vaginosis 02/02/2019  . UTI (urinary tract infection) during pregnancy 01/10/2019  . Vaginal discharge during pregnancy 01/10/2019  . Supervision of normal pregnancy 08/31/2018      FAMILY TREE  LAB RESULTS  Language English Pap 08/31/18: neg  Initiated care at [facility] GC/CT Initial:  -/-          36wks:  Dating by LMP  c/w 8wk U/S    Support person [facility] NT/IT: neg    AFP:      MaterniT21: normal female    [place]/HgbE   Flu vaccine Declined 3/24 CF neg  TDaP vaccine 12/28/18  SMA neg  Rhogam  Fragile X neg       Anatomy US Normal female 'Natasha Mead' Blood Type O/Positive/-- (03/24 1603)  Feeding Plan breast Antibody Negative (03/24 1603)  Contraception POPs HBsAg Negative (03/24 1603)  Circumcision n/a RPR Non Reactive (03/24 1603)  Pediatrician [facility] Rubella  1.01 (03/24 1603)  Prenatal Classes declines HIV Non Reactive (03/24  1603)    GTT/A1C Early:      26-28wks:  77/90/97  BTL Consent n/a GBS        [ ]  PCN allergy  VBAC Consent n/a    Waterbirth [ ] Class [ ] Consent [ ] CNM visit PP Needs         . History of Hodgkin's lymphoma 04/23/2015  . Sickle cell trait ([place]) 04/23/2015    Past Surgical History:  Past Surgical History:  Procedure Laterality Date  . DILATION AND EVACUATION N/A 12/21/2015   Procedure: DILATATION AND EVACUATION;  Surgeon: Will Bonnet, MD;  Location: [facility] ORS;  Service: Gynecology;  Laterality: N/A;  . NECK LESION BIOPSY    . OTHER SURGICAL HISTORY N/A 2015   Endoscopy  . PORT-A-CATH REMOVAL    . PORTACATH PLACEMENT Left 2010    Gynecologic History: No LMP recorded.  Obstetric History: [id number]  Family History:  Family History  Problem Relation Age of Onset  . Hypertension Mother   . Diabetes Maternal Grandfather   . Hypertension Maternal Grandfather   . Cancer Maternal Grandfather   . Cancer Maternal Grandmother   . Asthma Sister   . Asthma Daughter     Social History:  Social History   Socioeconomic History  . Marital status: Married    Spouse name: Not on file  . Number of children: 1  . Years of education: Not on file  . Highest education level: Not on file  Occupational History  . Not on file  Tobacco Use  . Smoking status: Never Smoker  . Smokeless tobacco: Never Used  Vaping Use  . Vaping Use: Never used  Substance and Sexual Activity  . Alcohol use: No  . Drug use: No  . Sexual activity: Yes    Partners: Male  Other Topics Concern  . Not on file  Social History Narrative   Right handed      Lives with husband in two story home   Social Determinants of Health   Financial Resource Strain:   . Difficulty of Paying Living Expenses: Not on file  Food Insecurity:   . Worried About Charity fundraiser in the Last Year: Not on file  . Ran Out of Food in the Last Year: Not on file  Transportation Needs:   . Lack of Transportation (Medical):  Not on file  . Lack of Transportation (Non-Medical): Not on file  Physical Activity:   . Days of Exercise per Week: Not on file  . Minutes of Exercise per Session: Not on file  Stress:   . Feeling of Stress : Not on file  Social Connections:   . Frequency of Communication with Friends and Family: Not on file  . Frequency of Social Gatherings with Friends and Family: Not on file  . Attends Religious Services: Not on file  . Active Member of Clubs or  Organizations: Not on file  . Attends Archivist Meetings: Not on file  . Marital Status: Not on file  Intimate Partner Violence:   . Fear of Current or Ex-Partner: Not on file  . Emotionally Abused: Not on file  . Physically Abused: Not on file  . Sexually Abused: Not on file    Allergies:  Allergies  Allergen Reactions  . Other Hives    Oral CT contrast Oral CT contrast  . Diatrizoate Hives    PO only.  Tolerates IV contrast.  . Iodinated Diagnostic Agents Hives  . Nexium [Esomeprazole Magnesium] Swelling  . Cheese Rash and Swelling    Lip swelling    Medications: Prior to Admission medications   Medication Sig Start Date End Date Taking? Authorizing Provider  ferrous sulfate 325 (65 FE) MG tablet Take 1 tablet (325 mg total) by mouth 3 (three) times daily with meals. Patient not taking: Reported on 04/25/2020 02/03/19   Gavin Pound, CNM    Physical Exam Vitals: unknown if currently breastfeeding.  General: NAD HEENT: normocephalic, anicteric Thyroid: no enlargement, no palpable nodules Pulmonary: No increased work of breathing, CTAB Cardiovascular: RRR, distal pulses 2+ Abdomen: NABS, soft, non-tender, non-distended.  Umbilicus without lesions.  No hepatomegaly, splenomegaly or masses palpable. No evidence of hernia  Genitourinary:  External: Normal external female genitalia.  Normal urethral meatus, normal  Bartholin's and Skene's glands.    Vagina: Normal vaginal mucosa, no evidence of prolapse.     Cervix: Grossly normal in appearance, no bleeding  Uterus:  Non-enlarged, mobile, normal contour.  No CMT  Adnexa: ovaries non-enlarged, no adnexal masses  Rectal: deferred Extremities: no edema, erythema, or tenderness Neurologic: Grossly intact Psychiatric: mood appropriate, affect full   Edinburgh Postnatal Depression Scale - 04/25/20 1442      Edinburgh Postnatal Depression Scale:  In the Past 7 Days   I have been able to laugh and see the funny side of things. 2    I have looked forward with enjoyment to things. 2    I have blamed myself unnecessarily when things went wrong. 2    I have been anxious or worried for no good reason. 3    I have felt scared or panicky for no good reason. 2    Things have been getting on top of me. 2    I have been so unhappy that I have had difficulty sleeping. 3    I have felt sad or miserable. 2    I have been so unhappy that I have been crying. 2    The thought of harming myself has occurred to me. 0    Edinburgh Postnatal Depression Scale Total 20          Assessment: 24 y.o. [id number] presenting to initiate prenatal care  Plan: 1) Avoid alcoholic beverages. 2) Patient encouraged not to smoke.  3) Discontinue the use of all non-medicinal drugs and chemicals.  4) Take prenatal vitamins daily. The patient is concerned that prenatal vitamin were somehow associated with her prior miscarriages.  I discussed the importance of folic acid intake in the prevention of spina bifida.  Rx for folic acid 1g po daily written 5) Nutrition, food safety (fish, cheese advisories, and high nitrite foods) and exercise discussed. 6) Hospital and practice style discussed with cross coverage system.  7) Genetic Screening, such as with 1st Trimester Screening, cell free fetal DNA, AFP testing, and Ultrasound, as well as with amniocentesis and CVS as appropriate, is discussed  with patient. At the conclusion of today's visit patient requested genetic testing 8)  Depression/Anxiety - EPDS baseline of 20 today, will start Zoloft 25mg  and titrate up to effect.  Discussed side-effect profile.  To discontinue and contact should acute worsening in mood occur 9) Dating scan ordered  Malachy Mood, MD, [place], [place] [facility] 04/25/2020, 2:37 PM

## 2020-04-26 LAB — RPR+RH+ABO+RUB AB+AB SCR+CB...
Antibody Screen: NEGATIVE
HIV Screen 4th Generation wRfx: NONREACTIVE
Hematocrit: 36.3 % (ref 34.0–46.6)
Hemoglobin: 11.7 g/dL (ref 11.1–15.9)
Hepatitis B Surface Ag: NEGATIVE
MCH: 23.7 pg — ABNORMAL LOW (ref 26.6–33.0)
MCHC: 32.2 g/dL (ref 31.5–35.7)
MCV: 74 fL — ABNORMAL LOW (ref 79–97)
Platelets: 420 10*3/uL (ref 150–450)
RBC: 4.94 x10E6/uL (ref 3.77–5.28)
RDW: 16.1 % — ABNORMAL HIGH (ref 11.7–15.4)
RPR Ser Ql: NONREACTIVE
Rh Factor: POSITIVE
Rubella Antibodies, IGG: <0.9 index — ABNORMAL LOW (ref >0.99)
Varicella zoster IgG: <135 index — ABNORMAL LOW (ref >165)
WBC: 6.2 10*3/uL (ref 3.4–10.8)

## 2020-04-27 LAB — URINE CULTURE: Organism ID, Bacteria: NO GROWTH

## 2020-04-27 LAB — CERVICOVAGINAL ANCILLARY ONLY
Chlamydia: NEGATIVE
Comment: NEGATIVE
Comment: NORMAL
Neisseria Gonorrhea: NEGATIVE

## 2020-05-10 ENCOUNTER — Encounter: Payer: Self-pay | Admitting: Advanced Practice Midwife

## 2020-05-10 ENCOUNTER — Other Ambulatory Visit: Payer: Self-pay

## 2020-05-10 ENCOUNTER — Ambulatory Visit (INDEPENDENT_AMBULATORY_CARE_PROVIDER_SITE_OTHER): Payer: Medicaid Other | Admitting: Advanced Practice Midwife

## 2020-05-10 ENCOUNTER — Other Ambulatory Visit: Payer: Self-pay | Admitting: Obstetrics and Gynecology

## 2020-05-10 ENCOUNTER — Ambulatory Visit (INDEPENDENT_AMBULATORY_CARE_PROVIDER_SITE_OTHER): Payer: Medicaid Other

## 2020-05-10 VITALS — BP 112/74 | Wt 92.0 lb

## 2020-05-10 DIAGNOSIS — Z3689 Encounter for other specified antenatal screening: Secondary | ICD-10-CM | POA: Diagnosis not present

## 2020-05-10 DIAGNOSIS — Z3A08 8 weeks gestation of pregnancy: Secondary | ICD-10-CM

## 2020-05-10 DIAGNOSIS — Z3481 Encounter for supervision of other normal pregnancy, first trimester: Secondary | ICD-10-CM

## 2020-05-10 DIAGNOSIS — F32A Depression, unspecified: Secondary | ICD-10-CM

## 2020-05-10 DIAGNOSIS — O99341 Other mental disorders complicating pregnancy, first trimester: Secondary | ICD-10-CM

## 2020-05-10 DIAGNOSIS — F419 Anxiety disorder, unspecified: Secondary | ICD-10-CM

## 2020-05-10 MED ORDER — ESCITALOPRAM OXALATE 10 MG PO TABS: 10.0000 mg | ORAL_TABLET | Freq: Every day | ORAL | 3 refills | Status: DC

## 2020-05-10 NOTE — Progress Notes (Signed)
Dating scan today. No vb. No lof.

## 2020-05-10 NOTE — Patient Instructions (Signed)
Perinatal Depression When a woman feels excessive sadness, anger, or anxiety during pregnancy or during the first 12 months after she gives birth, she has a condition called perinatal depression. Depression can interfere with work, school, relationships, and other everyday activities. If it is not managed properly, it can also cause problems in the mother and her baby. Sometimes, perinatal depression is left untreated because symptoms are thought to be normal mood swings during and right after pregnancy. If you have symptoms of depression, it is important to talk with your health care provider. What are the causes? The exact cause of this condition is not known. Hormonal changes during and after pregnancy may play a role in causing perinatal depression. What increases the risk? You are more likely to develop this condition if:  You have a personal or family history of depression, anxiety, or mood disorders.  You experience a stressful life event during pregnancy, such as the death of a loved one.  You have a lot of regular life stress.  You do not have support from family members or loved ones, or you are in an abusive relationship. What are the signs or symptoms? Symptoms of this condition include:  Feeling sad or hopeless.  Feelings of guilt.  Feeling irritable or overwhelmed.  Changes in your appetite.  Lack of energy or motivation.  Sleep problems.  Difficulty concentrating or completing tasks.  Loss of interest in hobbies or relationships.  Headaches or stomach problems that do not go away. How is this diagnosed? This condition is diagnosed based on a physical exam and mental evaluation. In some cases, your health care provider may use a depression screening tool. These tools include a list of questions that can help a health care provider diagnose depression. Your health care provider may refer you to a mental health expert who specializes in depression. How is this  treated? This condition may be treated with:  Medicines. Your health care provider will only give you medicines that have been proven safe for pregnancy and breastfeeding.  Talk therapy with a mental health professional to help change your patterns of thinking (cognitive behavioral therapy).  Support groups.  Brain stimulation or light therapies.  Stress reduction therapies, such as mindfulness. Follow these instructions at home: Lifestyle  Do not use any products that contain nicotine or tobacco, such as cigarettes and e-cigarettes. If you need help quitting, ask your health care provider.  Do not use alcohol when you are pregnant. After your baby is born, limit alcohol intake to no more than 1 drink a day. One drink equals 12 oz of beer, 5 oz of wine, or 1 oz of hard liquor.  Consider joining a support group for new mothers. Ask your health care provider for recommendations.  Take good care of yourself. Make sure you: ? Get plenty of sleep. If you are having trouble sleeping, talk with your health care provider. ? Eat a healthy diet. This includes plenty of fruits and vegetables, whole grains, and lean proteins. ? Exercise regularly, as told by your health care provider. Ask your health care provider what exercises are safe for you. General instructions  Take over-the-counter and prescription medicines only as told by your health care provider.  Talk with your partner or family members about your feelings during pregnancy. Share any concerns or anxieties that you may have.  Ask for help with tasks or chores when you need it. Ask friends and family members to provide meals, watch your children, or help with   cleaning.  Keep all follow-up visits as told by your health care provider. This is important. Contact a health care provider if:  You (or people close to you) notice that you have any symptoms of depression.  You have depression and your symptoms get worse.  You  experience side effects from medicines, such as nausea or sleep problems. Get help right away if:  You feel like hurting yourself, your baby, or someone else. If you ever feel like you may hurt yourself or others, or have thoughts about taking your own life, get help right away. You can go to your nearest emergency department or call:  Your local emergency services (911 in the [place]  A suicide crisis helpline, such as the [place] at (857)543-9327. This is open 24 hours a day. Summary  Perinatal depression is when a woman feels excessive sadness, anger, or anxiety during pregnancy or during the first 12 months after she gives birth.  If perinatal depression is not treated, it can lead to health problems for the mother and her baby.  This condition is treated with medicines, talk therapy, stress reduction therapies, or a combination of two or more treatments.  Talk with your partner or family members about your feelings. Do not be afraid to ask for help. This information is not intended to replace advice given to you by your health care provider. Make sure you discuss any questions you have with your health care provider. Document Revised: 11/10/2018 Document Reviewed: 07/23/2016 Elsevier Patient Education  [place] When a woman feels excessive tension or worry (anxiety) during pregnancy or during the first 12 months after she gives birth, she has a condition called perinatal anxiety. Anxiety can interfere with work, school, relationships, and other everyday activities. If it is not managed properly, it can also cause problems in the mother and her baby.  If you are pregnant and you have symptoms of an anxiety disorder, it is important to talk with your health care provider. What are the causes? The exact cause of this condition is not known. Hormonal changes during and after pregnancy may play a role in causing perinatal  anxiety. What increases the risk? You are more likely to develop this condition if:  You have a personal or family history of depression, anxiety, or mood disorders.  You experience a stressful life event during pregnancy, such as the death of a loved one.  You have a lot of regular life stress, such as being a single parent.  You have thyroid problems. What are the signs or symptoms? Perinatal anxiety can be different for everyone. It may include:  Panic attacks (panic disorder). These are intense episodes of fear or discomfort that may also cause sweating, nausea, shortness of breath, or fear of dying. They usually last 5-15 minutes.  Reliving an upsetting (traumatic) event through distressing thoughts, dreams, or flashbacks (post-traumatic stress disorder, or PTSD).  Excessive worry about multiple problems (generalized anxiety disorder).  Fear and stress about leaving certain people or loved ones (separation anxiety).  Performing repetitive tasks (compulsions) to relieve stress or worry (obsessive compulsive disorder, or OCD).  Fear of certain objects or situations (phobias).  Excessive worrying, such as a constant feeling that something bad is going to happen.  Inability to relax.  Difficulty concentrating.  Sleep problems.  Frequent nightmares or disturbing thoughts. How is this diagnosed? This condition is diagnosed based on a physical exam and mental evaluation. In some cases, your health care  provider may use an anxiety screening tool. These tools include a list of questions that can help a health care provider diagnose anxiety. Your health care provider may refer you to a mental health expert who specializes in anxiety. How is this treated? This condition may be treated with:  Medicines. Your health care provider will only give you medicines that have been proven safe for pregnancy and breastfeeding.  Talk therapy with a mental health professional to help change  your patterns of thinking (cognitive behavioral therapy).  Mindfulness-based stress reduction.  Other relaxation therapies, such as deep breathing or guided muscle relaxation.  Support groups. Follow these instructions at home: Lifestyle  Do not use any products that contain nicotine or tobacco, such as cigarettes and e-cigarettes. If you need help quitting, ask your health care provider.  Do not use alcohol when you are pregnant. After your baby is born, limit alcohol intake to no more than 1 drink a day. One drink equals 12 oz of beer, 5 oz of wine, or 1 oz of hard liquor.  Consider joining a support group for new mothers. Ask your health care provider for recommendations.  Take good care of yourself. Make sure you: ? Get plenty of sleep. If you are having trouble sleeping, talk with your health care provider. ? Eat a healthy diet. This includes plenty of fruits and vegetables, whole grains, and lean proteins. ? Exercise regularly, as told by your health care provider. Ask your health care provider what exercises are safe for you. General instructions  Take over-the-counter and prescription medicines only as told by your health care provider.  Talk with your partner or family members about your feelings during pregnancy. Share any concerns or fears that you may have.  Ask for help with tasks or chores when you need it. Ask friends and family members to provide meals, watch your children, or help with cleaning.  Keep all follow-up visits as told by your health care provider. This is important. Contact a health care provider if:  You (or people close to you) notice that you have any symptoms of anxiety or depression.  You have anxiety and your symptoms get worse.  You experience side effects from medicines, such as nausea or sleep problems. Get help right away if:  You feel like hurting yourself, your baby, or someone else. If you ever feel like you may hurt yourself or  others, or have thoughts about taking your own life, get help right away. You can go to your nearest emergency department or call:  Your local emergency services (911 in the [place]  A suicide crisis helpline, such as the [place] at 905-658-0743. This is open 24 hours a day. Summary  Perinatal anxiety is when a woman feels excessive tension or worry during pregnancy or during the first 12 months after she gives birth.  Perinatal anxiety may include panic attacks, post-traumatic stress disorder, separation anxiety, phobias, or generalized anxiety.  Perinatal anxiety can cause physical health problems in the mother and baby if not properly managed.  This condition is treated with medicines, talk therapy, stress reduction therapies, or a combination of two or more treatments.  Talk with your partner or family members about your concerns or fears. Do not be afraid to ask for help. This information is not intended to replace advice given to you by your health care provider. Make sure you discuss any questions you have with your health care provider. Document Revised: 05/29/2017 Document Reviewed: 07/23/2016  Elsevier Patient Education  [employer].

## 2020-05-10 NOTE — Progress Notes (Signed)
  Routine Prenatal Care Visit  Subjective  Michele Alexander is a 24 y.o. [phone] at [redacted]w[redacted]d being seen today for ongoing prenatal care.  She is currently monitored for the following issues for this low-risk pregnancy and has History of Hodgkin's lymphoma; Sickle cell trait ([place]); Supervision of normal pregnancy; and History of postpartum depression, currently pregnant on their problem list.  ----------------------------------------------------------------------------------- Patient reports worsening anxiety and depression since being on zoloft that she started 2 weeks ago. She has been in bed every day all day except rare times up to care for her children. She would like to try a different medication.    . Vag. Bleeding: None.   . Leaking Fluid denies.  ----------------------------------------------------------------------------------- The following portions of the patient's history were reviewed and updated as appropriate: allergies, current medications, past family history, past medical history, past social history, past surgical history and problem list. Problem list updated.  Objective  Blood pressure 112/74, weight 92 lb (41.7 kg), last menstrual period 03/09/2020, unknown if currently breastfeeding. Pregravid weight 88 lb (39.9 kg) Total Weight Gain 4 lb (1.814 kg) Urinalysis: Urine Protein    Urine Glucose    Fetal Status: Fetal Heart Rate (bpm): 171          Dating scan: 9 weeks 1 day, no adjustment of EDD for 2 day difference  General:  Alert, oriented and cooperative. Patient is in no acute distress.  Skin: Skin is warm and dry. No rash noted.   Cardiovascular: Normal heart rate noted  Respiratory: Normal respiratory effort, no problems with respiration noted  Abdomen: Soft, gravid, appropriate for gestational age. Pain/Pressure: Absent     Pelvic:  Cervical exam deferred        Extremities: Normal range of motion.     Mental Status: Normal mood and affect. Normal behavior. Normal  judgment and thought content.   Assessment   24 y.o. [id number] at [redacted]w[redacted]d by  12/14/2020, by Last Menstrual Period presenting for routine prenatal visit  Plan   pregnancy #8 Problems (from 04/25/20 to present)    No problems associated with this episode.       Preterm labor symptoms and general obstetric precautions including but not limited to vaginal bleeding, contractions, leaking of fluid and fetal movement were reviewed in detail with the patient. Please refer to After Visit Summary for other counseling recommendations.   Return in about 8 days (around 05/18/2020) for rob.  Rod Can, CNM 05/10/2020 2:19 PM

## 2020-05-18 ENCOUNTER — Encounter: Payer: Self-pay | Admitting: Obstetrics and Gynecology

## 2020-05-18 ENCOUNTER — Other Ambulatory Visit: Payer: Self-pay

## 2020-05-18 ENCOUNTER — Ambulatory Visit (INDEPENDENT_AMBULATORY_CARE_PROVIDER_SITE_OTHER): Payer: Medicaid Other | Admitting: Obstetrics and Gynecology

## 2020-05-18 VITALS — BP 98/50 | Wt 88.0 lb

## 2020-05-18 DIAGNOSIS — Z1379 Encounter for other screening for genetic and chromosomal anomalies: Secondary | ICD-10-CM

## 2020-05-18 DIAGNOSIS — F419 Anxiety disorder, unspecified: Secondary | ICD-10-CM

## 2020-05-18 DIAGNOSIS — F32A Depression, unspecified: Secondary | ICD-10-CM

## 2020-05-18 DIAGNOSIS — O99341 Other mental disorders complicating pregnancy, first trimester: Secondary | ICD-10-CM

## 2020-05-18 DIAGNOSIS — Z23 Encounter for immunization: Secondary | ICD-10-CM | POA: Diagnosis not present

## 2020-05-18 DIAGNOSIS — Z7185 Encounter for immunization safety counseling: Secondary | ICD-10-CM

## 2020-05-18 DIAGNOSIS — Z3A10 10 weeks gestation of pregnancy: Secondary | ICD-10-CM

## 2020-05-18 DIAGNOSIS — R634 Abnormal weight loss: Secondary | ICD-10-CM

## 2020-05-18 DIAGNOSIS — Z3481 Encounter for supervision of other normal pregnancy, first trimester: Secondary | ICD-10-CM

## 2020-05-18 LAB — POCT URINALYSIS DIPSTICK OB
Glucose, UA: NEGATIVE
POC,PROTEIN,UA: NEGATIVE

## 2020-05-18 NOTE — Progress Notes (Signed)
Routine Prenatal Care Visit  Subjective  Michele Alexander is a 24 y.o. [phone] at [redacted]w[redacted]d being seen today for ongoing prenatal care.  She is currently monitored for the following issues for this low-risk pregnancy and has History of Hodgkin's lymphoma; Sickle cell trait ([place]); Supervision of normal pregnancy; and History of postpartum depression, currently pregnant on their problem list.  ----------------------------------------------------------------------------------- Patient reports stomach upset and cramping after meals since starting Lexapro. Otherwise, patient reports a decrease in depressive sx and is happy with her current medication regimen.    .  .   . Denies leaking of fluid.  ----------------------------------------------------------------------------------- The following portions of the patient's history were reviewed and updated as appropriate: allergies, current medications, past family history, past medical history, past social history, past surgical history and problem list. Problem list updated.   Objective  Blood pressure (!) 98/50, weight 88 lb (39.9 kg), last menstrual period 03/09/2020, unknown if currently breastfeeding. Pregravid weight 88 lb (39.9 kg) Total Weight Gain 0 lb (0 kg) Urinalysis:      Fetal Status: Fetal Heart Rate (bpm): 150         General:  Alert, oriented and cooperative. Patient is in no acute distress.  Skin: Skin is warm and dry. No rash noted.   Cardiovascular: Normal heart rate noted  Respiratory: Normal respiratory effort, no problems with respiration noted  Abdomen: Soft, gravid, appropriate for gestational age.       Pelvic:  Cervical exam deferred        Extremities: Normal range of motion.     ental Status: Normal mood and affect. Normal behavior. Normal judgment and thought content.     Assessment   24 y.o. [id number] at [redacted]w[redacted]d by  12/14/2020, by Last Menstrual Period presenting for routine prenatal visit  Plan   pregnancy #8  Problems (from 04/25/20 to present)    Problem Noted Resolved   Supervision of normal pregnancy 08/31/2018 by Octaviano Glow, RN No   Overview Addendum 05/10/2020  2:24 PM by Rod Can, CNM       [facility]  LAB RESULTS  Language English Pap 08/31/18: neg  Initiated care at 9wk GC/CT Initial:  -/-          36wks:  Dating by LMP c/w 9wk U/S    Support person [facility]  MaterniT21: normal female    [place]/HgbE   Flu vaccine Declined 3/24 CF neg  TDaP vaccine 12/28/18  SMA neg  Rhogam  Fragile X neg       Anatomy US  Blood Type O/Positive/-- (03/24 1603)  Feeding Plan breast Antibody Negative (03/24 1603)  Contraception POPs HBsAg Negative (03/24 1603)  Circumcision n/a RPR Non Reactive (03/24 1603)  Pediatrician [facility]  1.01 (03/24 1603)  Prenatal Classes declines HIV Non Reactive (03/24 1603)    GTT/A1C Early:      26-28wks:  77/90/97  BTL Consent n/a GBS        [ ]  PCN allergy  VBAC Consent n/a      PP Needs            Previous Version      -Depressive sx improved with current lexapro dose - wt loss and GI sx - will check thyroid function -NIPTs today  Gestational age appropriate obstetric precautions including but not limited to vaginal bleeding, contractions, leaking of fluid and fetal movement were reviewed in detail with the patient.    Return in about 2 weeks (around 06/01/2020) for  [place], CNM, MSN [facility] 05/18/2020, 4:04 PM

## 2020-05-19 LAB — TSH+FREE T4
Free T4: 1.01 ng/dL (ref 0.82–1.77)
TSH: 1.22 u[IU]/mL (ref 0.450–4.500)

## 2020-05-24 LAB — MATERNIT 21 PLUS CORE, BLOOD
Fetal Fraction: 10
Result (T21): NEGATIVE
Trisomy 13 (Patau syndrome): NEGATIVE
Trisomy 18 (Edwards syndrome): NEGATIVE
Trisomy 21 (Down syndrome): NEGATIVE

## 2020-06-04 ENCOUNTER — Other Ambulatory Visit: Payer: Self-pay

## 2020-06-04 ENCOUNTER — Encounter: Payer: Self-pay | Admitting: Obstetrics & Gynecology

## 2020-06-04 ENCOUNTER — Ambulatory Visit (INDEPENDENT_AMBULATORY_CARE_PROVIDER_SITE_OTHER): Payer: Medicaid Other | Admitting: Obstetrics & Gynecology

## 2020-06-04 VITALS — BP 110/60 | Wt 92.0 lb

## 2020-06-04 DIAGNOSIS — Z3481 Encounter for supervision of other normal pregnancy, first trimester: Secondary | ICD-10-CM

## 2020-06-04 DIAGNOSIS — Z3689 Encounter for other specified antenatal screening: Secondary | ICD-10-CM

## 2020-06-04 DIAGNOSIS — Z3A12 12 weeks gestation of pregnancy: Secondary | ICD-10-CM

## 2020-06-04 LAB — POCT URINALYSIS DIPSTICK OB
Glucose, UA: NEGATIVE
POC,PROTEIN,UA: NEGATIVE

## 2020-06-04 NOTE — Patient Instructions (Signed)
Thank you for choosing [facility]. As part of our ongoing efforts to improve patient experience, we would appreciate your feedback. Please fill out the short survey that you will receive by mail or MyChart. Your opinion is important to us! -Dr Bascom Biel  Second Trimester of Pregnancy The second trimester is from week 14 through week 27 (months 4 through 6). The second trimester is often a time when you feel your best. Your body has adjusted to being pregnant, and you begin to feel better physically. Usually, morning sickness has lessened or quit completely, you may have more energy, and you may have an increase in appetite. The second trimester is also a time when the fetus is growing rapidly. At the end of the sixth month, the fetus is about 9 inches long and weighs about 1 pounds. You will likely begin to feel the baby move (quickening) between 16 and 20 weeks of pregnancy. Body changes during your second trimester Your body continues to go through many changes during your second trimester. The changes vary from woman to woman.  Your weight will continue to increase. You will notice your lower abdomen bulging out.  You may begin to get stretch marks on your hips, abdomen, and breasts.  You may develop headaches that can be relieved by medicines. The medicines should be approved by your health care provider.  You may urinate more often because the fetus is pressing on your bladder.  You may develop or continue to have heartburn as a result of your pregnancy.  You may develop constipation because certain hormones are causing the muscles that push waste through your intestines to slow down.  You may develop hemorrhoids or swollen, bulging veins (varicose veins).  You may have back pain. This is caused by: ? Weight gain. ? Pregnancy hormones that are relaxing the joints in your pelvis. ? A shift in weight and the muscles that support your balance.  Your breasts will continue to grow and  they will continue to become tender.  Your gums may bleed and may be sensitive to brushing and flossing.  Dark spots or blotches (chloasma, mask of pregnancy) may develop on your face. This will likely fade after the baby is born.  A dark line from your belly button to the pubic area (linea nigra) may appear. This will likely fade after the baby is born.  You may have changes in your hair. These can include thickening of your hair, rapid growth, and changes in texture. Some women also have hair loss during or after pregnancy, or hair that feels dry or thin. Your hair will most likely return to normal after your baby is born. What to expect at prenatal visits During a routine prenatal visit:  You will be weighed to make sure you and the fetus are growing normally.  Your blood pressure will be taken.  Your abdomen will be measured to track your baby's growth.  The fetal heartbeat will be listened to.  Any test results from the previous visit will be discussed. Your health care provider may ask you:  How you are feeling.  If you are feeling the baby move.  If you have had any abnormal symptoms, such as leaking fluid, bleeding, severe headaches, or abdominal cramping.  If you are using any tobacco products, including cigarettes, chewing tobacco, and electronic cigarettes.  If you have any questions. Other tests that may be performed during your second trimester include:  Blood tests that check for: ? Low iron levels (  anemia). ? High blood sugar that affects pregnant women (gestational diabetes) between 24 and 28 weeks. ? Rh antibodies. This is to check for a protein on red blood cells (Rh factor).  Urine tests to check for infections, diabetes, or protein in the urine.  An ultrasound to confirm the proper growth and development of the baby.  An amniocentesis to check for possible genetic problems.  Fetal screens for spina bifida and Down syndrome.  HIV (human  immunodeficiency virus) testing. Routine prenatal testing includes screening for HIV, unless you choose not to have this test. Follow these instructions at home: Medicines  Follow your health care provider's instructions regarding medicine use. Specific medicines may be either safe or unsafe to take during pregnancy.  Take a prenatal vitamin that contains at least 600 micrograms (mcg) of folic acid.  If you develop constipation, try taking a stool softener if your health care provider approves. Eating and drinking   Eat a balanced diet that includes fresh fruits and vegetables, whole grains, good sources of protein such as meat, eggs, or tofu, and low-fat dairy. Your health care provider will help you determine the amount of weight gain that is right for you.  Avoid raw meat and uncooked cheese. These carry germs that can cause birth defects in the baby.  If you have low calcium intake from food, talk to your health care provider about whether you should take a daily calcium supplement.  Limit foods that are high in fat and processed sugars, such as fried and sweet foods.  To prevent constipation: ? Drink enough fluid to keep your urine clear or pale yellow. ? Eat foods that are high in fiber, such as fresh fruits and vegetables, whole grains, and beans. Activity  Exercise only as directed by your health care provider. Most women can continue their usual exercise routine during pregnancy. Try to exercise for 30 minutes at least 5 days a week. Stop exercising if you experience uterine contractions.  Avoid heavy lifting, wear low heel shoes, and practice good posture.  A sexual relationship may be continued unless your health care provider directs you otherwise. Relieving pain and discomfort  Wear a good support bra to prevent discomfort from breast tenderness.  Take warm sitz baths to soothe any pain or discomfort caused by hemorrhoids. Use hemorrhoid cream if your health care  provider approves.  Rest with your legs elevated if you have leg cramps or low back pain.  If you develop varicose veins, wear support hose. Elevate your feet for 15 minutes, 3-4 times a day. Limit salt in your diet. Prenatal Care  Write down your questions. Take them to your prenatal visits.  Keep all your prenatal visits as told by your health care provider. This is important. Safety  Wear your seat belt at all times when driving.  Make a list of emergency phone numbers, including numbers for family, friends, the hospital, and police and fire departments. General instructions  Ask your health care provider for a referral to a local prenatal education class. Begin classes no later than the beginning of month 6 of your pregnancy.  Ask for help if you have counseling or nutritional needs during pregnancy. Your health care provider can offer advice or refer you to specialists for help with various needs.  Do not use hot tubs, steam rooms, or saunas.  Do not douche or use tampons or scented sanitary pads.  Do not cross your legs for long periods of time.  Avoid cat litter   boxes and soil used by cats. These carry germs that can cause birth defects in the baby and possibly loss of the fetus by miscarriage or stillbirth.  Avoid all smoking, herbs, alcohol, and unprescribed drugs. Chemicals in these products can affect the formation and growth of the baby.  Do not use any products that contain nicotine or tobacco, such as cigarettes and e-cigarettes. If you need help quitting, ask your health care provider.  Visit your dentist if you have not gone yet during your pregnancy. Use a soft toothbrush to brush your teeth and be gentle when you floss. Contact a health care provider if:  You have dizziness.  You have mild pelvic cramps, pelvic pressure, or nagging pain in the abdominal area.  You have persistent nausea, vomiting, or diarrhea.  You have a bad smelling vaginal  discharge.  You have pain when you urinate. Get help right away if:  You have a fever.  You are leaking fluid from your vagina.  You have spotting or bleeding from your vagina.  You have severe abdominal cramping or pain.  You have rapid weight gain or weight loss.  You have shortness of breath with chest pain.  You notice sudden or extreme swelling of your face, hands, ankles, feet, or legs.  You have not felt your baby move in over an hour.  You have severe headaches that do not go away when you take medicine.  You have vision changes. Summary  The second trimester is from week 14 through week 27 (months 4 through 6). It is also a time when the fetus is growing rapidly.  Your body goes through many changes during pregnancy. The changes vary from woman to woman.  Avoid all smoking, herbs, alcohol, and unprescribed drugs. These chemicals affect the formation and growth your baby.  Do not use any tobacco products, such as cigarettes, chewing tobacco, and e-cigarettes. If you need help quitting, ask your health care provider.  Contact your health care provider if you have any questions. Keep all prenatal visits as told by your health care provider. This is important. This information is not intended to replace advice given to you by your health care provider. Make sure you discuss any questions you have with your health care provider. Document Revised: 09/17/2018 Document Reviewed: 07/01/2016 Elsevier Patient Education  2020 [employer]vier Inc.  

## 2020-06-04 NOTE — Progress Notes (Signed)
°  Subjective  Pt denies pain, bleeding.  Pt has mild nausea at times. Pt feels improved w anxiety and depression on Lexapro.  Objective  BP 110/60    Wt 92 lb (41.7 kg)    LMP 03/09/2020 (Exact Date)    BMI 18.58 kg/m  General: NAD Pumonary: no increased work of breathing Abdomen: gravid, non-tender Extremities: no edema Psychiatric: mood appropriate, affect full  Assessment  24 y.o. [id number] at [redacted]w[redacted]d by  12/14/2020, by Last Menstrual Period presenting for routine prenatal visit  Plan   Problem List Items Addressed This Visit      Other   Supervision of normal pregnancy - Primary    Other Visit Diagnoses    [redacted] weeks gestation of pregnancy       Screening, antenatal, for fetal anatomic survey       Relevant Orders   US OB Comp + 14 Wk      pregnancy #8 Problems (from 04/25/20 to present)    Problem Noted Resolved   Supervision of normal pregnancy 08/31/2018 by Moss Mc, RN No   Overview Addendum 06/04/2020  1:47 PM by Nadara Mustard, MD      [facility]  LAB RESULTS  Language English Pap 08/31/18: neg  Initiated care at [facility] GC/CT Initial:  -/-          36wks:  Dating by LMP c/w 9wk U/S    Support person [facility] NT/IT:     AFP:      MaterniT21: normal female    /HgbE   Flu vaccine Declined 3/24 CF neg  TDaP vaccine 12/28/18  SMA neg  Rhogam  Fragile X neg       Anatomy US  Blood Type O/Positive/-- (03/24 1603)  Feeding Plan breast Antibody Negative (03/24 1603)  Contraception POPs HBsAg Negative (03/24 1603)  Circumcision n/a RPR Non Reactive (03/24 1603)  Pediatrician [facility] Rubella  1.01 (03/24 1603)  Prenatal Classes declines HIV Non Reactive (03/24 1603)    GTT/A1C Early:      26-28wks:  77/90/97  BTL Consent n/a GBS        [ ]  PCN allergy  VBAC Consent n/a    Waterbirth  PP Needs            Previous Version     PNV  scheduled 20 weeks  f/u RON 16 weeks  NIPT normal discussed.  This is patients third daughter  [place], MD,  [facility], [facility] 06/04/2020  1:58 PM

## 2020-06-04 NOTE — Addendum Note (Signed)
Addended by: Cornelius Moras D on: 06/04/2020 02:01 PM   Modules accepted: Orders

## 2020-06-09 NOTE — L&D Delivery Note (Signed)
Delivery Note At  1131,a viable female was delivered vaginally(Presentation:   LOA with restitution to LOT.   ).  APGAR: 8, 9; weight  .pending. At approximately 1130, with the patient pushing involuntarily, a small forebag was ruptured with an amnihook, and the vertex presented at the introitus. Easy delivery of the head, followed by a compound hand (anterior). The anterior shoulder delivered spontaneously, and with gentle upward guidance, the posterior followed. The perinuem was intact. A body cord was noted and quickly reduced as the torso delivered.  Spontaneous respirations noted, and the baby was placed immediately skin to skin for drying and bonding.    Placenta status:  intact,delivered at 1136  .  Cord:   3 vessel.with the following complications:  none.  Cord pH: NA  Anesthesia:  none Episiotomy:  none Lacerations:  none Suture Repair:  NA Est. Blood Loss (mL):  150  Mom to postpartum.  Baby to Couplet care / Skin to Skin.  Imagene Riches 12/05/2020, 12:12 PM

## 2020-07-02 ENCOUNTER — Ambulatory Visit (INDEPENDENT_AMBULATORY_CARE_PROVIDER_SITE_OTHER): Payer: Medicaid Other | Admitting: Advanced Practice Midwife

## 2020-07-02 ENCOUNTER — Other Ambulatory Visit: Payer: Self-pay

## 2020-07-02 ENCOUNTER — Encounter: Payer: Self-pay | Admitting: Advanced Practice Midwife

## 2020-07-02 ENCOUNTER — Telehealth: Payer: Self-pay

## 2020-07-02 VITALS — Ht 59.0 in | Wt 91.0 lb

## 2020-07-02 DIAGNOSIS — Z3A16 16 weeks gestation of pregnancy: Secondary | ICD-10-CM

## 2020-07-02 DIAGNOSIS — Z369 Encounter for antenatal screening, unspecified: Secondary | ICD-10-CM

## 2020-07-02 DIAGNOSIS — O99012 Anemia complicating pregnancy, second trimester: Secondary | ICD-10-CM

## 2020-07-02 DIAGNOSIS — Z3482 Encounter for supervision of other normal pregnancy, second trimester: Secondary | ICD-10-CM

## 2020-07-02 DIAGNOSIS — Z8571 Personal history of Hodgkin lymphoma: Secondary | ICD-10-CM

## 2020-07-02 DIAGNOSIS — D573 Sickle-cell trait: Secondary | ICD-10-CM

## 2020-07-02 DIAGNOSIS — O98513 Other viral diseases complicating pregnancy, third trimester: Secondary | ICD-10-CM | POA: Diagnosis not present

## 2020-07-02 DIAGNOSIS — U071 COVID-19: Secondary | ICD-10-CM

## 2020-07-02 DIAGNOSIS — Z8759 Personal history of other complications of pregnancy, childbirth and the puerperium: Secondary | ICD-10-CM

## 2020-07-02 NOTE — Progress Notes (Signed)
Phone visit. Pt is Positive for Covid. Pt states she has been having some cramping.

## 2020-07-02 NOTE — Telephone Encounter (Signed)
Pt calling after hour nurse 07/01/20 4:12 pm c/o exposed to covid; calling to resch appt; wants adv on what to do b/c she is a high risk preg; experiencing fever of 99.6, sneezing, coughing, H/A, and body aches; sxs started 07/01/20  [phone]  Left detailed msg we can make her appt for today a telephone visit; TN aware to change appt to telephone;  and to call me back as to what to do.

## 2020-07-02 NOTE — Progress Notes (Signed)
Routine Prenatal Care Visit- Virtual Visit  Subjective   Virtual Visit via Telephone Note  I connected with Michele Alexander on 07/02/20 at  3:15 PM EST by telephone and verified that I am speaking with the correct person using two identifiers.   I discussed the limitations, risks, security and privacy concerns of performing an evaluation and management service by telephone and the availability of in person appointments. I also discussed with the patient that there may be a patient responsible charge related to this service. The patient expressed understanding and agreed to proceed.  The patient was at home I spoke with the patient from my  Office phone The names of people involved in this encounter were: Michele Alexander and myself Rod Can, CNM  Michele Alexander is a 25 y.o. [phone] at [redacted]w[redacted]d being seen today for ongoing prenatal care.  She is currently monitored for the following issues for this low-risk pregnancy and has History of Hodgkin's lymphoma; Sickle cell trait ([place]); Supervision of normal pregnancy; and History of postpartum depression, currently pregnant on their problem list.  ----------------------------------------------------------------------------------- Patient reports current Covid infection with body aches, fever, sore throat, cough. Everyone in the house has Covid. They have family supporting them. She has some lower abdominal cramping that she thinks is round ligament pain.  She has been having trouble sleeping.   . Vag. Bleeding: None.  Movement: Present. Denies leaking of fluid.  ----------------------------------------------------------------------------------- The following portions of the patient's history were reviewed and updated as appropriate: allergies, current medications, past family history, past medical history, past social history, past surgical history and problem list. Problem list updated.   Objective  Height 4\' 11"  (1.499 m), weight 91 lb (41.3  kg), last menstrual period 03/09/2020 Pregravid weight 88 lb (39.9 kg) Total Weight Gain 3 lb (1.361 kg) Urinalysis:      Fetal Status:     Movement: Present     Physical Exam could not be performed. Because of the COVID-19 outbreak this visit was performed over the phone and not in person.   Assessment   25 y.o. [id number] at [redacted]w[redacted]d by  12/14/2020, by Last Menstrual Period presenting for routine prenatal visit  Plan   pregnancy #8 Problems (from 04/25/20 to present)    Problem Noted Resolved   Supervision of normal pregnancy 08/31/2018 by Octaviano Glow, RN No   Overview Addendum 06/04/2020  1:47 PM by Gae Dry, MD      [facility]  LAB RESULTS  Language English Pap 08/31/18: neg  Initiated care at [facility] GC/CT Initial:  -/-          36wks:  Dating by LMP c/w 9wk U/S    Support person [facility] NT/IT:     AFP:      MaterniT21: normal female    [employer]/HgbE   Flu vaccine Declined 3/24 CF neg  TDaP vaccine 12/28/18  SMA neg  Rhogam  Fragile X neg       Anatomy US  Blood Type O/Positive/-- (03/24 1603)  Feeding Plan breast Antibody Negative (03/24 1603)  Contraception POPs HBsAg Negative (03/24 1603)  Circumcision n/a RPR Non Reactive (03/24 1603)  Pediatrician [facility] Rubella  1.01 (03/24 1603)  Prenatal Classes declines HIV Non Reactive (03/24 1603)    GTT/A1C Early:      26-28wks:  77/90/97  BTL Consent n/a GBS        [ ]  PCN allergy  VBAC Consent n/a    Waterbirth  PP Needs  Previous Version       Gestational age appropriate obstetric precautions including but not limited to vaginal bleeding, contractions, leaking of fluid and fetal movement were reviewed in detail with the patient.     Follow Up Instructions: Stay well hydrated Sleep: unisom or benadryl OTC remedies for Covid symptoms Go to ER for severe symptoms   I discussed the assessment and treatment plan with the patient. The patient was provided an opportunity to ask questions and all were  answered. The patient agreed with the plan and demonstrated an understanding of the instructions.   The patient was advised to call back or seek an in-person evaluation if the symptoms worsen or if the condition fails to improve as anticipated.  I provided 15 minutes of non-face-to-face time during this encounter.  Return in about 2 weeks (around 07/16/2020) for anatomy scan and rob .   Rod Can, [facility] 07/02/2020, 3:34 PM

## 2020-07-03 ENCOUNTER — Encounter: Payer: Medicaid Other | Admitting: Obstetrics and Gynecology

## 2020-07-17 ENCOUNTER — Encounter: Payer: Self-pay | Admitting: Obstetrics and Gynecology

## 2020-07-17 ENCOUNTER — Ambulatory Visit (INDEPENDENT_AMBULATORY_CARE_PROVIDER_SITE_OTHER): Payer: Medicaid Other

## 2020-07-17 ENCOUNTER — Other Ambulatory Visit: Payer: Self-pay

## 2020-07-17 ENCOUNTER — Ambulatory Visit (INDEPENDENT_AMBULATORY_CARE_PROVIDER_SITE_OTHER): Payer: Medicaid Other | Admitting: Obstetrics and Gynecology

## 2020-07-17 VITALS — BP 112/68 | Ht 59.0 in | Wt 92.6 lb

## 2020-07-17 DIAGNOSIS — Z3A18 18 weeks gestation of pregnancy: Secondary | ICD-10-CM

## 2020-07-17 DIAGNOSIS — Z3482 Encounter for supervision of other normal pregnancy, second trimester: Secondary | ICD-10-CM

## 2020-07-17 DIAGNOSIS — Z369 Encounter for antenatal screening, unspecified: Secondary | ICD-10-CM | POA: Diagnosis not present

## 2020-07-17 DIAGNOSIS — M549 Dorsalgia, unspecified: Secondary | ICD-10-CM

## 2020-07-17 DIAGNOSIS — M5431 Sciatica, right side: Secondary | ICD-10-CM

## 2020-07-17 DIAGNOSIS — O99891 Other specified diseases and conditions complicating pregnancy: Secondary | ICD-10-CM

## 2020-07-17 NOTE — Patient Instructions (Addendum)
Second Trimester of Pregnancy  The second trimester of pregnancy is from week 13 through week 27. This is months 4 through 6 of pregnancy. The second trimester is often a time when you feel your best. Your body has adjusted to being pregnant, and you begin to feel better physically. During the second trimester:  Morning sickness has lessened or stopped completely.  You may have more energy.  You may have an increase in appetite. The second trimester is also a time when the unborn baby (fetus) is growing rapidly. At the end of the sixth month, the fetus may be up to 12 inches long and weigh about 1 pounds. You will likely begin to feel the baby move (quickening) between 16 and 20 weeks of pregnancy. Body changes during your second trimester Your body continues to go through many changes during your second trimester. The changes vary and generally return to normal after the baby is born. Physical changes  Your weight will continue to increase. You will notice your lower abdomen bulging out.  You may begin to get stretch marks on your hips, abdomen, and breasts.  Your breasts will continue to grow and to become tender.  Dark spots or blotches (chloasma or mask of pregnancy) may develop on your face.  A dark line from your belly button to the pubic area (linea nigra) may appear.  You may have changes in your hair. These can include thickening of your hair, rapid growth, and changes in texture. Some people also have hair loss during or after pregnancy, or hair that feels dry or thin. Health changes  You may develop headaches.  You may have heartburn.  You may develop constipation.  You may develop hemorrhoids or swollen, bulging veins (varicose veins).  Your gums may bleed and may be sensitive to brushing and flossing.  You may urinate more often because the fetus is pressing on your bladder.  You may have back pain. This is caused by: ? Weight gain. ? Pregnancy hormones that  are relaxing the joints in your pelvis. ? A shift in weight and the muscles that support your balance. Follow these instructions at home: Medicines  Follow your health care provider's instructions regarding medicine use. Specific medicines may be either safe or unsafe to take during pregnancy. Do not take any medicines unless approved by your health care provider.  Take a prenatal vitamin that contains at least 600 micrograms (mcg) of folic acid. Eating and drinking  Eat a healthy diet that includes fresh fruits and vegetables, whole grains, good sources of protein such as meat, eggs, or tofu, and low-fat dairy products.  Avoid raw meat and unpasteurized juice, milk, and cheese. These carry germs that can harm you and your baby.  You may need to take these actions to prevent or treat constipation: ? Drink enough fluid to keep your urine pale yellow. ? Eat foods that are high in fiber, such as beans, whole grains, and fresh fruits and vegetables. ? Limit foods that are high in fat and processed sugars, such as fried or sweet foods. Activity  Exercise only as directed by your health care provider. Most people can continue their usual exercise routine during pregnancy. Try to exercise for 30 minutes at least 5 days a week. Stop exercising if you develop contractions in your uterus.  Stop exercising if you develop pain or cramping in the lower abdomen or lower back.  Avoid exercising if it is very hot or humid or if you are at  a high altitude.  Avoid heavy lifting.  If you choose to, you may have sex unless your health care provider tells you not to. Relieving pain and discomfort  Wear a supportive bra to prevent discomfort from breast tenderness.  Take warm sitz baths to soothe any pain or discomfort caused by hemorrhoids. Use hemorrhoid cream if your health care provider approves.  Rest with your legs raised (elevated) if you have leg cramps or low back pain.  If you develop  varicose veins: ? Wear support hose as told by your health care provider. ? Elevate your feet for 15 minutes, 3-4 times a day. ? Limit salt in your diet. Safety  Wear your seat belt at all times when driving or riding in a car.  Talk with your health care provider if someone is verbally or physically abusive to you. Lifestyle  Do not use hot tubs, steam rooms, or saunas.  Do not douche. Do not use tampons or scented sanitary pads.  Avoid cat litter boxes and soil used by cats. These carry germs that can cause birth defects in the baby and possibly loss of the fetus by miscarriage or stillbirth.  Do not use herbal remedies, alcohol, illegal drugs, or medicines that are not approved by your health care provider. Chemicals in these products can harm your baby.  Do not use any products that contain nicotine or tobacco, such as cigarettes, e-cigarettes, and chewing tobacco. If you need help quitting, ask your health care provider. General instructions  During a routine prenatal visit, your health care provider will do a physical exam and other tests. He or she will also discuss your overall health. Keep all follow-up visits. This is important.  Ask your health care provider for a referral to a local prenatal education class.  Ask for help if you have counseling or nutritional needs during pregnancy. Your health care provider can offer advice or refer you to specialists for help with various needs. Where to find more information  American Pregnancy Association: americanpregnancy.[place] and Gynecologists: PoolDevices.com.pt  Office on [employer]: KeywordPortfolios.com.br Contact a health care provider if you have:  A headache that does not go away when you take medicine.  Vision changes or you see spots in front of your eyes.  Mild pelvic cramps, pelvic pressure, or nagging pain in the abdominal area.  Persistent nausea,  vomiting, or diarrhea.  A bad-smelling vaginal discharge or foul-smelling urine.  Pain when you urinate.  Sudden or extreme swelling of your face, hands, ankles, feet, or legs.  A fever. Get help right away if you:  Have fluid leaking from your vagina.  Have spotting or bleeding from your vagina.  Have severe abdominal cramping or pain.  Have difficulty breathing.  Have chest pain.  Have fainting spells.  Have not felt your baby move for the time period told by your health care provider.  Have new or increased pain, swelling, or redness in an arm or leg. Summary  The second trimester of pregnancy is from week 13 through week 27 (months 4 through 6).  Do not use herbal remedies, alcohol, illegal drugs, or medicines that are not approved by your health care provider. Chemicals in these products can harm your baby.  Exercise only as directed by your health care provider. Most people can continue their usual exercise routine during pregnancy.  Keep all follow-up visits. This is important. This information is not intended to replace advice given to you by your  health care provider. Make sure you discuss any questions you have with your health care provider. Document Revised: 11/02/2019 Document Reviewed: 09/08/2019 Elsevier Patient Education  2021 [place].   Sciatica Rehab Ask your health care provider which exercises are safe for you. Do exercises exactly as told by your health care provider and adjust them as directed. It is normal to feel mild stretching, pulling, tightness, or discomfort as you do these exercises. Stop right away if you feel sudden pain or your pain gets worse. Do not begin these exercises until told by your health care provider. Stretching and range-of-motion exercises These exercises warm up your muscles and joints and improve the movement and flexibility of your hips and back. These exercises also help to relieve pain, numbness, and  tingling. Sciatic nerve glide 1. Sit in a chair with your head facing down toward your chest. Place your hands behind your back. Let your shoulders slump forward. 2. Slowly straighten one of your legs while you tilt your head back as if you are looking toward the ceiling. Only straighten your leg as far as you can without making your symptoms worse. 3. Hold this position for __________ seconds. 4. Slowly return to the starting position. 5. Repeat with your other leg. Repeat __________ times. Complete this exercise __________ times a day. Knee to chest with hip adduction and internal rotation 1. Lie on your back on a firm surface with both legs straight. 2. Bend one of your knees and move it up toward your chest until you feel a gentle stretch in your lower back and buttock. Then, move your knee toward the shoulder that is on the opposite side from your leg. This is hip adduction and internal rotation. ? Hold your leg in this position by holding on to the front of your knee. 3. Hold this position for __________ seconds. 4. Slowly return to the starting position. 5. Repeat with your other leg. Repeat __________ times. Complete this exercise __________ times a day.   Prone extension on elbows 1. Lie on your abdomen on a firm surface. A bed may be too soft for this exercise. 2. Prop yourself up on your elbows. 3. Use your arms to help lift your chest up until you feel a gentle stretch in your abdomen and your lower back. ? This will place some of your body weight on your elbows. If this is uncomfortable, try stacking pillows under your chest. ? Your hips should stay down, against the surface that you are lying on. Keep your hip and back muscles relaxed. 4. Hold this position for __________ seconds. 5. Slowly relax your upper body and return to the starting position. Repeat __________ times. Complete this exercise __________ times a day.   Strengthening exercises These exercises build strength  and endurance in your back. Endurance is the ability to use your muscles for a long time, even after they get tired. Pelvic tilt This exercise strengthens the muscles that lie deep in the abdomen. 1. Lie on your back on a firm surface. Bend your knees and keep your feet flat on the floor. 2. Tense your abdominal muscles. Tip your pelvis up toward the ceiling and flatten your lower back into the floor. ? To help with this exercise, you may place a small towel under your lower back and try to push your back into the towel. 3. Hold this position for __________ seconds. 4. Let your muscles relax completely before you repeat this exercise. Repeat __________ times. Complete this  exercise __________ times a day. Alternating arm and leg raises 1. Get on your hands and knees on a firm surface. If you are on a hard floor, you may want to use padding, such as an exercise mat, to cushion your knees. 2. Line up your arms and legs. Your hands should be directly below your shoulders, and your knees should be directly below your hips. 3. Lift your left leg behind you. At the same time, raise your right arm and straighten it in front of you. ? Do not lift your leg higher than your hip. ? Do not lift your arm higher than your shoulder. ? Keep your abdominal and back muscles tight. ? Keep your hips facing the ground. ? Do not arch your back. ? Keep your balance carefully, and do not hold your breath. 4. Hold this position for __________ seconds. 5. Slowly return to the starting position. 6. Repeat with your right leg and your left arm. Repeat __________ times. Complete this exercise __________ times a day.   Posture and body mechanics Good posture and healthy body mechanics can help to relieve stress in your body's tissues and joints. Body mechanics refers to the movements and positions of your body while you do your daily activities. Posture is part of body mechanics. Good posture means:  Your spine is in  its natural S-curve position (neutral).  Your shoulders are pulled back slightly.  Your head is not tipped forward. Follow these guidelines to improve your posture and body mechanics in your everyday activities. Standing  When standing, keep your spine neutral and your feet about hip width apart. Keep a slight bend in your knees. Your ears, shoulders, and hips should line up.  When you do a task in which you stand in one place for a long time, place one foot up on a stable object that is 2-4 inches (5-10 cm) high, such as a footstool. This helps keep your spine neutral.   Sitting  When sitting, keep your spine neutral and keep your feet flat on the floor. Use a footrest, if necessary, and keep your thighs parallel to the floor. Avoid rounding your shoulders, and avoid tilting your head forward.  When working at a desk or a computer, keep your desk at a height where your hands are slightly lower than your elbows. Slide your chair under your desk so you are close enough to maintain good posture.  When working at a computer, place your monitor at a height where you are looking straight ahead and you do not have to tilt your head forward or downward to look at the screen.   Resting  When lying down and resting, avoid positions that are most painful for you.  If you have pain with activities such as sitting, bending, stooping, or squatting, lie in a position in which your body does not bend very much. For example, avoid curling up on your side with your arms and knees near your chest (fetal position).  If you have pain with activities such as standing for a long time or reaching with your arms, lie with your spine in a neutral position and bend your knees slightly. Try the following positions: ? Lying on your side with a pillow between your knees. ? Lying on your back with a pillow under your knees. Lifting  When lifting objects, keep your feet at least shoulder width apart and tighten your  abdominal muscles.  Bend your knees and hips and keep your spine neutral.  It is important to lift using the strength of your legs, not your back. Do not lock your knees straight out.  Always ask for help to lift heavy or awkward objects.   This information is not intended to replace advice given to you by your health care provider. Make sure you discuss any questions you have with your health care provider. Document Revised: 09/17/2018 Document Reviewed: 06/17/2018 Elsevier Patient Education  2021 [place].  Sciatica  Sciatica is pain, weakness, tingling, or loss of feeling (numbness) along the sciatic nerve. The sciatic nerve starts in the lower back and goes down the back of each leg. Sciatica usually goes away on its own or with treatment. Sometimes, sciatica may come back (recur). What are the causes? This condition happens when the sciatic nerve is pinched or has pressure put on it. This may be the result of:  A disk in between the bones of the spine bulging out too far (herniated disk).  Changes in the spinal disks that occur with aging.  A condition that affects a muscle in the butt.  Extra bone growth near the sciatic nerve.  A break (fracture) of the area between your hip bones (pelvis).  Pregnancy.  Tumor. This is rare. What increases the risk? You are more likely to develop this condition if you:  Play sports that put pressure or stress on the spine.  Have poor strength and ease of movement (flexibility).  Have had a back injury in the past.  Have had back surgery.  Sit for long periods of time.  Do activities that involve bending or lifting over and over again.  Are very overweight (obese). What are the signs or symptoms? Symptoms can vary from mild to very bad. They may include:  Any of these problems in the lower back, leg, hip, or butt: ? Mild tingling, loss of feeling, or dull aches. ? Burning sensations. ? Sharp pains.  Loss of feeling in the  back of the calf or the sole of the foot.  Leg weakness.  Very bad back pain that makes it hard to move. These symptoms may get worse when you cough, sneeze, or laugh. They may also get worse when you sit or stand for long periods of time. How is this treated? This condition often gets better without any treatment. However, treatment may include:  Changing or cutting back on physical activity when you have pain.  Doing exercises and stretching.  Putting ice or heat on the affected area.  Medicines that help: ? To relieve pain and swelling. ? To relax your muscles.  Shots (injections) of medicines that help to relieve pain, irritation, and swelling.  Surgery. Follow these instructions at home: Medicines  Take over-the-counter and prescription medicines only as told by your doctor.  Ask your doctor if the medicine prescribed to you: ? Requires you to avoid driving or using heavy machinery. ? Can cause trouble pooping (constipation). You may need to take these steps to prevent or treat trouble pooping:  Drink enough fluids to keep your pee (urine) pale yellow.  Take over-the-counter or prescription medicines.  Eat foods that are high in fiber. These include beans, whole grains, and fresh fruits and vegetables.  Limit foods that are high in fat and sugar. These include fried or sweet foods. Managing pain  If told, put ice on the affected area. ? Put ice in a plastic bag. ? Place a towel between your skin and the bag. ? Leave the ice on  for 20 minutes, 2-3 times a day.  If told, put heat on the affected area. Use the heat source that your doctor tells you to use, such as a moist heat pack or a heating pad. ? Place a towel between your skin and the heat source. ? Leave the heat on for 20-30 minutes. ? Remove the heat if your skin turns bright red. This is very important if you are unable to feel pain, heat, or cold. You may have a greater risk of getting burned.       Activity  Return to your normal activities as told by your doctor. Ask your doctor what activities are safe for you.  Avoid activities that make your symptoms worse.  Take short rests during the day. ? When you rest for a long time, do some physical activity or stretching between periods of rest. ? Avoid sitting for a long time without moving. Get up and move around at least one time each hour.  Exercise and stretch regularly, as told by your doctor.  Do not lift anything that is heavier than 10 lb (4.5 kg) while you have symptoms of sciatica. ? Avoid lifting heavy things even when you do not have symptoms. ? Avoid lifting heavy things over and over.  When you lift objects, always lift in a way that is safe for your body. To do this, you should: ? Bend your knees. ? Keep the object close to your body. ? Avoid twisting.   General instructions  Stay at a healthy weight.  Wear comfortable shoes that support your feet. Avoid wearing high heels.  Avoid sleeping on a mattress that is too soft or too hard. You might have less pain if you sleep on a mattress that is firm enough to support your back.  Keep all follow-up visits as told by your doctor. This is important. Contact a doctor if:  You have pain that: ? Wakes you up when you are sleeping. ? Gets worse when you lie down. ? Is worse than the pain you have had in the past. ? Lasts longer than 4 weeks.  You lose weight without trying. Get help right away if:  You cannot control when you pee (urinate) or poop (have a bowel movement).  You have weakness in any of these areas and it gets worse: ? Lower back. ? The area between your hip bones. ? Butt. ? Legs.  You have redness or swelling of your back.  You have a burning feeling when you pee. Summary  Sciatica is pain, weakness, tingling, or loss of feeling (numbness) along the sciatic nerve.  This condition happens when the sciatic nerve is pinched or has pressure  put on it.  Sciatica can cause pain, tingling, or loss of feeling (numbness) in the lower back, legs, hips, and butt.  Treatment often includes rest, exercise, medicines, and putting ice or heat on the affected area. This information is not intended to replace advice given to you by your health care provider. Make sure you discuss any questions you have with your health care provider. Document Revised: 06/14/2018 Document Reviewed: 06/14/2018 Elsevier Patient Education  [place].

## 2020-07-17 NOTE — Progress Notes (Signed)
Routine Prenatal Care Visit  Subjective  Michele Alexander is a 25 y.o. [id number] at [redacted]w[redacted]d being seen today for ongoing prenatal care.  She is currently monitored for the following issues for this low-risk pregnancy and has History of Hodgkin's lymphoma; Sickle cell trait ([place]); Supervision of normal pregnancy; and History of postpartum depression, currently pregnant on their problem list.  ----------------------------------------------------------------------------------- Patient reports backache.   Contractions: Not present. Vag. Bleeding: None.  Movement: Present. Denies leaking of fluid.  ----------------------------------------------------------------------------------- The following portions of the patient's history were reviewed and updated as appropriate: allergies, current medications, past family history, past medical history, past social history, past surgical history and problem list. Problem list updated.   Objective  Blood pressure 112/68, height 4\' 11"  (1.499 m), weight 92 lb 9.6 oz (42 kg), last menstrual period 03/09/2020, unknown if currently breastfeeding. Pregravid weight 88 lb (39.9 kg) Total Weight Gain 4 lb 9.6 oz (2.087 kg) Urinalysis:      Fetal Status: Fetal Heart Rate (bpm): 140   Movement: Present     General:  Alert, oriented and cooperative. Patient is in no acute distress.  Skin: Skin is warm and dry. No rash noted.   Cardiovascular: Normal heart rate noted  Respiratory: Normal respiratory effort, no problems with respiration noted  Abdomen: Soft, gravid, appropriate for gestational age. Pain/Pressure: Absent     Pelvic:  Cervical exam deferred        Extremities: Normal range of motion.  Edema: None  Mental Status: Normal mood and affect. Normal behavior. Normal judgment and thought content.     Assessment   25 y.o. [id number] at [redacted]w[redacted]d by  12/14/2020, by Last Menstrual Period presenting for routine prenatal visit  Plan   pregnancy #8 Problems (from  04/25/20 to present)    Problem Noted Resolved   Supervision of normal pregnancy 08/31/2018 by Octaviano Glow, RN No   Overview Addendum 07/17/2020  3:21 PM by Homero Fellers, MD      [facility]  LAB RESULTS  Language English Pap 08/31/18: neg  Initiated care at [facility] Initial:  -/-          36wks:  Dating by LMP c/w 9wk U/S    Support person [place]: normal female    [place]/HgbE   Flu vaccine 05/18/2020 CF neg  TDaP vaccine 12/28/18  SMA neg  Rhogam Not needed Fragile X neg  Covid + during pregnancy, considering vaccination    Anatomy US complete Blood Type O/Positive/-- (03/24 1603)  Feeding Plan breast Antibody Negative (03/24 1603)  Contraception POPs HBsAg Negative (03/24 1603)  Circumcision n/a RPR Non Reactive (03/24 1603)  Pediatrician [facility] Rubella  1.01 (03/24 1603)  Prenatal Classes declines HIV Non Reactive (03/24 1603)    GTT/A1C Early:      26-28wks:  77/90/97  BTL Consent n/a GBS        [ ]  PCN allergy  VBAC Consent n/a    Waterbirth  PP Needs            Previous Version       Discussed covid vaccination Referral to PT for back pain during pregnancy and sciatica.   Gestational age appropriate obstetric precautions including but not limited to vaginal bleeding, contractions, leaking of fluid and fetal movement were reviewed in detail with the patient.    Return in about 4 weeks (around 08/14/2020) for ROB in person.  Homero Fellers MD [facility] 07/17/2020, 3:21 PM

## 2020-07-24 ENCOUNTER — Ambulatory Visit: Payer: Medicaid Other | Attending: Obstetrics and Gynecology | Admitting: Physical Therapy

## 2020-07-26 ENCOUNTER — Other Ambulatory Visit: Payer: Self-pay | Admitting: Obstetrics & Gynecology

## 2020-07-26 ENCOUNTER — Telehealth: Payer: Self-pay

## 2020-07-26 MED ORDER — ENSURE PO LIQD: 237.0000 mL | Freq: Two times a day (BID) | ORAL | 12 refills | Status: DC

## 2020-07-26 NOTE — Telephone Encounter (Signed)
Pt calling; [facility] at [place] is concerned she hasn't gained wt; they would like a WIC rx for Ensure for them to fill; also, pt's d/c is starting to have a green tint to it.  [phone]

## 2020-07-27 NOTE — Telephone Encounter (Signed)
Msg has been taken care of in a telephone encounter.

## 2020-07-27 NOTE — Telephone Encounter (Signed)
Im not sure what to do here, is it for the Ensure?

## 2020-07-27 NOTE — Telephone Encounter (Signed)
Rx faxed. Pt aware and this is her new number. [phone]

## 2020-07-30 ENCOUNTER — Encounter: Payer: Medicaid Other | Admitting: Obstetrics & Gynecology

## 2020-07-30 ENCOUNTER — Ambulatory Visit: Payer: Medicaid Other

## 2020-07-30 DIAGNOSIS — Z3482 Encounter for supervision of other normal pregnancy, second trimester: Secondary | ICD-10-CM

## 2020-07-30 DIAGNOSIS — Z369 Encounter for antenatal screening, unspecified: Secondary | ICD-10-CM

## 2020-07-31 ENCOUNTER — Ambulatory Visit: Payer: Medicaid Other | Admitting: Physical Therapy

## 2020-08-07 ENCOUNTER — Encounter: Payer: Medicaid Other | Admitting: Physical Therapy

## 2020-08-09 ENCOUNTER — Encounter: Payer: Medicaid Other | Admitting: Physical Therapy

## 2020-08-14 ENCOUNTER — Other Ambulatory Visit: Payer: Self-pay

## 2020-08-14 ENCOUNTER — Ambulatory Visit (INDEPENDENT_AMBULATORY_CARE_PROVIDER_SITE_OTHER): Payer: Medicaid Other | Admitting: Obstetrics & Gynecology

## 2020-08-14 ENCOUNTER — Encounter: Payer: Self-pay | Admitting: Obstetrics & Gynecology

## 2020-08-14 VITALS — BP 100/60 | Wt 97.0 lb

## 2020-08-14 DIAGNOSIS — Z3482 Encounter for supervision of other normal pregnancy, second trimester: Secondary | ICD-10-CM

## 2020-08-14 DIAGNOSIS — Z131 Encounter for screening for diabetes mellitus: Secondary | ICD-10-CM

## 2020-08-14 DIAGNOSIS — Z3A22 22 weeks gestation of pregnancy: Secondary | ICD-10-CM

## 2020-08-14 LAB — POCT URINALYSIS DIPSTICK OB
Bilirubin, UA: NEGATIVE
Blood, UA: NEGATIVE
Glucose, UA: NEGATIVE
Ketones, UA: NEGATIVE
Leukocytes, UA: NEGATIVE
Nitrite, UA: NEGATIVE
POC,PROTEIN,UA: NEGATIVE
Spec Grav, UA: 1.01 (ref 1.010–1.025)
Urobilinogen, UA: 0.2 E.U./dL
pH, UA: 5 (ref 5.0–8.0)

## 2020-08-14 NOTE — Patient Instructions (Signed)
Thank you for choosing [facility]. As part of our ongoing efforts to improve patient experience, we would appreciate your feedback. Please fill out the short survey that you will receive by mail or MyChart. Your opinion is important to us! -Dr Charmagne Buhl  Second Trimester of Pregnancy  The second trimester of pregnancy is from week 13 through week 27. This is months 4 through 6 of pregnancy. The second trimester is often a time when you feel your best. Your body has adjusted to being pregnant, and you begin to feel better physically. During the second trimester:  Morning sickness has lessened or stopped completely.  You may have more energy.  You may have an increase in appetite. The second trimester is also a time when the unborn baby (fetus) is growing rapidly. At the end of the sixth month, the fetus may be up to 12 inches long and weigh about 1 pounds. You will likely begin to feel the baby move (quickening) between 16 and 20 weeks of pregnancy. Body changes during your second trimester Your body continues to go through many changes during your second trimester. The changes vary and generally return to normal after the baby is born. Physical changes  Your weight will continue to increase. You will notice your lower abdomen bulging out.  You may begin to get stretch marks on your hips, abdomen, and breasts.  Your breasts will continue to grow and to become tender.  Dark spots or blotches (chloasma or mask of pregnancy) may develop on your face.  A dark line from your belly button to the pubic area (linea nigra) may appear.  You may have changes in your hair. These can include thickening of your hair, rapid growth, and changes in texture. Some people also have hair loss during or after pregnancy, or hair that feels dry or thin. Health changes  You may develop headaches.  You may have heartburn.  You may develop constipation.  You may develop hemorrhoids or swollen, bulging  veins (varicose veins).  Your gums may bleed and may be sensitive to brushing and flossing.  You may urinate more often because the fetus is pressing on your bladder.  You may have back pain. This is caused by: ? Weight gain. ? Pregnancy hormones that are relaxing the joints in your pelvis. ? A shift in weight and the muscles that support your balance. Follow these instructions at home: Medicines  Follow your health care provider's instructions regarding medicine use. Specific medicines may be either safe or unsafe to take during pregnancy. Do not take any medicines unless approved by your health care provider.  Take a prenatal vitamin that contains at least 600 micrograms (mcg) of folic acid. Eating and drinking  Eat a healthy diet that includes fresh fruits and vegetables, whole grains, good sources of protein such as meat, eggs, or tofu, and low-fat dairy products.  Avoid raw meat and unpasteurized juice, milk, and cheese. These carry germs that can harm you and your baby.  You may need to take these actions to prevent or treat constipation: ? Drink enough fluid to keep your urine pale yellow. ? Eat foods that are high in fiber, such as beans, whole grains, and fresh fruits and vegetables. ? Limit foods that are high in fat and processed sugars, such as fried or sweet foods. Activity  Exercise only as directed by your health care provider. Most people can continue their usual exercise routine during pregnancy. Try to exercise for 30 minutes at least   5 days a week. Stop exercising if you develop contractions in your uterus.  Stop exercising if you develop pain or cramping in the lower abdomen or lower back.  Avoid exercising if it is very hot or humid or if you are at a high altitude.  Avoid heavy lifting.  If you choose to, you may have sex unless your health care provider tells you not to. Relieving pain and discomfort  Wear a supportive bra to prevent discomfort from  breast tenderness.  Take warm sitz baths to soothe any pain or discomfort caused by hemorrhoids. Use hemorrhoid cream if your health care provider approves.  Rest with your legs raised (elevated) if you have leg cramps or low back pain.  If you develop varicose veins: ? Wear support hose as told by your health care provider. ? Elevate your feet for 15 minutes, 3-4 times a day. ? Limit salt in your diet. Safety  Wear your seat belt at all times when driving or riding in a car.  Talk with your health care provider if someone is verbally or physically abusive to you. Lifestyle  Do not use hot tubs, steam rooms, or saunas.  Do not douche. Do not use tampons or scented sanitary pads.  Avoid cat litter boxes and soil used by cats. These carry germs that can cause birth defects in the baby and possibly loss of the fetus by miscarriage or stillbirth.  Do not use herbal remedies, alcohol, illegal drugs, or medicines that are not approved by your health care provider. Chemicals in these products can harm your baby.  Do not use any products that contain nicotine or tobacco, such as cigarettes, e-cigarettes, and chewing tobacco. If you need help quitting, ask your health care provider. General instructions  During a routine prenatal visit, your health care provider will do a physical exam and other tests. He or she will also discuss your overall health. Keep all follow-up visits. This is important.  Ask your health care provider for a referral to a local prenatal education class.  Ask for help if you have counseling or nutritional needs during pregnancy. Your health care provider can offer advice or refer you to specialists for help with various needs. Where to find more information  American Pregnancy Association: americanpregnancy.org  American College of Obstetricians and Gynecologists: acog.org/en/Womens%20Health/Pregnancy  Office on Women's Health: womenshealth.gov/pregnancy Contact  a health care provider if you have:  A headache that does not go away when you take medicine.  Vision changes or you see spots in front of your eyes.  Mild pelvic cramps, pelvic pressure, or nagging pain in the abdominal area.  Persistent nausea, vomiting, or diarrhea.  A bad-smelling vaginal discharge or foul-smelling urine.  Pain when you urinate.  Sudden or extreme swelling of your face, hands, ankles, feet, or legs.  A fever. Get help right away if you:  Have fluid leaking from your vagina.  Have spotting or bleeding from your vagina.  Have severe abdominal cramping or pain.  Have difficulty breathing.  Have chest pain.  Have fainting spells.  Have not felt your baby move for the time period told by your health care provider.  Have new or increased pain, swelling, or redness in an arm or leg. Summary  The second trimester of pregnancy is from week 13 through week 27 (months 4 through 6).  Do not use herbal remedies, alcohol, illegal drugs, or medicines that are not approved by your health care provider. Chemicals in these products can   harm your baby.  Exercise only as directed by your health care provider. Most people can continue their usual exercise routine during pregnancy.  Keep all follow-up visits. This is important. This information is not intended to replace advice given to you by your health care provider. Make sure you discuss any questions you have with your health care provider. Document Revised: 11/02/2019 Document Reviewed: 09/08/2019 Elsevier Patient Education  2021 Elsevier Inc.  

## 2020-08-14 NOTE — Progress Notes (Signed)
  Subjective  Fetal Movement? yes Contractions? no Leaking Fluid? no Vaginal Bleeding? no Pt has lower suprapubic pain these past few days    No radiation.  Objective  BP 100/60   Wt 97 lb (44 kg)   LMP 03/09/2020 (Exact Date)   BMI 19.59 kg/m  General: NAD Pumonary: no increased work of breathing Abdomen: gravid, non-tender Extremities: no edema Psychiatric: mood appropriate, affect full  Assessment  25 y.o. [id number] at [redacted]w[redacted]d by  12/14/2020, by Last Menstrual Period presenting for routine prenatal visit  Plan   Problem List Items Addressed This Visit      Other   Supervision of normal pregnancy    Other Visit Diagnoses    [redacted] weeks gestation of pregnancy    -  Primary   Relevant Orders   POC Urinalysis Dipstick OB (Completed)   Screening for diabetes mellitus       Relevant Orders   28 Week RH+Panel    Monitor pain;; heat and Tylenol    Likely pubic symphysis related pains PNV Glucola nv      [facility]  LAB RESULTS  Language English Pap 08/31/18: neg  Initiated care at [facility] GC/CT Initial:  -/-          36wks:  Dating by LMP c/w 9wk U/S    Support person [facility] [employer] [facility]: normal female    [place]/HgbE   Flu vaccine 05/18/2020 CF neg  TDaP vaccine 12/28/18  SMA neg  Rhogam Not needed Fragile X neg  Covid + during pregnancy, considering vaccination    Anatomy US complete Blood Type O/Positive/-- (03/24 1603)  Feeding Plan breast Antibody Negative (03/24 1603)  Contraception POPs HBsAg Negative (03/24 1603)  Circumcision n/a RPR Non Reactive (03/24 1603)  Pediatrician [facility] Rubella  1.01 (03/24 1603)  Prenatal Classes declines HIV Non Reactive (03/24 1603)    GTT/A1C Early:      26-28wks:  77/90/97  BTL Consent n/a GBS        [ ]  PCN allergy  VBAC Consent n/a    Waterbirth no PP Needs           Barnett Applebaum, MD, [facility], [facility] 08/14/2020  4:11 PM

## 2020-08-24 ENCOUNTER — Observation Stay
Admission: EM | Admit: 2020-08-24 | Discharge: 2020-08-25 | Disposition: A | Discharge status: Home / Self Care | Payer: Medicaid Other | Attending: Obstetrics and Gynecology | Admitting: Obstetrics and Gynecology

## 2020-08-24 ENCOUNTER — Encounter: Payer: Self-pay | Admitting: Obstetrics and Gynecology

## 2020-08-24 ENCOUNTER — Other Ambulatory Visit: Payer: Self-pay

## 2020-08-24 DIAGNOSIS — Z3A24 24 weeks gestation of pregnancy: Secondary | ICD-10-CM | POA: Diagnosis not present

## 2020-08-24 DIAGNOSIS — O0001 Abdominal pregnancy with intrauterine pregnancy: Secondary | ICD-10-CM | POA: Diagnosis not present

## 2020-08-24 DIAGNOSIS — Z3482 Encounter for supervision of other normal pregnancy, second trimester: Secondary | ICD-10-CM

## 2020-08-24 DIAGNOSIS — O26892 Other specified pregnancy related conditions, second trimester: Secondary | ICD-10-CM | POA: Diagnosis present

## 2020-08-24 DIAGNOSIS — Z3490 Encounter for supervision of normal pregnancy, unspecified, unspecified trimester: Secondary | ICD-10-CM

## 2020-08-24 DIAGNOSIS — N898 Other specified noninflammatory disorders of vagina: Secondary | ICD-10-CM | POA: Diagnosis present

## 2020-08-24 DIAGNOSIS — O26852 Spotting complicating pregnancy, second trimester: Secondary | ICD-10-CM | POA: Diagnosis not present

## 2020-08-24 LAB — WET PREP, GENITAL
Clue Cells Wet Prep HPF POC: NONE SEEN
Sperm: NONE SEEN
Trich, Wet Prep: NONE SEEN
Yeast Wet Prep HPF POC: NONE SEEN

## 2020-08-24 LAB — URINALYSIS, COMPLETE (UACMP) WITH MICROSCOPIC
Bilirubin Urine: NEGATIVE
Glucose, UA: NEGATIVE mg/dL
Hgb urine dipstick: NEGATIVE
Ketones, ur: NEGATIVE mg/dL
Nitrite: NEGATIVE
Protein, ur: NEGATIVE mg/dL
Specific Gravity, Urine: 1.021 (ref 1.005–1.030)
pH: 7 (ref 5.0–8.0)

## 2020-08-24 NOTE — OB Triage Note (Addendum)
Pt is a G15P2 and [redacted]w[redacted]d presenting to L&D with complaint of vaginal discharge that started one week ago and has increased in amount the last few days. Pt describes discharge as "white with pink tinge and thick." Pt denies any vaginal itching or dryness and denies odor. Pt also states she has had a headache the last two days, felt more fatigued and has had 4 episodes of diarrhea in the last 48 hours. Pt states she received her first dose of the Covid vaccine 07/23/20 but has not returned for the second. Pt confirms positive fetal movement, denies VB and LOF. Monitors applied and assessing. VSS. All questions answered at this time.

## 2020-08-24 NOTE — Final Progress Note (Signed)
Physician Final Progress Note  Patient ID: Michele Alexander MRN: [id number] DOB/AGE: 30-Sep-1995 25 y.o.  Admit date: 08/24/2020 Admitting provider: Will Bonnet, MD Discharge date: 08/25/2020   Admission Diagnoses:  1) intrauterine pregnancy at [redacted]w[redacted]d  2) vaginal discharge during pregnancy, second  trimester  Discharge Diagnoses:  Active Problems:   Supervision of normal pregnancy   Vaginal discharge during pregnancy in second trimester   [redacted] weeks gestation of pregnancy  History of Present Illness: The patient is a 25 y.o. female [phone] at [redacted]w[redacted]d who presents for vaginal discharge that has been present for about a week.  She had a pink tinge to her discharge today while she was at [facility]. She has noticed no pink tinge since that time. She denies vaginal irritative symptoms of itching, burning, and irritation. She denies urinary symptoms. She has had some loose stools, though she goes to the bathroom the normal number of times. She notes +FM, no LOF, and no frank vaginal bleeding. She notes mild cramping.  Her pregnancy is complicated by a history of preterm labor in a previous pregnancy where she went on to have a term vaginal delivery.  She also has a history of Hodgkin Lymphoma.    Past Medical History:  Diagnosis Date  . Anemia   . Anxiety   . Blood transfusion without reported diagnosis   . Cancer ([place])    Hodgkin's Lymphoma  . Depression   . Hodgkin's disease(201)   . Sickle cell trait ([place])    traits    Past Surgical History:  Procedure Laterality Date  . DILATION AND EVACUATION N/A 12/21/2015   Procedure: DILATATION AND EVACUATION;  Surgeon: Will Bonnet, MD;  Location: [facility] ORS;  Service: Gynecology;  Laterality: N/A;  . NECK LESION BIOPSY    . OTHER SURGICAL HISTORY N/A 2015   Endoscopy  . PORT-A-CATH REMOVAL    . PORTACATH PLACEMENT Left 2010    No current facility-administered medications on file prior to encounter.   Current Outpatient Medications on  File Prior to Encounter  Medication Sig Dispense Refill  . Ensure (ENSURE) Take 237 mLs by mouth 2 (two) times daily between meals. 8000 mL 12  . escitalopram (LEXAPRO) 10 MG tablet Take 1 tablet (10 mg total) by mouth daily. 30 tablet 3  . folic acid (FOLVITE) 1 MG tablet Take 1 tablet (1 mg total) by mouth daily. 30 tablet 10    Allergies  Allergen Reactions  . Other Hives    Oral CT contrast Oral CT contrast  . Diatrizoate Hives    PO only.  Tolerates IV contrast.  . Iodinated Diagnostic Agents Hives  . Nexium [Esomeprazole Magnesium] Swelling  . Cheese Rash and Swelling    Lip swelling    Social History   Socioeconomic History  . Marital status: Married    Spouse name: Not on file  . Number of children: 1  . Years of education: Not on file  . Highest education level: Not on file  Occupational History  . Not on file  Tobacco Use  . Smoking status: Never Smoker  . Smokeless tobacco: Never Used  Vaping Use  . Vaping Use: Never used  Substance and Sexual Activity  . Alcohol use: No  . Drug use: No  . Sexual activity: Yes    Partners: Male    Comment: undecided  Other Topics Concern  . Not on file  Social History Narrative   Right handed      Lives with husband  in two story home   Social Determinants of Health   Financial Resource Strain: Not on file  Food Insecurity: Not on file  Transportation Needs: Not on file  Physical Activity: Not on file  Stress: Not on file  Social Connections: Not on file  Intimate Partner Violence: Not on file    Family History  Problem Relation Age of Onset  . Hypertension Mother   . Diabetes Maternal Grandfather   . Hypertension Maternal Grandfather   . Cancer Maternal Grandfather   . Cancer Maternal Grandmother   . Asthma Sister   . Asthma Daughter      Review of Systems  Constitutional: Negative.   HENT: Negative.   Eyes: Negative.   Respiratory: Negative.   Cardiovascular: Negative.   Gastrointestinal:  Negative.   Genitourinary: Negative.        See HPI  Musculoskeletal: Negative.   Skin: Negative.   Neurological: Negative.   Psychiatric/Behavioral: Negative.      Physical Exam: BP 129/78 (BP Location: Right Arm)   Pulse 93   Temp 98.5 F (36.9 C) (Oral)   Resp 16   Ht 4\' 11"  (1.499 m)   Wt 44 kg   LMP 03/09/2020 (Exact Date)   BMI 19.59 kg/m   Physical Exam Constitutional:      General: She is not in acute distress.    Appearance: Normal appearance. She is well-developed.  Genitourinary:     Vulva, bladder and urethral meatus normal.     Right Labia: No rash, tenderness, lesions, skin changes or Bartholin's cyst.    Left Labia: No tenderness, skin changes, Bartholin's cyst or rash.    No inguinal adenopathy present in the right or left side.    Pelvic Tanner Score: 5/5.     Right Adnexa: not tender, not full and no mass present.    Left Adnexa: not tender, not full and no mass present.    No cervical motion tenderness, friability, lesion or polyp.     Cervical exam comments: SSE:  No blood or active bleeding noted. No lesions.  SVE: closed, thick, high.     Uterus is enlarged (gravid).     Uterus is not fixed or tender.     Uterus is anteverted.     No urethral tenderness or mass present.     Pelvic exam was performed with patient in the lithotomy position.  HENT:     Head: Normocephalic and atraumatic.  Eyes:     General: No scleral icterus.    Conjunctiva/sclera: Conjunctivae normal.  Cardiovascular:     Rate and Rhythm: Normal rate and regular rhythm.     Heart sounds: No murmur heard. No friction rub. No gallop.   Pulmonary:     Effort: Pulmonary effort is normal. No respiratory distress.     Breath sounds: Normal breath sounds. No wheezing or rales.  Abdominal:     General: Bowel sounds are normal. There is no distension.     Palpations: Abdomen is soft. There is mass (gravid).     Tenderness: There is no abdominal tenderness. There is no guarding or  rebound.     Hernia: There is no hernia in the left inguinal area or right inguinal area.  Musculoskeletal:        General: Normal range of motion.     Cervical back: Normal range of motion and neck supple.  Lymphadenopathy:     Lower Body: No right inguinal adenopathy. No left inguinal adenopathy.  Neurological:  General: No focal deficit present.     Mental Status: She is alert and oriented to person, place, and time.     Cranial Nerves: No cranial nerve deficit.  Skin:    General: Skin is warm and dry.     Findings: No erythema.  Psychiatric:        Mood and Affect: Mood normal.        Behavior: Behavior normal.        Judgment: Judgment normal.     Consults: none  Significant Findings/ Diagnostic Studies:  Lab Results  Component Value Date   APPEARANCEUR HAZY (A) 08/24/2020   GLUCOSEU NEGATIVE 08/24/2020   BILIRUBINUR NEGATIVE 08/24/2020   KETONESUR NEGATIVE 08/24/2020   LABSPEC 1.021 08/24/2020   HGBUR NEGATIVE 08/24/2020   PHURINE 7.0 08/24/2020   NITRITE NEGATIVE 08/24/2020   LEUKOCYTESUR TRACE (A) 08/24/2020   RBCU 0-5 08/24/2020   WBCU 6-10 08/24/2020   BACTERIA RARE (A) 08/24/2020   EPIU 6-10 08/24/2020    Lab Results  Component Value Date   TRICHWETPREP NONE SEEN 08/24/2020   CLUECESSL NONE SEEN 08/24/2020   WBCWETPREP MANY (A) 08/24/2020   YEASTWETPREP NONE SEEN 08/24/2020    Lab Results  Component Value Date   CHLAMYDIA NOT DETECTED 08/24/2020   NGONORRHOEAE NOT DETECTED 08/24/2020     Procedures: None  Hospital Course: The patient was admitted to Labor and Delivery Triage for observation. She had normal vital signs. The fetal heart rate was measured and was normal.  She had an unremarkable exam, not suggestive of infection.  She had a negative wet pre and no evidence of STI.  Her cervix was closed and there was no evidence of bleeding on exam. She was discharged in stable condition with reassurance.   Discharge Condition:  stable  Disposition: Discharge disposition: 01-Home or Self Care       Diet: Regular diet  Discharge Activity: Activity as tolerated   Allergies as of 08/25/2020      Reactions   Other Hives   Oral CT contrast Oral CT contrast   Diatrizoate Hives   PO only.  Tolerates IV contrast.   Iodinated Diagnostic Agents Hives   Nexium [esomeprazole Magnesium] Swelling   Cheese Rash, Swelling   Lip swelling      Medication List    TAKE these medications   Ensure Take 237 mLs by mouth 2 (two) times daily between meals.   escitalopram 10 MG tablet Commonly known as: Lexapro Take 1 tablet (10 mg total) by mouth daily.   folic acid 1 MG tablet Commonly known as: FOLVITE Take 1 tablet (1 mg total) by mouth daily.        Total time spent taking care of this patient: 30 minutes  Signed: Prentice Docker, MD  08/25/2020, 12:48 AM

## 2020-08-25 DIAGNOSIS — O99891 Other specified diseases and conditions complicating pregnancy: Secondary | ICD-10-CM

## 2020-08-25 DIAGNOSIS — Z3A24 24 weeks gestation of pregnancy: Secondary | ICD-10-CM | POA: Diagnosis not present

## 2020-08-25 DIAGNOSIS — N898 Other specified noninflammatory disorders of vagina: Secondary | ICD-10-CM | POA: Diagnosis not present

## 2020-08-25 DIAGNOSIS — O0001 Abdominal pregnancy with intrauterine pregnancy: Secondary | ICD-10-CM | POA: Diagnosis not present

## 2020-08-25 LAB — CHLAMYDIA/NGC RT PCR (ARMC ONLY)
Chlamydia Tr: NOT DETECTED
N gonorrhoeae: NOT DETECTED

## 2020-08-25 NOTE — Discharge Summary (Signed)
See final progress note. 

## 2020-08-25 NOTE — OB Triage Note (Signed)
Discharge instructions reviewed and pt verbalized understanding. Pt discharged with significant other. All questions answered and pt stable at time of discharge.

## 2020-08-28 ENCOUNTER — Encounter: Payer: Medicaid Other | Admitting: Obstetrics and Gynecology

## 2020-08-30 ENCOUNTER — Other Ambulatory Visit: Payer: Self-pay

## 2020-08-30 ENCOUNTER — Ambulatory Visit (INDEPENDENT_AMBULATORY_CARE_PROVIDER_SITE_OTHER): Payer: Medicaid Other | Admitting: Advanced Practice Midwife

## 2020-08-30 ENCOUNTER — Encounter: Payer: Self-pay | Admitting: Advanced Practice Midwife

## 2020-08-30 VITALS — BP 100/60 | Wt 97.0 lb

## 2020-08-30 DIAGNOSIS — R519 Headache, unspecified: Secondary | ICD-10-CM

## 2020-08-30 DIAGNOSIS — F419 Anxiety disorder, unspecified: Secondary | ICD-10-CM | POA: Insufficient documentation

## 2020-08-30 DIAGNOSIS — Z3A24 24 weeks gestation of pregnancy: Secondary | ICD-10-CM

## 2020-08-30 DIAGNOSIS — O26892 Other specified pregnancy related conditions, second trimester: Secondary | ICD-10-CM

## 2020-08-30 DIAGNOSIS — Z3482 Encounter for supervision of other normal pregnancy, second trimester: Secondary | ICD-10-CM

## 2020-08-30 LAB — POCT URINALYSIS DIPSTICK OB
Glucose, UA: NEGATIVE
POC,PROTEIN,UA: NEGATIVE

## 2020-08-30 MED ORDER — BUTALBITAL-APAP-CAFFEINE 50-325-40 MG PO CAPS: 1.0000 | ORAL_CAPSULE | Freq: Four times a day (QID) | ORAL | 3 refills | Status: DC | PRN

## 2020-08-30 NOTE — Progress Notes (Signed)
Routine Prenatal Care Visit  Subjective  Michele Alexander is a 25 y.o. [phone] at [redacted]w[redacted]d being seen today for ongoing prenatal care.  She is currently monitored for the following issues for this low-risk pregnancy and has History of Hodgkin's lymphoma; Sickle cell trait ([place]); Supervision of normal pregnancy; History of postpartum depression, currently pregnant; Vaginal discharge during pregnancy in second trimester; [redacted] weeks gestation of pregnancy; and Anxiety on their problem list.  ----------------------------------------------------------------------------------- Patient reports headache for the past 4 or 5 days with some visual changes. The pain is throbbing and frontal/sinus. She has had loss of vision/spots in her right eye a few days ago. She also reports upper abdominal pain especially on the right. She tried tylenol, dark room, cloth on head, all without much relief. She declines additional work up at this time for neuro or possible cholestasis. She is advised to go to ER for worsening/severe symptoms. She would like to start with Fioricet, increasing hydration and taking OTC magnesium.   Contractions: Not present. Vag. Bleeding: None.  Movement: Present. Leaking Fluid denies.  ----------------------------------------------------------------------------------- The following portions of the patient's history were reviewed and updated as appropriate: allergies, current medications, past family history, past medical history, past social history, past surgical history and problem list. Problem list updated.  Objective  Blood pressure 100/60, weight 97 lb (44 kg), last menstrual period 03/09/2020 Pregravid weight 88 lb (39.9 kg) Total Weight Gain 9 lb (4.082 kg) Urinalysis: Urine Protein Negative  Urine Glucose Negative  Fetal Status: Fetal Heart Rate (bpm): 147 Fundal Height: 24 cm Movement: Present     General:  Alert, oriented and cooperative. Patient is in mild acute distress due to  headache.  Skin: Skin is warm and dry. No rash noted.   Cardiovascular: Normal heart rate noted  Respiratory: Normal respiratory effort, no problems with respiration noted  Abdomen: Soft, gravid, appropriate for gestational age. Pain/Pressure: Present     Pelvic:  Cervical exam deferred        Extremities: Normal range of motion.  Edema: None  Mental Status: Normal mood and affect. Normal behavior. Normal judgment and thought content.   Assessment   25 y.o. [id number] at [redacted]w[redacted]d by  12/14/2020, by Last Menstrual Period presenting for work-in prenatal visit  Plan   pregnancy #8 Problems (from 04/25/20 to present)    Problem Noted Resolved   Supervision of normal pregnancy 08/31/2018 by Octaviano Glow, RN No   Overview Addendum 07/17/2020  3:21 PM by Homero Fellers, MD      [facility]  LAB RESULTS  Language English Pap 08/31/18: neg  Initiated care at [facility] GC/CT Initial:  -/-          36wks:  Dating by LMP c/w 9wk U/S    Support person [place]: normal female    [place]/HgbE   Flu vaccine 05/18/2020 CF neg  TDaP vaccine 12/28/18  SMA neg  Rhogam Not needed Fragile X neg  Covid + during pregnancy, considering vaccination    Anatomy US complete Blood Type O/Positive/-- (03/24 1603)  Feeding Plan breast Antibody Negative (03/24 1603)  Contraception POPs HBsAg Negative (03/24 1603)  Circumcision n/a RPR Non Reactive (03/24 1603)  Pediatrician [facility] Rubella  1.01 (03/24 1603)  Prenatal Classes declines HIV Non Reactive (03/24 1603)    GTT/A1C Early:      26-28wks:  77/90/97  BTL Consent n/a GBS        [ ]  PCN allergy  VBAC Consent n/a    Doren Custard  PP Needs            Previous Version       Preterm labor symptoms and general obstetric precautions including but not limited to vaginal bleeding, contractions, leaking of fluid and fetal movement were reviewed in detail with the patient.   Return for scheduled prenatal appointment.  Rod Can,  CNM 08/30/2020 11:29 AM

## 2020-09-11 ENCOUNTER — Other Ambulatory Visit: Payer: Medicaid Other

## 2020-09-11 ENCOUNTER — Other Ambulatory Visit: Payer: Self-pay

## 2020-09-11 ENCOUNTER — Ambulatory Visit (INDEPENDENT_AMBULATORY_CARE_PROVIDER_SITE_OTHER): Payer: Medicaid Other | Admitting: Obstetrics

## 2020-09-11 VITALS — BP 102/64 | Wt 100.0 lb

## 2020-09-11 DIAGNOSIS — R519 Headache, unspecified: Secondary | ICD-10-CM

## 2020-09-11 DIAGNOSIS — Z131 Encounter for screening for diabetes mellitus: Secondary | ICD-10-CM

## 2020-09-11 DIAGNOSIS — O26892 Other specified pregnancy related conditions, second trimester: Secondary | ICD-10-CM

## 2020-09-11 DIAGNOSIS — Z3A26 26 weeks gestation of pregnancy: Secondary | ICD-10-CM

## 2020-09-11 DIAGNOSIS — Z3480 Encounter for supervision of other normal pregnancy, unspecified trimester: Secondary | ICD-10-CM

## 2020-09-11 NOTE — Progress Notes (Signed)
No vb. No lof. 28 week labs today.

## 2020-09-11 NOTE — Progress Notes (Signed)
Routine Prenatal Care Visit  Subjective  Michele Alexander is a 25 y.o. [phone] at [redacted]w[redacted]d being seen today for ongoing prenatal care.  She is currently monitored for the following issues for this high-risk pregnancy and has History of Hodgkin's lymphoma; Sickle cell trait ([place]); Supervision of normal pregnancy; History of postpartum depression, currently pregnant; Vaginal discharge during pregnancy in second trimester; [redacted] weeks gestation of pregnancy; and Anxiety on their problem list.  ----------------------------------------------------------------------------------- Patient reports headache.  She was started on Fioricet several weeks ago, and continues to use it, but describes daily pain despite the medication. Aslo sharews a vague history of onging headache pain and not followoing up with Neuro in [place] several years ago.  Today she denies any carmaping or vaginla bleeding, and her baby is moving well. She has named her daughter "[place]" Contractions: Not present. Vag. Bleeding: None.  Movement: Present. Leaking Fluid denies.  ----------------------------------------------------------------------------------- The following portions of the patient's history were reviewed and updated as appropriate: allergies, current medications, past family history, past medical history, past social history, past surgical history and problem list. Problem list updated.  Objective  Blood pressure 102/64, weight 100 lb (45.4 kg), last menstrual period 03/09/2020, unknown if currently breastfeeding. Pregravid weight 88 lb (39.9 kg) Total Weight Gain 12 lb (5.443 kg) Urinalysis: Urine Protein    Urine Glucose    Fetal Status:     Movement: Present     General:  Alert, oriented and cooperative. Patient is in no acute distress.  Skin: Skin is warm and dry. No rash noted.   Cardiovascular: Normal heart rate noted  Respiratory: Normal respiratory effort, no problems with respiration noted  Abdomen: Soft, gravid,  appropriate for gestational age. Pain/Pressure: Absent     Pelvic:  Cervical exam deferred        Extremities: Normal range of motion.  Edema: None  Mental Status: Normal mood and affect. Normal behavior. Normal judgment and thought content.   Assessment   25 y.o. [id number] at [redacted]w[redacted]d by  12/14/2020, by Last Menstrual Period presenting for routine prenatal visit  Plan   pregnancy #8 Problems (from 04/25/20 to present)    Problem Noted Resolved   Supervision of normal pregnancy 08/31/2018 by Octaviano Glow, RN No   Overview Addendum 07/17/2020  3:21 PM by Homero Fellers, MD      [facility]  LAB RESULTS  Language English Pap 08/31/18: neg  Initiated care at [facility] GC/CT Initial:  -/-          36wks:  Dating by LMP c/w 9wk U/S    Support person [place]: normal female    [place]/HgbE   Flu vaccine 05/18/2020 CF neg  TDaP vaccine 12/28/18  SMA neg  Rhogam Not needed Fragile X neg  Covid + during pregnancy, considering vaccination    Anatomy US complete Blood Type O/Positive/-- (03/24 1603)  Feeding Plan breast Antibody Negative (03/24 1603)  Contraception POPs HBsAg Negative (03/24 1603)  Circumcision n/a RPR Non Reactive (03/24 1603)  Pediatrician [facility] Rubella  1.01 (03/24 1603)  Prenatal Classes declines HIV Non Reactive (03/24 1603)    GTT/A1C Early:      26-28wks:  77/90/97  BTL Consent n/a GBS        [ ]  PCN allergy  VBAC Consent n/a    Waterbirth  PP Needs            Previous Version       Preterm labor symptoms and general obstetric precautions including but  not limited to vaginal bleeding, contractions, leaking of fluid and fetal movement were reviewed in detail with the patient. Please refer to After Visit Summary for other counseling recommendations.  Glucose testing, labs today. She is not taking magnesium as advised. Strongly advised to start.  Contraception also addressed- careful discussion related to her higher risks, as the FOB wants a  boy.  Return in about 2 weeks (around 09/25/2020) for return OB.  Imagene Riches, CNM  09/11/2020 10:09 AM

## 2020-09-12 ENCOUNTER — Other Ambulatory Visit: Payer: Self-pay | Admitting: Obstetrics

## 2020-09-12 LAB — 28 WEEK RH+PANEL
Basophils Absolute: 0 10*3/uL (ref 0.0–0.2)
Basos: 0 %
EOS (ABSOLUTE): 0.1 10*3/uL (ref 0.0–0.4)
Eos: 1 %
Gestational Diabetes Screen: 69 mg/dL (ref 65–139)
HIV Screen 4th Generation wRfx: NONREACTIVE
Hematocrit: 32 % — ABNORMAL LOW (ref 34.0–46.6)
Hemoglobin: 9.6 g/dL — ABNORMAL LOW (ref 11.1–15.9)
Immature Grans (Abs): 0 10*3/uL (ref 0.0–0.1)
Immature Granulocytes: 0 %
Lymphocytes Absolute: 1.2 10*3/uL (ref 0.7–3.1)
Lymphs: 17 %
MCH: 21.7 pg — ABNORMAL LOW (ref 26.6–33.0)
MCHC: 30 g/dL — ABNORMAL LOW (ref 31.5–35.7)
MCV: 72 fL — ABNORMAL LOW (ref 79–97)
Monocytes Absolute: 0.4 10*3/uL (ref 0.1–0.9)
Monocytes: 6 %
Neutrophils Absolute: 5.2 10*3/uL (ref 1.4–7.0)
Neutrophils: 76 %
Platelets: 379 10*3/uL (ref 150–450)
RBC: 4.42 x10E6/uL (ref 3.77–5.28)
RDW: 15.7 % — ABNORMAL HIGH (ref 11.7–15.4)
RPR Ser Ql: NONREACTIVE
WBC: 6.9 10*3/uL (ref 3.4–10.8)

## 2020-09-18 ENCOUNTER — Telehealth: Payer: Self-pay

## 2020-09-18 NOTE — Telephone Encounter (Signed)
Pt calling and requesting to speak with MMF directly. Very adamant  About speaking with her. fwding to MMF

## 2020-09-20 ENCOUNTER — Encounter: Payer: Self-pay | Admitting: Obstetrics

## 2020-09-20 ENCOUNTER — Other Ambulatory Visit: Payer: Self-pay

## 2020-09-20 ENCOUNTER — Ambulatory Visit (INDEPENDENT_AMBULATORY_CARE_PROVIDER_SITE_OTHER): Payer: Medicaid Other | Admitting: Obstetrics

## 2020-09-20 VITALS — BP 100/60 | Ht 59.0 in | Wt 96.0 lb

## 2020-09-20 DIAGNOSIS — R102 Pelvic and perineal pain: Secondary | ICD-10-CM

## 2020-09-20 DIAGNOSIS — Z3A27 27 weeks gestation of pregnancy: Secondary | ICD-10-CM

## 2020-09-20 DIAGNOSIS — O26892 Other specified pregnancy related conditions, second trimester: Secondary | ICD-10-CM

## 2020-09-20 DIAGNOSIS — O2242 Hemorrhoids in pregnancy, second trimester: Secondary | ICD-10-CM

## 2020-09-20 DIAGNOSIS — F419 Anxiety disorder, unspecified: Secondary | ICD-10-CM

## 2020-09-20 DIAGNOSIS — F32A Depression, unspecified: Secondary | ICD-10-CM

## 2020-09-20 DIAGNOSIS — Z3480 Encounter for supervision of other normal pregnancy, unspecified trimester: Secondary | ICD-10-CM

## 2020-09-20 DIAGNOSIS — O99341 Other mental disorders complicating pregnancy, first trimester: Secondary | ICD-10-CM

## 2020-09-20 DIAGNOSIS — Z3A08 8 weeks gestation of pregnancy: Secondary | ICD-10-CM

## 2020-09-20 MED ORDER — ESCITALOPRAM OXALATE 10 MG PO TABS: 20.0000 mg | ORAL_TABLET | Freq: Every day | ORAL | 3 refills | Status: DC

## 2020-09-20 MED ORDER — HYDROCORT-PRAMOXINE (PERIANAL) 1-1 % EX FOAM: 1.0000 | Freq: Two times a day (BID) | CUTANEOUS | Status: DC

## 2020-09-20 NOTE — Progress Notes (Signed)
Routine Prenatal Care Visit  Subjective  Michele Alexander is a 25 y.o. [phone] at [redacted]w[redacted]d being seen today for ongoing prenatal care.  She is currently monitored for the following issues for this high-risk pregnancy and has History of Hodgkin's lymphoma; Sickle cell trait ([place]); Supervision of normal pregnancy; History of postpartum depression, currently pregnant; Vaginal discharge during pregnancy in second trimester; [redacted] weeks gestation of pregnancy; and Anxiety on their problem list.  ----------------------------------------------------------------------------------- Patient reports no leaking, occasional contractions and and she complains of increased pelvic pressure. She also has a longstanding case of hemorrhoids that she has not shown any provider.    .  .   Jacklyn Shell Fluid denies.  ----------------------------------------------------------------------------------- The following portions of the patient's history were reviewed and updated as appropriate: allergies, current medications, past family history, past medical history, past social history, past surgical history and problem list. Problem list updated.  Objective  Blood pressure 100/60, height 4\' 11"  (1.499 m), weight 96 lb (43.5 kg), last menstrual period 03/09/2020, unknown if currently breastfeeding. Pregravid weight 88 lb (39.9 kg) Total Weight Gain 8 lb (3.629 kg) Urinalysis: Urine Protein    Urine Glucose    Fetal Status:           General:  Alert, oriented and cooperative. Patient is in no acute distress.  Skin: Skin is warm and dry. No rash noted.   Cardiovascular: Normal heart rate noted  Respiratory: Normal respiratory effort, no problems with respiration noted  Abdomen: Soft, gravid, appropriate for gestational age.       Pelvic:  Cervical exam performed      cervix is long and closed. Baby is high.  FFN sent  Extremities: Normal range of motion.     Mental Status: Normal mood and affect. Normal behavior. Normal judgment  and thought content.   Assessment   25 y.o. [id number] at [redacted]w[redacted]d by  12/14/2020, by Last Menstrual Period presenting for work-in prenatal visit  Plan   pregnancy #8 Problems (from 04/25/20 to present)    Problem Noted Resolved   Supervision of normal pregnancy 08/31/2018 by Octaviano Glow, RN No   Overview Addendum 07/17/2020  3:21 PM by Homero Fellers, MD      [facility]  LAB RESULTS  Language English Pap 08/31/18: neg  Initiated care at [facility] GC/CT Initial:  -/-          36wks:  Dating by LMP c/w 9wk U/S    Support person [place]: normal female    [place]/HgbE   Flu vaccine 05/18/2020 CF neg  TDaP vaccine 12/28/18  SMA neg  Rhogam Not needed Fragile X neg  Covid + during pregnancy, considering vaccination    Anatomy US complete Blood Type O/Positive/-- (03/24 1603)  Feeding Plan breast Antibody Negative (03/24 1603)  Contraception POPs HBsAg Negative (03/24 1603)  Circumcision n/a RPR Non Reactive (03/24 1603)  Pediatrician [facility] Rubella  1.01 (03/24 1603)  Prenatal Classes declines HIV Non Reactive (03/24 1603)    GTT/A1C Early:      26-28wks:  77/90/97  BTL Consent n/a GBS        [ ]  PCN allergy  VBAC Consent n/a    Waterbirth  PP Needs            Previous Version       Preterm labor symptoms and general obstetric precautions including but not limited to vaginal bleeding, contractions, leaking of fluid and fetal movement were reviewed in detail with the patient. Please refer  to After Visit Summary for other counseling recommendations.  She is very tearful today. Feeling overwhelmed by care of two young children, needing more support from her husband, and feeling the increasing peovic pressure. Her urine looks "clean"  FFN sent to the lab. Cervix is closed. Referral to [facility], Soc Work made via message. Also discussed increasing her dose of Lexapro to 20 mgs-Advised to take this at night Needs to sign BTL papers at next regular appointment in 5  days!~  Return in about 1 week (around 09/27/2020) for return OB- she has her appt set up.Imagene Riches, CNM  09/20/2020 5:25 PM

## 2020-09-21 LAB — FETAL FIBRONECTIN: Fetal Fibronectin: POSITIVE — AB

## 2020-09-23 ENCOUNTER — Encounter: Payer: Self-pay | Admitting: Obstetrics

## 2020-09-24 ENCOUNTER — Encounter: Payer: Self-pay | Admitting: Obstetrics and Gynecology

## 2020-09-24 ENCOUNTER — Inpatient Hospital Stay
Admission: RE | Admit: 2020-09-24 | Discharge: 2020-09-24 | Disposition: A | Discharge status: Home / Self Care | Payer: Medicaid Other | Attending: Obstetrics and Gynecology | Admitting: Obstetrics and Gynecology

## 2020-09-24 ENCOUNTER — Other Ambulatory Visit: Payer: Self-pay | Admitting: Obstetrics

## 2020-09-24 ENCOUNTER — Telehealth: Payer: Self-pay

## 2020-09-24 DIAGNOSIS — Z3A28 28 weeks gestation of pregnancy: Secondary | ICD-10-CM | POA: Insufficient documentation

## 2020-09-24 DIAGNOSIS — O09213 Supervision of pregnancy with history of pre-term labor, third trimester: Secondary | ICD-10-CM | POA: Diagnosis present

## 2020-09-24 DIAGNOSIS — O09899 Supervision of other high risk pregnancies, unspecified trimester: Secondary | ICD-10-CM

## 2020-09-24 DIAGNOSIS — O281 Abnormal biochemical finding on antenatal screening of mother: Secondary | ICD-10-CM | POA: Insufficient documentation

## 2020-09-24 DIAGNOSIS — R8789 Other abnormal findings in specimens from female genital organs: Secondary | ICD-10-CM

## 2020-09-24 MED ORDER — BETAMETHASONE SOD PHOS & ACET 6 (3-3) MG/ML IJ SUSP
INTRAMUSCULAR | Status: AC
  Filled 2020-09-24: qty 5

## 2020-09-24 MED ORDER — BETAMETHASONE SOD PHOS & ACET 6 (3-3) MG/ML IJ SUSP
12.0000 mg | INTRAMUSCULAR | Status: DC
  Administered 2020-09-24: 12 mg via INTRAMUSCULAR

## 2020-09-24 MED ORDER — BETAMETHASONE SOD PHOS & ACET 6 (3-3) MG/ML IJ SUSP: 12.0000 mg | INTRAMUSCULAR | Status: AC

## 2020-09-24 NOTE — OB Triage Note (Signed)
Patient arrived for 1st dose of betamethasone in L&D. Patient denies contractions, leaking of fluid, bleeding, vaginal discharge and reports positive fetal movement. Patient denies any other complaints right now. CNM aware of patient arrival and verbal order given for pt to be discharged. All questions answered and pt discharged in stable condition.

## 2020-09-24 NOTE — Progress Notes (Signed)
Patient seen in the office with c/o some increased pelvic pressure. Hx of multiple SABS, higher risk pregnancy. FFN retrieved with positive result. She has been notified and will present to labor and Delivery today (09/24/2020) for her first BMZ shot. She is aware that she will need another dose tomorrow on the 19th at [place], CNM  09/24/2020 9:30 AM

## 2020-09-24 NOTE — Telephone Encounter (Signed)
Pt calling; [employer] is trying to get leave to help her mom c the little ones; needs to talk to MMF; needs fax number.  [phone]  Pt given the fax number and told he will need to fill out paperwork to go with it.  Pt has appt tomorrow.

## 2020-09-25 ENCOUNTER — Encounter: Payer: Medicaid Other | Admitting: Obstetrics and Gynecology

## 2020-09-25 NOTE — Telephone Encounter (Signed)
Pt was seen in office.

## 2020-09-26 NOTE — Telephone Encounter (Signed)
Is this normal discharge?

## 2020-10-05 ENCOUNTER — Ambulatory Visit (INDEPENDENT_AMBULATORY_CARE_PROVIDER_SITE_OTHER): Payer: Medicaid Other | Admitting: Advanced Practice Midwife

## 2020-10-05 ENCOUNTER — Encounter: Payer: Self-pay | Admitting: Advanced Practice Midwife

## 2020-10-05 ENCOUNTER — Other Ambulatory Visit: Payer: Self-pay

## 2020-10-05 VITALS — BP 100/60 | Wt 100.0 lb

## 2020-10-05 DIAGNOSIS — Z23 Encounter for immunization: Secondary | ICD-10-CM | POA: Diagnosis not present

## 2020-10-05 DIAGNOSIS — Z3483 Encounter for supervision of other normal pregnancy, third trimester: Secondary | ICD-10-CM

## 2020-10-05 DIAGNOSIS — Z3A30 30 weeks gestation of pregnancy: Secondary | ICD-10-CM

## 2020-10-05 LAB — POCT URINALYSIS DIPSTICK OB
Glucose, UA: NEGATIVE
POC,PROTEIN,UA: NEGATIVE

## 2020-10-05 NOTE — Progress Notes (Signed)
TDAP, Blood Transfusion, BTL Signed today. Desires cervix check d/t pressure/ctx.

## 2020-10-05 NOTE — Progress Notes (Signed)
  Routine Prenatal Care Visit  Subjective  Michele Alexander is a 25 y.o. [phone] at [redacted]w[redacted]d being seen today for ongoing prenatal care.  She is currently monitored for the following issues for this low-risk pregnancy and has History of Hodgkin's lymphoma; Sickle cell trait ([place]); Supervision of normal pregnancy; History of postpartum depression, currently pregnant; Vaginal discharge during pregnancy in second trimester; [redacted] weeks gestation of pregnancy; and Anxiety on their problem list.  ----------------------------------------------------------------------------------- Patient reports increased pelvic pressure and vaginal discharge. She requests cervical exam.   Contractions: Irregular. Vag. Bleeding: None.  Movement: Present. Leaking Fluid denies.  ----------------------------------------------------------------------------------- The following portions of the patient's history were reviewed and updated as appropriate: allergies, current medications, past family history, past medical history, past social history, past surgical history and problem list. Problem list updated.  Objective  Blood pressure 100/60, weight 100 lb (45.4 kg), last menstrual period 03/09/2020 Pregravid weight 88 lb (39.9 kg) Total Weight Gain 12 lb (5.443 kg) Urinalysis: Urine Protein Negative  Urine Glucose Negative  Fetal Status: Fetal Heart Rate (bpm): 128 Fundal Height: 30 cm Movement: Present     General:  Alert, oriented and cooperative. Patient is in no acute distress.  Skin: Skin is warm and dry. No rash noted.   Cardiovascular: Normal heart rate noted  Respiratory: Normal respiratory effort, no problems with respiration noted  Abdomen: Soft, gravid, appropriate for gestational age. Pain/Pressure: Present     Pelvic:  Cervical exam performed Dilation: Closed    external os is open only  Extremities: Normal range of motion.     Mental Status: Normal mood and affect. Normal behavior. Normal judgment and thought  content.   Assessment   25 y.o. [id number] at [redacted]w[redacted]d by  12/14/2020, by Last Menstrual Period presenting for routine prenatal visit  Plan   pregnancy #8 Problems (from 04/25/20 to present)    Problem Noted Resolved   Supervision of normal pregnancy 08/31/2018 by Octaviano Glow, RN No   Overview Addendum 10/05/2020  3:33 PM by Rod Can, CNM      [facility]  LAB RESULTS  Language English Pap 08/31/18: neg  Initiated care at [facility] GC/CT Initial:  -/-          36wks:  Dating by LMP c/w 9wk U/S    Support person [place]: normal female    [place]/HgbE   Flu vaccine 05/18/2020 CF neg  TDaP vaccine 12/28/18 10/05/20 SMA neg  Rhogam Not needed Fragile X neg  Covid + during pregnancy, considering vaccination    Anatomy US complete Blood Type O/Positive/-- (03/24 1603)  Feeding Plan breast Antibody Negative (03/24 1603)  Contraception POPs HBsAg Negative (03/24 1603)  Circumcision n/a RPR Non Reactive (03/24 1603)  Pediatrician [facility] Rubella  1.01 (03/24 1603)  Prenatal Classes declines HIV Non Reactive (03/24 1603)    GTT/A1C Early:      26-28wks:  77/90/97  BTL Consent n/a GBS        [ ]  PCN allergy  VBAC Consent n/a    Waterbirth  PP Needs            Previous Version       Preterm labor symptoms and general obstetric precautions including but not limited to vaginal bleeding, contractions, leaking of fluid and fetal movement were reviewed in detail with the patient.   Return in about 2 weeks (around 10/19/2020) for rob.  Rod Can, CNM 10/05/2020 3:49 PM

## 2020-10-15 ENCOUNTER — Telehealth: Payer: Self-pay

## 2020-10-15 NOTE — Telephone Encounter (Signed)
Pt's spouse, Alessandra Bevels, calling; his job needs some king of paperwork sent to them by tomorrow or he will be fired; he is trying to get FMLA filled out by [place]; they just need something stating he needs to be out to help pt.  [phone]

## 2020-10-15 NOTE — Telephone Encounter (Signed)
faxed

## 2020-10-16 ENCOUNTER — Other Ambulatory Visit: Payer: Self-pay

## 2020-10-16 ENCOUNTER — Encounter: Payer: Self-pay | Admitting: Obstetrics and Gynecology

## 2020-10-16 ENCOUNTER — Observation Stay
Admission: EM | Admit: 2020-10-16 | Discharge: 2020-10-16 | Disposition: A | Discharge status: Home / Self Care | Payer: Medicaid Other | Attending: Obstetrics and Gynecology | Admitting: Obstetrics and Gynecology

## 2020-10-16 DIAGNOSIS — O321XX0 Maternal care for breech presentation, not applicable or unspecified: Secondary | ICD-10-CM | POA: Insufficient documentation

## 2020-10-16 DIAGNOSIS — O4703 False labor before 37 completed weeks of gestation, third trimester: Secondary | ICD-10-CM | POA: Diagnosis not present

## 2020-10-16 DIAGNOSIS — Z8571 Personal history of Hodgkin lymphoma: Secondary | ICD-10-CM | POA: Insufficient documentation

## 2020-10-16 DIAGNOSIS — Z3A31 31 weeks gestation of pregnancy: Secondary | ICD-10-CM | POA: Diagnosis not present

## 2020-10-16 DIAGNOSIS — Z3483 Encounter for supervision of other normal pregnancy, third trimester: Secondary | ICD-10-CM

## 2020-10-16 LAB — URINALYSIS, ROUTINE W REFLEX MICROSCOPIC
Bilirubin Urine: NEGATIVE
Glucose, UA: NEGATIVE mg/dL
Hgb urine dipstick: NEGATIVE
Ketones, ur: NEGATIVE mg/dL
Leukocytes,Ua: NEGATIVE
Nitrite: NEGATIVE
Protein, ur: NEGATIVE mg/dL
Specific Gravity, Urine: 1.011 (ref 1.005–1.030)
pH: 7 (ref 5.0–8.0)

## 2020-10-16 LAB — RUPTURE OF MEMBRANE (ROM)PLUS: Rom Plus: NEGATIVE

## 2020-10-16 MED ORDER — LACTATED RINGERS IV BOLUS
500.0000 mL | Freq: Once | INTRAVENOUS | Status: AC
  Administered 2020-10-16: 500 mL via INTRAVENOUS

## 2020-10-16 MED ORDER — SODIUM CHLORIDE 0.9% FLUSH: 3.0000 mL | Freq: Two times a day (BID) | INTRAVENOUS | Status: DC

## 2020-10-16 MED ORDER — SODIUM CHLORIDE 0.9% FLUSH: 3.0000 mL | INTRAVENOUS | Status: DC | PRN

## 2020-10-16 MED ORDER — SODIUM CHLORIDE 0.9 % IV SOLN: 250.0000 mL | INTRAVENOUS | Status: DC | PRN

## 2020-10-16 MED ORDER — TERBUTALINE SULFATE 1 MG/ML IJ SOLN
0.2500 mg | Freq: Once | INTRAMUSCULAR | Status: AC
  Administered 2020-10-16: 0.25 mg via SUBCUTANEOUS

## 2020-10-16 MED ORDER — TERBUTALINE SULFATE 1 MG/ML IJ SOLN
INTRAMUSCULAR | Status: AC
  Filled 2020-10-16: qty 1

## 2020-10-16 NOTE — Final Progress Note (Signed)
Final Progress Note  Patient ID: Michele Alexander MRN: [id number] DOB/AGE: 1996/03/11 25 y.o.  Admit date: 10/16/2020 Admitting provider: Homero Fellers, MD Discharge date: 10/16/2020   Admission Diagnoses: preterm contractions  Discharge Diagnoses:  Active Problems:   Preterm uterine contractions in third trimester, antepartum  vaginal leaking of fluid  History of Present Illness: The patient is a 25 y.o. female [phone] at [redacted]w[redacted]d who presents for concern regarding possible SROM . She was at home and felt a "pop" and some fluid release.  Arrived to the hospital not wearing a pad and not leaking fluid actively. Her baby has been moving well. She also c/o pelvic pressure. Denies recent IC. She denies any vaginl bleeding or spotting. Was seen in Triage a month ago, and had been set up for [place]. She received only one dose of the BMZ, missing her second appointment.  Her husband is with her.  Past Medical History:  Diagnosis Date  . Anemia   . Anxiety   . Blood transfusion without reported diagnosis   . Cancer ([place])    Hodgkin's Lymphoma  . Depression   . Hodgkin's disease(201)   . Sickle cell trait ([place])    traits    Past Surgical History:  Procedure Laterality Date  . DILATION AND EVACUATION N/A 12/21/2015   Procedure: DILATATION AND EVACUATION;  Surgeon: Will Bonnet, MD;  Location: [facility] ORS;  Service: Gynecology;  Laterality: N/A;  . NECK LESION BIOPSY    . OTHER SURGICAL HISTORY N/A 2015   Endoscopy  . PORT-A-CATH REMOVAL    . PORTACATH PLACEMENT Left 2010    No current facility-administered medications on file prior to encounter.   Current Outpatient Medications on File Prior to Encounter  Medication Sig Dispense Refill  . acetaminophen (TYLENOL) 160 MG/5ML elixir Take 15 mg/kg by mouth every 4 (four) hours as needed for fever.    . Butalbital-APAP-Caffeine 50-325-40 MG capsule Take 1-2 capsules by mouth every 6 (six) hours as needed for headache. 30 capsule 3  .  Ensure (ENSURE) Take 237 mLs by mouth 2 (two) times daily between meals. 8000 mL 12  . escitalopram (LEXAPRO) 10 MG tablet Take 2 tablets (20 mg total) by mouth daily. 60 tablet 3  . folic acid (FOLVITE) 1 MG tablet Take 1 tablet (1 mg total) by mouth daily. 30 tablet 10  . Prenatal Vit-Fe Fumarate-FA (MULTIVITAMIN-PRENATAL) 27-0.8 MG TABS tablet Take 1 tablet by mouth daily at 12 noon.      Allergies  Allergen Reactions  . Other Hives and Other (See Comments)    Oral CT contrast Oral CT contrast  . Diatrizoate Hives    PO only.  Tolerates IV contrast.  . Iodinated Diagnostic Agents Hives  . Nexium [Esomeprazole Magnesium] Swelling  . Cheese Swelling and Rash    Lip swelling/ During pregnancy only. No issues since    Social History   Socioeconomic History  . Marital status: Married    Spouse name: Information systems manager  . Number of children: 1  . Years of education: Not on file  . Highest education level: Not on file  Occupational History  . Not on file  Tobacco Use  . Smoking status: Never Smoker  . Smokeless tobacco: Never Used  Vaping Use  . Vaping Use: Never used  Substance and Sexual Activity  . Alcohol use: No  . Drug use: No  . Sexual activity: Yes    Partners: Male    Birth control/protection: Surgical    Comment: signed  Other Topics Concern  . Not on file  Social History Narrative   Right handed      Lives with husband in two story home   Social Determinants of Health   Financial Resource Strain: Not on file  Food Insecurity: Not on file  Transportation Needs: Not on file  Physical Activity: Not on file  Stress: Not on file  Social Connections: Not on file  Intimate Partner Violence: Not on file    Family History  Problem Relation Age of Onset  . Hypertension Mother   . Diabetes Maternal Grandfather   . Hypertension Maternal Grandfather   . Cancer Maternal Grandfather   . Cancer Maternal Grandmother   . Asthma Sister   . Asthma Daughter      ROS    Physical Exam: BP 100/63   Pulse 100   Temp 98.3 F (36.8 C) (Oral)   Resp 17   Ht 4\' 11"  (1.499 m)   Wt 45.4 kg   LMP 03/09/2020 (Exact Date)   BMI 20.20 kg/m   OBGyn Exam  Consults: None  Significant Findings/ Diagnostic Studies: labs: UA- unremarkable, ROM Plus is NEGATIVE.  Procedures: NST/EFM NST Baseline FHR:125 baseline  beats/min Variability: moderate Accelerations: present Decelerations: absent Tocometry: initially, regular contractions, now rare contractions  VE: 1cm/50%/floating and Breech (confirmed with bedside ultrasound)  Interpretation:  INDICATIONS: rule out uterine contractions RESULTS:  A NST procedure was performed with FHR monitoring and a normal baseline established, appropriate time of 20-40 minutes of evaluation, and accels >2 seen w 15x15 characteristics.  Results show a REACTIVE NST.    Hospital Course: The patient was admitted to Labor and Delivery Triage for observation. She was monitored for labor and given IV fluid hydration and on dose of sq Terbultaline for regular contractions. In light of her concern regarding SROM, a ROM+ was sent, and resulted in a Negative test result. A fern test was also negative, and spec exam showed no pooling. After several hours of monitoring, she was no longer contracting, and her cervix, when reexamined, had not changed. With ample fetal movement and no longer contracting, she is discharged home in care of her husband.  Discharge Condition: good  Disposition:  Discharge disposition: 01-Home or Self Care       Diet: Regular diet  Discharge Activity: No sex for 4-5 weeks Follow up in 3 days at [facility] for regular OB appointment.  Discharge Instructions    Discharge activity:  No Restrictions   Complete by: As directed    Discharge diet:  No restrictions   Complete by: As directed    Do not have sex or do anything that might make you have an orgasm   Complete by: As directed    Notify physician  for a general feeling that "something is not right"   Complete by: As directed    Notify physician for increase or change in vaginal discharge   Complete by: As directed    Notify physician for intestinal cramps, with or without diarrhea, sometimes described as "gas pain"   Complete by: As directed    Notify physician for leaking of fluid   Complete by: As directed    Notify physician for low, dull backache, unrelieved by heat or Tylenol   Complete by: As directed    Notify physician for menstrual like cramps   Complete by: As directed    Notify physician for pelvic pressure   Complete by: As directed    Notify physician for uterine  contractions.  These may be painless and feel like the uterus is tightening or the baby is  "balling up"   Complete by: As directed    Notify physician for vaginal bleeding   Complete by: As directed    PRETERM LABOR:  Includes any of the follwing symptoms that occur between 20 - [redacted] weeks gestation.  If these symptoms are not stopped, preterm labor can result in preterm delivery, placing your baby at risk   Complete by: As directed      Allergies as of 10/16/2020      Reactions   Other Hives, Other (See Comments)   Oral CT contrast Oral CT contrast   Diatrizoate Hives   PO only.  Tolerates IV contrast.   Iodinated Diagnostic Agents Hives   Nexium [esomeprazole Magnesium] Swelling   Cheese Swelling, Rash   Lip swelling/ During pregnancy only. No issues since      Medication List    TAKE these medications   acetaminophen 160 MG/5ML elixir Commonly known as: TYLENOL Take 15 mg/kg by mouth every 4 (four) hours as needed for fever.   Butalbital-APAP-Caffeine 50-325-40 MG capsule Take 1-2 capsules by mouth every 6 (six) hours as needed for headache.   Ensure Take 237 mLs by mouth 2 (two) times daily between meals.   escitalopram 10 MG tablet Commonly known as: Lexapro Take 2 tablets (20 mg total) by mouth daily.   folic acid 1 MG  tablet Commonly known as: FOLVITE Take 1 tablet (1 mg total) by mouth daily.   multivitamin-prenatal 27-0.8 MG Tabs tablet Take 1 tablet by mouth daily at 12 noon.       Follow-up Information    [facility] Follow up.   Specialty: Obstetrics and Gynecology Why: Keep previously scheduled follow up appointment on friday with [facility]. Contact information: [address] [place] [address] [phone]              Total time spent taking care of this patient: 45 minutes  Signed: Imagene Riches, CNM  10/16/2020, 8:02 AM

## 2020-10-16 NOTE — OB Triage Note (Signed)
Pt G15P2 [redacted]w[redacted]d [facility] w/ c/o LOF, pressure, and back pain. States she had a positive FFN, and has been on bed rest since.  Pt in shower at [place], felt lots of pressure, large pop, and gush of fluid, states fluid has continued to leak and pressure and back pain have continued. VS WNL. Monitors applied and assessing, FHT's 130's-140's.

## 2020-10-16 NOTE — Discharge Summary (Signed)
Please see Final Progress Note.  Imagene Riches, CNM  10/16/2020 7:48 AM

## 2020-10-16 NOTE — H&P (Signed)
Michele Alexander is a 25 y.o. female presenting for evaluation of possible rupture of membranes. She describes being at home earlier this evening with her daughter in the shower when she squatted down and felt a; "pop" and some vaginal discharge. Concerned that she could be ruptured, she drove to the hospital.  She also c/o some pelvic pressure and cramping. Her husband is with her. Upon her arrival she is not wearing a panty liner or peripad. Her underwear shows a quarter sized amount of discharge.    At [redacted] weeks gestation, this patient was seen at [facility] with c/o pelvic pressure. A FFN was positive at that time, and she was scheduled for antenaatl steroids.  She received one dose of BMZ on 09/24/2020, but did not return to the hospital for her second dose. Her medical hx is significant  for Hodgkins , depression, anemia and multiple SABS. OB History    Gravida  8   Para  2   Term  2   Preterm  0   AB  5   Living  2     SAB  5   IAB  0   Ectopic  0   Multiple  0   Live Births  2          Past Medical History:  Diagnosis Date  . Anemia   . Anxiety   . Blood transfusion without reported diagnosis   . Cancer ([place])    Hodgkin's Lymphoma  . Depression   . Hodgkin's disease(201)   . Sickle cell trait ([place])    traits   Past Surgical History:  Procedure Laterality Date  . DILATION AND EVACUATION N/A 12/21/2015   Procedure: DILATATION AND EVACUATION;  Surgeon: Will Bonnet, MD;  Location: [facility] ORS;  Service: Gynecology;  Laterality: N/A;  . NECK LESION BIOPSY    . OTHER SURGICAL HISTORY N/A 2015   Endoscopy  . PORT-A-CATH REMOVAL    . PORTACATH PLACEMENT Left 2010   Family History: family history includes Asthma in her daughter and sister; Cancer in her maternal grandfather and maternal grandmother; Diabetes in her maternal grandfather; Hypertension in her maternal grandfather and mother. Social History:  reports that she has never smoked. She has never used  smokeless tobacco. She reports that she does not drink alcohol and does not use drugs.     Maternal Diabetes: No Genetic Screening: Normal Maternal Ultrasounds/Referrals: Normal Fetal Ultrasounds or other Referrals:  None Maternal Substance Abuse:  No Significant Maternal Medications:  Meds include: Other: Lexapro Significant Maternal Lab Results:  None Other Comments:  None  Review of Systems  Constitutional: Negative.   HENT: Negative.   Eyes: Negative.   Respiratory: Negative.   Cardiovascular: Negative.   Gastrointestinal: Positive for rectal pain.       Abdomen. Fundal height 31 cm hemorroids  Endocrine: Negative.   Genitourinary: Positive for pelvic pain and vaginal discharge.  Musculoskeletal: Negative.   Skin: Negative.   Allergic/Immunologic: Negative.   Neurological: Negative.   Psychiatric/Behavioral: The patient is nervous/anxious.    History Dilation: 1 Effacement (%): 50 Station: Ballotable Exam by:: Gigi Gin CNM Blood pressure 121/77, pulse 96, temperature 98.1 F (36.7 C), temperature source Oral, resp. rate 18, height 4\' 11"  (1.499 m), weight 45.4 kg, last menstrual period 03/09/2020, unknown if currently breastfeeding. Maternal Exam:  Uterine Assessment: Contraction strength is mild.  Contraction frequency is regular.   Abdomen: Fundal height is 31 cms.   Fetal presentation: breech  Introitus: Normal  vulva. Normal vagina.  Ferning test: negative.   Cervix: Cervix evaluated by sterile speculum exam and digital exam.     Physical Exam Constitutional:      Appearance: Normal appearance. She is normal weight.  HENT:     Head: Normocephalic and atraumatic.     Nose: Nose normal.  Cardiovascular:     Rate and Rhythm: Normal rate and regular rhythm.     Pulses: Normal pulses.     Heart sounds: Normal heart sounds.  Pulmonary:     Effort: Pulmonary effort is normal.     Breath sounds: Normal breath sounds.  Abdominal:     General: Bowel  sounds are normal.     Palpations: Abdomen is soft.     Comments: Gravid abdomen. Fundal height 31 cms.  Genitourinary:    General: Normal vulva.     Comments: Sterile speculum reveals white creamy discharge. Ferning is negative. ROM + is negative Musculoskeletal:        General: Normal range of motion.     Cervical back: Normal range of motion and neck supple.  Skin:    General: Skin is warm and dry.  Neurological:     General: No focal deficit present.     Mental Status: She is alert and oriented to person, place, and time.  Psychiatric:        Mood and Affect: Mood normal.        Behavior: Behavior normal.    Bedside ultrasound shows breech presentation, with the fetal head in the maternal right upper quadrant. Several large pockets of amniotic fluid seen on ultrasound. Prenatal labs: ABO, Rh: O/Positive/-- (11/17 1521) Antibody: Negative (11/17 1521) Rubella: <0.90 (11/17 1521) RPR: Non Reactive (04/05 1026)  HBsAg: Negative (11/17 1521)  HIV: Non Reactive (04/05 1026)  GBS:     Assessment/Plan: Gravida 8 para 2,  IUP 31 weeks 4 days Preterm labor  And contractions Reactive FHTS Breech presentation ROM plus is negative. Ferning negative. Antenatal steroids given - one dose only at [redacted] weeks gestation on 09/24/2020.  Plan: EFM continuous IV fluid bolus Terbutaline 0.25mg  sq given  UA sent. Consult with NNP regarding additional dosing of BMZ.Imagene Riches 10/16/2020, 4:42 AM

## 2020-10-19 ENCOUNTER — Encounter: Payer: Medicaid Other | Admitting: Obstetrics and Gynecology

## 2020-11-02 ENCOUNTER — Observation Stay
Admission: EM | Admit: 2020-11-02 | Discharge: 2020-11-02 | Disposition: A | Discharge status: Home / Self Care | Payer: Medicaid Other | Attending: Obstetrics and Gynecology | Admitting: Obstetrics and Gynecology

## 2020-11-02 ENCOUNTER — Other Ambulatory Visit: Payer: Self-pay

## 2020-11-02 ENCOUNTER — Encounter: Payer: Self-pay | Admitting: Obstetrics and Gynecology

## 2020-11-02 DIAGNOSIS — O26853 Spotting complicating pregnancy, third trimester: Secondary | ICD-10-CM | POA: Diagnosis not present

## 2020-11-02 DIAGNOSIS — Z3A34 34 weeks gestation of pregnancy: Secondary | ICD-10-CM

## 2020-11-02 DIAGNOSIS — M545 Low back pain, unspecified: Secondary | ICD-10-CM | POA: Insufficient documentation

## 2020-11-02 DIAGNOSIS — O4703 False labor before 37 completed weeks of gestation, third trimester: Secondary | ICD-10-CM | POA: Insufficient documentation

## 2020-11-02 DIAGNOSIS — O99891 Other specified diseases and conditions complicating pregnancy: Secondary | ICD-10-CM | POA: Diagnosis present

## 2020-11-02 DIAGNOSIS — M549 Dorsalgia, unspecified: Secondary | ICD-10-CM | POA: Diagnosis present

## 2020-11-02 DIAGNOSIS — Z3483 Encounter for supervision of other normal pregnancy, third trimester: Secondary | ICD-10-CM

## 2020-11-02 DIAGNOSIS — O26893 Other specified pregnancy related conditions, third trimester: Secondary | ICD-10-CM | POA: Diagnosis present

## 2020-11-02 DIAGNOSIS — Z8571 Personal history of Hodgkin lymphoma: Secondary | ICD-10-CM | POA: Diagnosis not present

## 2020-11-02 LAB — URINALYSIS, ROUTINE W REFLEX MICROSCOPIC
Bilirubin Urine: NEGATIVE
Glucose, UA: NEGATIVE mg/dL
Hgb urine dipstick: NEGATIVE
Ketones, ur: NEGATIVE mg/dL
Nitrite: NEGATIVE
Protein, ur: NEGATIVE mg/dL
Specific Gravity, Urine: 1.014 (ref 1.005–1.030)
pH: 8 (ref 5.0–8.0)

## 2020-11-02 MED ORDER — ONDANSETRON 4 MG PO TBDP
4.0000 mg | ORAL_TABLET | Freq: Four times a day (QID) | ORAL | Status: DC | PRN
  Administered 2020-11-02: 4 mg via ORAL
  Filled 2020-11-02: qty 1

## 2020-11-02 MED ORDER — PRENATAL MULTIVITAMIN CH: 1.0000 | ORAL_TABLET | Freq: Every day | ORAL | Status: DC

## 2020-11-02 MED ORDER — ACETAMINOPHEN 325 MG PO TABS: 650.0000 mg | ORAL_TABLET | ORAL | Status: DC | PRN

## 2020-11-02 NOTE — OB Triage Note (Signed)
Pt reports contractions since 0830 this am. Reports vaginal bleeding, dime size, bright red x 1 this am. Denies LOF. Michele Alexander

## 2020-11-02 NOTE — Progress Notes (Signed)
Discharge home. Left floor ambulatory with significant other. Michele Alexander S  

## 2020-11-02 NOTE — Discharge Summary (Signed)
Final Progress Note  Patient ID: Elonda Giuliano MRN: [id number] DOB/AGE: 1995/09/24 25 y.o.  Admit date: 11/02/2020 Admitting provider: Will Bonnet, MD Discharge date: 11/02/2020   Admission Diagnoses: Back pain affecting pregnancy [redacted] weeks gestation of pregnancy  Discharge Diagnoses:  Active Problems:   Back pain affecting pregnancy   [redacted] weeks gestation of pregnancy    History of Present Illness: The patient is a 25 y.o. female [phone] at [redacted]w[redacted]d who presents with concern for back pain that started at 0830 this morning and felt consistent with contraction pain she had experienced with a previous pregnancy. She states that this contraction pain worsened for several hours. She reports nausea and vomiting associated with the contraction pain. She states that prior to presenting to the hospital she took a shower which seems to have help the contractions "space out." She reports +FM, denies LOF. She does report that yesterday she noted several episodes of urinary incontinence. She denies any additional urinary S&S including pain with voiding, hematuria, or increased urinary frequency. She does report generalized pelvic pressure.    Past Medical History:  Diagnosis Date  . Anemia   . Anxiety   . Blood transfusion without reported diagnosis   . Cancer ([place])    Hodgkin's Lymphoma  . Depression   . Hodgkin's disease(201)   . Sickle cell trait ([place])    traits    Past Surgical History:  Procedure Laterality Date  . DILATION AND EVACUATION N/A 12/21/2015   Procedure: DILATATION AND EVACUATION;  Surgeon: Will Bonnet, MD;  Location: [facility] ORS;  Service: Gynecology;  Laterality: N/A;  . NECK LESION BIOPSY    . OTHER SURGICAL HISTORY N/A 2015   Endoscopy  . PORT-A-CATH REMOVAL    . PORTACATH PLACEMENT Left 2010    No current facility-administered medications on file prior to encounter.   Current Outpatient Medications on File Prior to Encounter  Medication Sig Dispense Refill   . acetaminophen (TYLENOL) 160 MG/5ML elixir Take 15 mg/kg by mouth every 4 (four) hours as needed for fever.    . Butalbital-APAP-Caffeine 50-325-40 MG capsule Take 1-2 capsules by mouth every 6 (six) hours as needed for headache. 30 capsule 3  . Ensure (ENSURE) Take 237 mLs by mouth 2 (two) times daily between meals. 8000 mL 12  . escitalopram (LEXAPRO) 10 MG tablet Take 2 tablets (20 mg total) by mouth daily. 60 tablet 3  . folic acid (FOLVITE) 1 MG tablet Take 1 tablet (1 mg total) by mouth daily. 30 tablet 10  . Prenatal Vit-Fe Fumarate-FA (MULTIVITAMIN-PRENATAL) 27-0.8 MG TABS tablet Take 1 tablet by mouth daily at 12 noon.      Allergies  Allergen Reactions  . Other Hives and Other (See Comments)    Oral CT contrast Oral CT contrast  . Diatrizoate Hives    PO only.  Tolerates IV contrast.  . Iodinated Diagnostic Agents Hives  . Nexium [Esomeprazole Magnesium] Swelling    Social History   Socioeconomic History  . Marital status: Married    Spouse name: Information systems manager  . Number of children: 1  . Years of education: Not on file  . Highest education level: Not on file  Occupational History  . Not on file  Tobacco Use  . Smoking status: Never Smoker  . Smokeless tobacco: Never Used  Vaping Use  . Vaping Use: Never used  Substance and Sexual Activity  . Alcohol use: No  . Drug use: No  . Sexual activity: Yes    Partners:  Male    Birth control/protection: Surgical    Comment: signed  Other Topics Concern  . Not on file  Social History Narrative   Right handed      Lives with husband in two story home   Social Determinants of Health   Financial Resource Strain: Not on file  Food Insecurity: Not on file  Transportation Needs: Not on file  Physical Activity: Not on file  Stress: Not on file  Social Connections: Not on file  Intimate Partner Violence: Not on file    Family History  Problem Relation Age of Onset  . Hypertension Mother   . Diabetes Maternal Grandfather    . Hypertension Maternal Grandfather   . Cancer Maternal Grandfather   . Cancer Maternal Grandmother   . Asthma Sister   . Asthma Daughter      Review of Systems  All other systems reviewed and are negative.    Physical Exam: BP 127/85 (BP Location: Left Arm)   Pulse 93   Temp 98.1 F (36.7 C) (Oral)   Resp 16   Ht 4\' 11"  (1.499 m)   Wt 45.4 kg   LMP 03/09/2020 (Exact Date)   BMI 20.20 kg/m   Physical Exam Constitutional:      Appearance: Normal appearance.  Genitourinary:     Genitourinary Comments: SVE: 1/50/ballotable/posterior/medium consistency/vtx No bleeding noted on exam  Pulmonary:     Effort: Pulmonary effort is normal.  Abdominal:     Tenderness: There is no abdominal tenderness.     Comments: Gravid, size consistent with 32-34 week dates Vtx by Leopold's  Musculoskeletal:        General: Normal range of motion.  Neurological:     General: No focal deficit present.     Mental Status: She is alert and oriented to person, place, and time.  Psychiatric:        Mood and Affect: Mood normal.        Behavior: Behavior normal.    Consults: None  Significant Findings/ Diagnostic Studies: see results flowsheet  Procedures: NONSTRESS TEST INTERPRETATION  INDICATIONS: rule out uterine contractions FHR baseline: 120 RESULTS:  A NST procedure was performed with FHR monitoring and a normal baseline established, appropriate time of 20-40 minutes of evaluation, and accels >2 seen w 15x15 characteristics.  Results show a REACTIVE NST.   Hospital Course: The patient was admitted to Labor and Delivery Triage for observation. A UA and urine culture were obtained. No signs of UTI on UA, will wait for cx results. NST was noted to be reactive. Patient received dose of zofran for nausea. During her stay, the patient reported her contractions had stopped and was no longer feeling the acute back pain associated with them. Her SVE was reassuring that she was not actively  experiencing preterm labor. Her symptoms of nausea improved following medication. Plan was made for discharge home. Patient cancelled last OB visit and had no further follow-up scheduled - message sent to office to schedule patient for ROB in 1-2 weeks. Patient was discharged home in stable condition with preterm labor precautions.  Discharge Condition: good  Disposition: Discharge disposition: 01-Home or Self Care  Diet: Regular diet  Discharge Activity: Activity as tolerated   Allergies as of 11/02/2020      Reactions   Other Hives, Other (See Comments)   Oral CT contrast Oral CT contrast   Diatrizoate Hives   PO only.  Tolerates IV contrast.   Iodinated Diagnostic Agents Hives   Nexium [  esomeprazole Magnesium] Swelling      Medication List    TAKE these medications   acetaminophen 160 MG/5ML elixir Commonly known as: TYLENOL Take 15 mg/kg by mouth every 4 (four) hours as needed for fever.   Butalbital-APAP-Caffeine 50-325-40 MG capsule Take 1-2 capsules by mouth every 6 (six) hours as needed for headache.   Ensure Take 237 mLs by mouth 2 (two) times daily between meals.   escitalopram 10 MG tablet Commonly known as: Lexapro Take 2 tablets (20 mg total) by mouth daily.   folic acid 1 MG tablet Commonly known as: FOLVITE Take 1 tablet (1 mg total) by mouth daily.   multivitamin-prenatal 27-0.8 MG Tabs tablet Take 1 tablet by mouth daily at 12 noon.        Total time spent taking care of this patient: 20 minutes  Signed:  Orlie Pollen, CNM 11/02/2020, 2:07 PM

## 2020-11-04 LAB — URINE CULTURE: Culture: 70000 — AB

## 2020-11-07 ENCOUNTER — Ambulatory Visit (INDEPENDENT_AMBULATORY_CARE_PROVIDER_SITE_OTHER): Payer: Medicaid Other | Admitting: Obstetrics and Gynecology

## 2020-11-07 ENCOUNTER — Other Ambulatory Visit: Payer: Self-pay

## 2020-11-07 VITALS — BP 100/64 | Wt 102.0 lb

## 2020-11-07 DIAGNOSIS — Z3A34 34 weeks gestation of pregnancy: Secondary | ICD-10-CM

## 2020-11-07 DIAGNOSIS — Z3483 Encounter for supervision of other normal pregnancy, third trimester: Secondary | ICD-10-CM

## 2020-11-07 LAB — POCT URINALYSIS DIPSTICK OB
Glucose, UA: NEGATIVE
POC,PROTEIN,UA: NEGATIVE

## 2020-11-07 NOTE — Addendum Note (Signed)
Addended by: Drenda Freeze on: 11/07/2020 04:05 PM   Modules accepted: Orders

## 2020-11-07 NOTE — Progress Notes (Signed)
Routine Prenatal Care Visit  Subjective  Michele Alexander is a 25 y.o. [phone] at [redacted]w[redacted]d being seen today for ongoing prenatal care.  She is currently monitored for the following issues for this low-risk pregnancy and has History of Hodgkin's lymphoma; Sickle cell trait ([place]); Supervision of normal pregnancy; History of postpartum depression, currently pregnant; Anxiety; and [redacted] weeks gestation of pregnancy on their problem list.  ----------------------------------------------------------------------------------- Patient reports pelvic pressure and pain. She reports pain at pubic symphsis. She has been experiencing siatica intermittently..   Contractions: Irritability. Vag. Bleeding: None.  Movement: Present. Denies leaking of fluid.  ----------------------------------------------------------------------------------- The following portions of the patient's history were reviewed and updated as appropriate: allergies, current medications, past family history, past medical history, past social history, past surgical history and problem list. Problem list updated.   Objective  Blood pressure 100/64, weight 102 lb (46.3 kg), last menstrual period 03/09/2020, unknown if currently breastfeeding. Pregravid weight 88 lb (39.9 kg) Total Weight Gain 14 lb (6.35 kg) Urinalysis:      Fetal Status: Fetal Heart Rate (bpm): 140 Fundal Height: 34 cm Movement: Present  Presentation: Vertex  General:  Alert, oriented and cooperative. Patient is in no acute distress.  Skin: Skin is warm and dry. No rash noted.   Cardiovascular: Normal heart rate noted  Respiratory: Normal respiratory effort, no problems with respiration noted  Abdomen: Soft, gravid, appropriate for gestational age. Pain/Pressure: Present     Pelvic:  Cervical exam deferred        Extremities: Normal range of motion.  Edema: None  ental Status: Normal mood and affect. Normal behavior. Normal judgment and thought content.   Assessment   25  y.o. [id number] at [redacted]w[redacted]d by  12/14/2020, by Last Menstrual Period presenting for routine prenatal visit  Plan   pregnancy #8 Problems (from 04/25/20 to present)    Problem Noted Resolved   Supervision of normal pregnancy 08/31/2018 by Octaviano Glow, RN No   Overview Addendum 11/07/2020  3:58 PM by Orlie Pollen, CNM      [facility]  LAB RESULTS  Language English Pap 08/31/18: neg  Initiated care at [facility] GC/CT Initial:  -/-          36wks:  Dating by LMP c/w 9wk U/S    Support person [place]: normal female    [place]/HgbE   Flu vaccine 05/18/2020 CF neg  TDaP vaccine 10/05/20 SMA neg  Rhogam Not needed Fragile X neg  Covid + during pregnancy, considering vaccination    Anatomy US complete Blood Type O/Positive/-- (03/24 1603)  Feeding Plan breast Antibody Negative (03/24 1603)  Contraception  BTL - consents signed HBsAg Negative (03/24 1603)  Circumcision n/a RPR Non Reactive (03/24 1603)  Pediatrician [facility] Rubella  1.01 (03/24 1603)  Prenatal Classes declines HIV Non Reactive (03/24 1603)    GTT/A1C Early:      26-28wks:  77/90/97  BTL Consent  signed GBS        [ ]  PCN allergy  VBAC Consent n/a    Waterbirth  PP Needs            Previous Version       Preterm obstetric precautions including but not limited to vaginal bleeding, contractions, leaking of fluid and fetal movement were reviewed in detail with the patient.    Return in about 2 weeks (around 11/21/2020) for ROB with GBS/aptima (prefers not with MMF).  Orlie Pollen, CNM, MSN [facility] 11/07/2020, 3:59  PM

## 2020-11-21 ENCOUNTER — Ambulatory Visit (INDEPENDENT_AMBULATORY_CARE_PROVIDER_SITE_OTHER): Payer: Medicaid Other | Admitting: Obstetrics and Gynecology

## 2020-11-21 ENCOUNTER — Other Ambulatory Visit (HOSPITAL_COMMUNITY)
Admission: RE | Admit: 2020-11-21 | Discharge: 2020-11-21 | Disposition: A | Discharge status: Home / Self Care | Payer: Medicaid Other | Source: Ambulatory Visit | Attending: Obstetrics and Gynecology | Admitting: Obstetrics and Gynecology

## 2020-11-21 ENCOUNTER — Encounter: Payer: Self-pay | Admitting: Obstetrics and Gynecology

## 2020-11-21 ENCOUNTER — Other Ambulatory Visit: Payer: Self-pay

## 2020-11-21 VITALS — BP 110/60 | Wt 104.0 lb

## 2020-11-21 DIAGNOSIS — Z113 Encounter for screening for infections with a predominantly sexual mode of transmission: Secondary | ICD-10-CM | POA: Insufficient documentation

## 2020-11-21 DIAGNOSIS — R12 Heartburn: Secondary | ICD-10-CM

## 2020-11-21 DIAGNOSIS — O26893 Other specified pregnancy related conditions, third trimester: Secondary | ICD-10-CM

## 2020-11-21 DIAGNOSIS — Z3483 Encounter for supervision of other normal pregnancy, third trimester: Secondary | ICD-10-CM

## 2020-11-21 DIAGNOSIS — Z3685 Encounter for antenatal screening for Streptococcus B: Secondary | ICD-10-CM

## 2020-11-21 LAB — POCT URINALYSIS DIPSTICK OB: Glucose, UA: NEGATIVE

## 2020-11-21 LAB — OB RESULTS CONSOLE GC/CHLAMYDIA: Gonorrhea: NEGATIVE

## 2020-11-21 MED ORDER — SUCRALFATE 1 G PO TABS: 1.0000 g | ORAL_TABLET | Freq: Three times a day (TID) | ORAL | 0 refills | Status: DC

## 2020-11-21 NOTE — Progress Notes (Signed)
Routine Prenatal Care Visit  Subjective  Michele Alexander is a 25 y.o. [phone] at [redacted]w[redacted]d being seen today for ongoing prenatal care.  She is currently monitored for the following issues for this low-risk pregnancy and has History of Hodgkin's lymphoma; Sickle cell trait ([place]); Supervision of normal pregnancy; History of postpartum depression, currently pregnant; Anxiety; and [redacted] weeks gestation of pregnancy on their problem list.  ----------------------------------------------------------------------------------- Patient reports heartburn.  Patient reports Pepcid has not be effective in treating her GERD. Contractions: Irritability. Vag. Bleeding: None.  Movement: Present. Denies leaking of fluid.  ----------------------------------------------------------------------------------- The following portions of the patient's history were reviewed and updated as appropriate: allergies, current medications, past family history, past medical history, past social history, past surgical history and problem list. Problem list updated.   Objective  Blood pressure 110/60, weight 104 lb (47.2 kg), last menstrual period 03/09/2020, unknown if currently breastfeeding. Pregravid weight 88 lb (39.9 kg) Total Weight Gain 16 lb (7.258 kg) Urinalysis:      Fetal Status: Fetal Heart Rate (bpm): 150 Fundal Height: 36 cm Movement: Present  Presentation: Vertex  General:  Alert, oriented and cooperative. Patient is in no acute distress.  Skin: Skin is warm and dry. No rash noted.   Cardiovascular: Normal heart rate noted  Respiratory: Normal respiratory effort, no problems with respiration noted  Abdomen: Soft, gravid, appropriate for gestational age. Pain/Pressure: Present     Pelvic:  Cervical exam performed Dilation: 1 Effacement (%): 50 Station: -2  Extremities: Normal range of motion.  Edema: None  ental Status: Normal mood and affect. Normal behavior. Normal judgment and thought content.     Assessment    25 y.o. [id number] at [redacted]w[redacted]d by  12/14/2020, by Last Menstrual Period presenting for routine prenatal visit  Plan   pregnancy #8 Problems (from 04/25/20 to present)     Problem Noted Resolved   Supervision of normal pregnancy 08/31/2018 by Octaviano Glow, RN No   Overview Addendum 11/07/2020  3:58 PM by Orlie Pollen, CNM      [facility]  LAB RESULTS  Language English Pap 08/31/18: neg  [facility]iated [facility] at [facility] GC/CT Initial:  -/-          36wks:  Dating by LMP c/w 9wk U/S    Support person [place]: normal female    [place]/HgbE   Flu vaccine 05/18/2020 CF neg  TDaP vaccine 10/05/20 SMA neg  Rhogam Not needed Fragile X neg  Covid + during pregnancy, considering vaccination    Anatomy US complete Blood Type O/Positive/-- (03/24 1603)  Feeding Plan breast Antibody Negative (03/24 1603)  Contraception  BTL - consents signed HBsAg Negative (03/24 1603)  Circumcision n/a RPR Non Reactive (03/24 1603)  Pediatrician [facility] Rubella  1.01 (03/24 1603)  Prenatal Classes declines HIV Non Reactive (03/24 1603)    GTT/A1C Early:      26-28wks:  77/90/97  BTL Consent  signed GBS        [ ]  PCN allergy  VBAC Consent n/a    Waterbirth  PP Needs                -Sucralfate for heartburn -GBS/aptima collected  Preterm obstetric precautions including but not limited to vaginal bleeding, contractions, leaking of fluid and fetal movement were reviewed in detail with the patient.    Return in about 1 week (around 11/28/2020) for ROB (can schedule out for two weeks).  Orlie Pollen, CNM, MSN [facility] 11/21/2020,  4:55 PM

## 2020-11-23 LAB — CERVICOVAGINAL ANCILLARY ONLY
Chlamydia: NEGATIVE
Comment: NEGATIVE
Comment: NORMAL
Neisseria Gonorrhea: NEGATIVE

## 2020-11-24 LAB — STREP GP B NAA: Strep Gp B NAA: POSITIVE — AB

## 2020-11-26 ENCOUNTER — Other Ambulatory Visit: Payer: Self-pay

## 2020-11-26 ENCOUNTER — Encounter: Payer: Self-pay | Admitting: Advanced Practice Midwife

## 2020-11-26 ENCOUNTER — Telehealth: Payer: Self-pay

## 2020-11-26 ENCOUNTER — Observation Stay
Admission: EM | Admit: 2020-11-26 | Discharge: 2020-11-26 | Disposition: A | Discharge status: Home / Self Care | Payer: Medicaid Other | Attending: Advanced Practice Midwife | Admitting: Advanced Practice Midwife

## 2020-11-26 DIAGNOSIS — Z20822 Contact with and (suspected) exposure to covid-19: Secondary | ICD-10-CM | POA: Diagnosis not present

## 2020-11-26 DIAGNOSIS — O99891 Other specified diseases and conditions complicating pregnancy: Secondary | ICD-10-CM

## 2020-11-26 DIAGNOSIS — D649 Anemia, unspecified: Secondary | ICD-10-CM | POA: Diagnosis not present

## 2020-11-26 DIAGNOSIS — Z3483 Encounter for supervision of other normal pregnancy, third trimester: Secondary | ICD-10-CM

## 2020-11-26 DIAGNOSIS — R110 Nausea: Secondary | ICD-10-CM

## 2020-11-26 DIAGNOSIS — O99013 Anemia complicating pregnancy, third trimester: Secondary | ICD-10-CM | POA: Diagnosis not present

## 2020-11-26 DIAGNOSIS — O99283 Endocrine, nutritional and metabolic diseases complicating pregnancy, third trimester: Secondary | ICD-10-CM

## 2020-11-26 DIAGNOSIS — E876 Hypokalemia: Secondary | ICD-10-CM | POA: Diagnosis not present

## 2020-11-26 DIAGNOSIS — Z3A37 37 weeks gestation of pregnancy: Secondary | ICD-10-CM

## 2020-11-26 DIAGNOSIS — Z3490 Encounter for supervision of normal pregnancy, unspecified, unspecified trimester: Secondary | ICD-10-CM

## 2020-11-26 DIAGNOSIS — Z8571 Personal history of Hodgkin lymphoma: Secondary | ICD-10-CM | POA: Insufficient documentation

## 2020-11-26 DIAGNOSIS — O219 Vomiting of pregnancy, unspecified: Secondary | ICD-10-CM | POA: Diagnosis not present

## 2020-11-26 DIAGNOSIS — O212 Late vomiting of pregnancy: Principal | ICD-10-CM | POA: Insufficient documentation

## 2020-11-26 LAB — COMPREHENSIVE METABOLIC PANEL
ALT: 14 U/L (ref 0–44)
AST: 22 U/L (ref 15–41)
Albumin: 2.9 g/dL — ABNORMAL LOW (ref 3.5–5.0)
Alkaline Phosphatase: 218 U/L — ABNORMAL HIGH (ref 38–126)
Anion gap: 9 (ref 5–15)
BUN: 6 mg/dL (ref 6–20)
CO2: 23 mmol/L (ref 22–32)
Calcium: 8.3 mg/dL — ABNORMAL LOW (ref 8.9–10.3)
Chloride: 104 mmol/L (ref 98–111)
Creatinine, Ser: 0.48 mg/dL (ref 0.44–1.00)
GFR, Estimated: >60 mL/min (ref >60)
Glucose, Bld: 69 mg/dL — ABNORMAL LOW (ref 70–99)
Potassium: 3 mmol/L — ABNORMAL LOW (ref 3.5–5.1)
Sodium: 136 mmol/L (ref 135–145)
Total Bilirubin: 0.8 mg/dL (ref 0.3–1.2)
Total Protein: 6.3 g/dL — ABNORMAL LOW (ref 6.5–8.1)

## 2020-11-26 LAB — CBC
HCT: 29.5 % — ABNORMAL LOW (ref 36.0–46.0)
Hemoglobin: 8.9 g/dL — ABNORMAL LOW (ref 12.0–15.0)
MCH: 20.2 pg — ABNORMAL LOW (ref 26.0–34.0)
MCHC: 30.2 g/dL (ref 30.0–36.0)
MCV: 67 fL — ABNORMAL LOW (ref 80.0–100.0)
Platelets: 301 10*3/uL (ref 150–400)
RBC: 4.4 MIL/uL (ref 3.87–5.11)
RDW: 17.8 % — ABNORMAL HIGH (ref 11.5–15.5)
WBC: 6.3 10*3/uL (ref 4.0–10.5)
nRBC: 0 % (ref 0.0–0.2)

## 2020-11-26 LAB — URINALYSIS, COMPLETE (UACMP) WITH MICROSCOPIC
Bilirubin Urine: NEGATIVE
Glucose, UA: NEGATIVE mg/dL
Hgb urine dipstick: NEGATIVE
Ketones, ur: 5 mg/dL — AB
Nitrite: NEGATIVE
Protein, ur: NEGATIVE mg/dL
Specific Gravity, Urine: 1.017 (ref 1.005–1.030)
pH: 7 (ref 5.0–8.0)

## 2020-11-26 LAB — RESP PANEL BY RT-PCR (FLU A&B, COVID) ARPGX2
Influenza A by PCR: NEGATIVE
Influenza B by PCR: NEGATIVE
SARS Coronavirus 2 by RT PCR: NEGATIVE

## 2020-11-26 MED ORDER — LACTATED RINGERS IV BOLUS
500.0000 mL | Freq: Once | INTRAVENOUS | Status: AC
  Administered 2020-11-26: 500 mL via INTRAVENOUS

## 2020-11-26 MED ORDER — POTASSIUM CHLORIDE 10 MEQ/100ML IV SOLN
10.0000 meq | Freq: Once | INTRAVENOUS | Status: AC
  Administered 2020-11-26: 10 meq via INTRAVENOUS
  Filled 2020-11-26: qty 100

## 2020-11-26 MED ORDER — ACETAMINOPHEN 325 MG PO TABS: 650.0000 mg | ORAL_TABLET | Freq: Once | ORAL | Status: DC

## 2020-11-26 MED ORDER — ONDANSETRON HCL 4 MG/2ML IJ SOLN
INTRAMUSCULAR | Status: AC
  Administered 2020-11-26: 4 mg via INTRAVENOUS
  Filled 2020-11-26: qty 2

## 2020-11-26 MED ORDER — CALCIUM CARBONATE ANTACID 500 MG PO CHEW
400.0000 mg | CHEWABLE_TABLET | Freq: Once | ORAL | Status: AC
  Administered 2020-11-26: 400 mg via ORAL
  Filled 2020-11-26: qty 2

## 2020-11-26 MED ORDER — ONDANSETRON HCL 4 MG/2ML IJ SOLN: 4.0000 mg | Freq: Four times a day (QID) | INTRAMUSCULAR | Status: DC

## 2020-11-26 MED ORDER — LACTATED RINGERS IV SOLN: INTRAVENOUS | Status: DC

## 2020-11-26 NOTE — Progress Notes (Signed)
Pt states inability to take prescribed meds the past two days due to vomiting.

## 2020-11-26 NOTE — Discharge Summary (Signed)
Physician Final Progress Note  Patient ID: Michele Alexander MRN: [id number] DOB/AGE: 01-Jul-1995 25 y.o.  Admit date: 11/26/2020 Admitting provider: Rod Can, CNM Discharge date: 11/26/2020   Admission Diagnoses: Nausea and vomiting  Discharge Diagnoses:  Active Problems:   Nausea and vomiting during pregnancy   Supervision of normal pregnancy   Labor and delivery, indication for care   [redacted] weeks gestation of pregnancy  Hypokalemia  Anemia  History of Present Illness: The patient is a 25 y.o. female [id number] at [redacted]w[redacted]d who presents for being unable to keep any food or drink down in the past day. She has been nauseated for the past couple days. She also has a headache associated with the nausea/vomiting. She denies any fever, body aches, congestion, diarrhea. She admits ongoing constipation. She admits good fetal movement and has had irregular contractions since she was seen in clinic 1 week ago. She denies vaginal bleeding or leakage of fluid.   She was admitted for observation, placed on monitors, labs drawn, IV started, IV fluids, potassium chloride, and anti emetics administered. Diet advanced. Monitoring reassuring, labs with some minor irregularities- hypokalemia, anemia. Patient with improved symptoms and able to tolerate PO. She was discharged to home in good condition with instructions and precautions.   Past Medical History:  Diagnosis Date   Anemia    Anxiety    Blood transfusion without reported diagnosis    Cancer ([place])    Hodgkin's Lymphoma   Depression    Hodgkin's disease(201)    Sickle cell trait ([place])    traits    Past Surgical History:  Procedure Laterality Date   DILATION AND EVACUATION N/A 12/21/2015   Procedure: DILATATION AND EVACUATION;  Surgeon: Will Bonnet, MD;  Location: [facility] ORS;  Service: Gynecology;  Laterality: N/A;   NECK LESION BIOPSY     OTHER SURGICAL HISTORY N/A 2015   Endoscopy   PORT-A-CATH REMOVAL     PORTACATH PLACEMENT Left 2010     No current facility-administered medications on file prior to encounter.   Current Outpatient Medications on File Prior to Encounter  Medication Sig Dispense Refill   acetaminophen (TYLENOL) 160 MG/5ML elixir Take 15 mg/kg by mouth every 4 (four) hours as needed for fever.     Butalbital-APAP-Caffeine 50-325-40 MG capsule Take 1-2 capsules by mouth every 6 (six) hours as needed for headache. 30 capsule 3   Ensure (ENSURE) Take 237 mLs by mouth 2 (two) times daily between meals. 8000 mL 12   escitalopram (LEXAPRO) 20 MG tablet Take 1 tablet by mouth daily.     folic acid (FOLVITE) 1 MG tablet Take 1 tablet (1 mg total) by mouth daily. 30 tablet 10   Prenatal Vit-Fe Fumarate-FA (MULTIVITAMIN-PRENATAL) 27-0.8 MG TABS tablet Take 1 tablet by mouth daily at 12 noon.     sucralfate (CARAFATE) 1 g tablet Take 1 tablet (1 g total) by mouth 4 (four) times daily -  with meals and at bedtime. 120 tablet 0    Allergies  Allergen Reactions   Other Hives and Other (See Comments)    Oral CT contrast Oral CT contrast   Diatrizoate Hives    PO only.  Tolerates IV contrast.   Iodinated Diagnostic Agents Hives   Nexium [Esomeprazole Magnesium] Swelling    Social History   Socioeconomic History   Marital status: Married    Spouse name: Dylan   Number of children: 1   Years of education: Not on file   Highest education level: Not on file  Occupational History   Not on file  Tobacco Use   Smoking status: Never   Smokeless tobacco: Never  Vaping Use   Vaping Use: Never used  Substance and Sexual Activity   Alcohol use: No   Drug use: No   Sexual activity: Yes    Partners: Male    Birth control/protection: Surgical    Comment: signed  Other Topics Concern   Not on file  Social History Narrative   Right handed      Lives with husband in two story home   Social Determinants of Health   Financial Resource Strain: Not on file  Food Insecurity: Not on file  Transportation Needs: Not  on file  Physical Activity: Not on file  Stress: Not on file  Social Connections: Not on file  Intimate Partner Violence: Not on file    Family History  Problem Relation Age of Onset   Hypertension Mother    Diabetes Maternal Grandfather    Hypertension Maternal Grandfather    Cancer Maternal Grandfather    Cancer Maternal Grandmother    Asthma Sister    Asthma Daughter      Review of Systems  Constitutional:  Negative for chills and fever.  HENT:  Negative for congestion, ear discharge, ear pain, hearing loss, sinus pain and sore throat.   Eyes:  Negative for blurred vision and double vision.  Respiratory:  Negative for cough, shortness of breath and wheezing.   Cardiovascular:  Negative for chest pain, palpitations and leg swelling.  Gastrointestinal:  Positive for constipation, nausea and vomiting. Negative for abdominal pain, blood in stool, diarrhea, heartburn and melena.  Genitourinary:  Negative for dysuria, flank pain, frequency, hematuria and urgency.  Musculoskeletal:  Negative for back pain, joint pain and myalgias.  Skin:  Negative for itching and rash.  Neurological:  Positive for headaches. Negative for dizziness, tingling, tremors, sensory change, speech change, focal weakness, seizures, loss of consciousness and weakness.  Endo/Heme/Allergies:  Negative for environmental allergies. Does not bruise/bleed easily.  Psychiatric/Behavioral:  Negative for depression, hallucinations, memory loss, substance abuse and suicidal ideas. The patient is not nervous/anxious and does not have insomnia.     Physical Exam: BP 120/61 (BP Location: Right Arm)   Pulse 83   Temp 98.2 F (36.8 C) (Oral)   Resp 14   Ht 4\' 11"  (1.499 m)   Wt 47.2 kg   LMP 03/09/2020 (Exact Date)   BMI 21.01 kg/m   Constitutional: Well nourished, well developed female in no acute distress.  HEENT: normal Skin: Warm and dry.  Cardiovascular: Regular rate and rhythm.   Extremity:  no edema    Respiratory: Clear to auscultation bilateral. Normal respiratory effort Abdomen: FHT present Back: no CVAT Neuro: DTRs 2+, Cranial nerves grossly intact Psych: Alert and Oriented x3. No memory deficits. Normal mood and affect.  MS: normal gait, normal bilateral lower extremity ROM/strength/stability.  Pelvic exam: (female chaperone present) is not limited by body habitus EGBUS: within normal limits Vagina: within normal limits and with normal mucosa  Cervix: 2/50/-3  Toco: every 10-15 minutes lasting 30 seconds,  Fetal well being: 130 bpm, moderate variability, +accelerations, -decelerations   Consults: None  Significant Findings/ Diagnostic Studies: labs:  Results for SHELINA, LUO (MRN [id number]) as of 11/26/2020 22:23  Ref. Range 11/26/2020 17:54 11/26/2020 17:59  COMPREHENSIVE METABOLIC PANEL Unknown Rpt (A)   Sodium Latest Ref Range: 135 - 145 mmol/L 136   Potassium Latest Ref Range: 3.5 - 5.1 mmol/L 3.0 (L)  Chloride Latest Ref Range: 98 - 111 mmol/L 104   CO2 Latest Ref Range: 22 - 32 mmol/L 23   Glucose Latest Ref Range: 70 - 99 mg/dL 69 (L)   BUN Latest Ref Range: 6 - 20 mg/dL 6   Creatinine Latest Ref Range: 0.44 - 1.00 mg/dL 0.48   Calcium Latest Ref Range: 8.9 - 10.3 mg/dL 8.3 (L)   Anion gap Latest Ref Range: 5 - 15  9   Alkaline Phosphatase Latest Ref Range: 38 - 126 U/L 218 (H)   Albumin Latest Ref Range: 3.5 - 5.0 g/dL 2.9 (L)   AST Latest Ref Range: 15 - 41 U/L 22   ALT Latest Ref Range: 0 - 44 U/L 14   Total Protein Latest Ref Range: 6.5 - 8.1 g/dL 6.3 (L)   Total Bilirubin Latest Ref Range: 0.3 - 1.2 mg/dL 0.8   GFR, Estimated Latest Ref Range: >60 mL/min >60   WBC Latest Ref Range: 4.0 - 10.5 K/uL 6.3   RBC Latest Ref Range: 3.87 - 5.11 MIL/uL 4.40   Hemoglobin Latest Ref Range: 12.0 - 15.0 g/dL 8.9 (L)   HCT Latest Ref Range: 36.0 - 46.0 % 29.5 (L)   MCV Latest Ref Range: 80.0 - 100.0 fL 67.0 (L)   MCH Latest Ref Range: 26.0 - 34.0 pg 20.2 (L)    MCHC Latest Ref Range: 30.0 - 36.0 g/dL 30.2   RDW Latest Ref Range: 11.5 - 15.5 % 17.8 (H)   Platelets Latest Ref Range: 150 - 400 K/uL 301   nRBC Latest Ref Range: 0.0 - 0.2 % 0.0   RESP PANEL BY RT-PCR (FLU A&B, COVID) ARPGX2 Unknown  Rpt  Influenza A By PCR Latest Ref Range: NEGATIVE   NEGATIVE  Influenza B By PCR Latest Ref Range: NEGATIVE   NEGATIVE  SARS Coronavirus 2 by RT PCR Latest Ref Range: NEGATIVE   NEGATIVE  Appearance Latest Ref Range: CLEAR   CLEAR (A)  Bilirubin Urine Latest Ref Range: NEGATIVE   NEGATIVE  Color, Urine Latest Ref Range: YELLOW   YELLOW (A)  Glucose, UA Latest Ref Range: NEGATIVE mg/dL  NEGATIVE  Hgb urine dipstick Latest Ref Range: NEGATIVE   NEGATIVE  Ketones, ur Latest Ref Range: NEGATIVE mg/dL  5 (A)  Leukocytes,Ua Latest Ref Range: NEGATIVE   SMALL (A)  Nitrite Latest Ref Range: NEGATIVE   NEGATIVE  pH Latest Ref Range: 5.0 - 8.0   7.0  Protein Latest Ref Range: NEGATIVE mg/dL  NEGATIVE  Specific Gravity, Urine Latest Ref Range: 1.005 - 1.030   1.017  Bacteria, UA Latest Ref Range: NONE SEEN   RARE (A)  Mucus Unknown  PRESENT  RBC / HPF Latest Ref Range: 0 - 5 RBC/hpf  0-5  Squamous Epithelial / LPF Latest Ref Range: 0 - 5   0-5  WBC, UA Latest Ref Range: 0 - 5 WBC/hpf  6-10    Procedures: NST  Hospital Course: The patient was admitted to Labor and Delivery Triage for observation.   Discharge Condition: good  Disposition: Discharge disposition: 01-Home or Self Care  Diet: Regular diet  Discharge Activity: Activity as tolerated   Discharge Instructions     Discharge activity:  No Restrictions   Complete by: As directed    Discharge diet:  No restrictions   Complete by: As directed    Stay well hydrated, take prenatal vitamin with iron, eat iron rich foods   LABOR:  When conractions begin, you should start to time  them from the beginning of one contraction to the beginning  of the next.  When contractions are 5 - 10 minutes apart or  less and have been regular for at least an hour, you should call your health care provider.   Complete by: As directed    No sexual activity restrictions   Complete by: As directed    Notify physician for bleeding from the vagina   Complete by: As directed    Notify physician for blurring of vision or spots before the eyes   Complete by: As directed    Notify physician for chills or fever   Complete by: As directed    Notify physician for fainting spells, "black outs" or loss of consciousness   Complete by: As directed    Notify physician for increase in vaginal discharge   Complete by: As directed    Notify physician for leaking of fluid   Complete by: As directed    Notify physician for pain or burning when urinating   Complete by: As directed    Notify physician for pelvic pressure (sudden increase)   Complete by: As directed    Notify physician for severe or continued nausea or vomiting   Complete by: As directed    Notify physician for sudden gushing of fluid from the vagina (with or without continued leaking)   Complete by: As directed    Notify physician for sudden, constant, or occasional abdominal pain   Complete by: As directed    Notify physician if baby moving less than usual   Complete by: As directed       Allergies as of 11/26/2020       Reactions   Other Hives, Other (See Comments)   Oral CT contrast Oral CT contrast   Diatrizoate Hives   PO only.  Tolerates IV contrast.   Iodinated Diagnostic Agents Hives   Nexium [esomeprazole Magnesium] Swelling        Medication List     TAKE these medications    acetaminophen 160 MG/5ML elixir Commonly known as: TYLENOL Take 15 mg/kg by mouth every 4 (four) hours as needed for fever.   Butalbital-APAP-Caffeine 50-325-40 MG capsule Take 1-2 capsules by mouth every 6 (six) hours as needed for headache.   Ensure Take 237 mLs by mouth 2 (two) times daily between meals.   escitalopram 20 MG tablet Commonly  known as: LEXAPRO Take 1 tablet by mouth daily.   folic acid 1 MG tablet Commonly known as: FOLVITE Take 1 tablet (1 mg total) by mouth daily.   multivitamin-prenatal 27-0.8 MG Tabs tablet Take 1 tablet by mouth daily at 12 noon.   sucralfate 1 g tablet Commonly known as: Carafate Take 1 tablet (1 g total) by mouth 4 (four) times daily -  with meals and at bedtime.        [place]. Go to.   Specialty: Obstetrics and Gynecology Why: scheduled prenatal appointment Contact information: [address] [place] [address] [phone]                Total time spent taking care of this patient: 25 minutes  Signed: Rod Can, CNM  11/26/2020, 10:16 PM

## 2020-11-26 NOTE — Progress Notes (Signed)
Pt has advance to regular diet and tolerated. Pt to be d/c home and to self care. Pt given teaching on when to seek medical attention (LOF, VB and decreased FM, etc..). Pt verbalized understanding of d/c instructions.

## 2020-11-26 NOTE — Telephone Encounter (Signed)
Pt tx'd from SP; 37wks; vomiting and dec FM.  Pt states she has headache, can't keep anything down; everything she puts in come back up; hasn't felt baby at all today.  Adv to go to L&D via ED. No covid. [place] notified.

## 2020-11-26 NOTE — OB Triage Note (Signed)
Pt presented to L/D triage with reported N/V that began 2 days ago. She reports an inability to tolerate solid foods or liquid. Pt reports ongoing mucus in stools, but no diarrhea. Pt reports ongoing heartburn and weakness. Pt reports no bleeding or LOF. Pt states that she has felt decreased fetal movement since this morning. Upon assessment, RN noted FHT of 145 and visual fetal movement with kicks. Pt felt movement at this time. Pt reports ongoing headache, rated 6/10, that worsens with vomiting. Abdomen soft and non-tender.  VSS. Monitors applied and assessing.

## 2020-11-28 ENCOUNTER — Observation Stay
Admission: EM | Admit: 2020-11-28 | Discharge: 2020-11-28 | Disposition: A | Discharge status: Home / Self Care | Payer: Medicaid Other | Attending: Obstetrics and Gynecology | Admitting: Obstetrics and Gynecology

## 2020-11-28 ENCOUNTER — Encounter: Payer: Self-pay | Admitting: Obstetrics and Gynecology

## 2020-11-28 ENCOUNTER — Other Ambulatory Visit: Payer: Self-pay

## 2020-11-28 DIAGNOSIS — O479 False labor, unspecified: Secondary | ICD-10-CM | POA: Diagnosis present

## 2020-11-28 DIAGNOSIS — Z3A37 37 weeks gestation of pregnancy: Secondary | ICD-10-CM | POA: Diagnosis not present

## 2020-11-28 DIAGNOSIS — O368330 Maternal care for abnormalities of the fetal heart rate or rhythm, third trimester, not applicable or unspecified: Secondary | ICD-10-CM | POA: Diagnosis not present

## 2020-11-28 DIAGNOSIS — Z8571 Personal history of Hodgkin lymphoma: Secondary | ICD-10-CM | POA: Diagnosis not present

## 2020-11-28 DIAGNOSIS — O471 False labor at or after 37 completed weeks of gestation: Secondary | ICD-10-CM | POA: Diagnosis present

## 2020-11-28 DIAGNOSIS — Z3483 Encounter for supervision of other normal pregnancy, third trimester: Secondary | ICD-10-CM

## 2020-11-28 LAB — URINALYSIS, ROUTINE W REFLEX MICROSCOPIC
Bilirubin Urine: NEGATIVE
Glucose, UA: NEGATIVE mg/dL
Hgb urine dipstick: NEGATIVE
Ketones, ur: 5 mg/dL — AB
Nitrite: NEGATIVE
Protein, ur: NEGATIVE mg/dL
Specific Gravity, Urine: 1.011 (ref 1.005–1.030)
pH: 7 (ref 5.0–8.0)

## 2020-11-28 MED ORDER — ACETAMINOPHEN 325 MG PO TABS: 650.0000 mg | ORAL_TABLET | ORAL | Status: DC | PRN

## 2020-11-28 MED ORDER — HYDROXYZINE HCL 50 MG PO TABS
50.0000 mg | ORAL_TABLET | Freq: Once | ORAL | Status: AC
  Administered 2020-11-28: 50 mg via ORAL
  Filled 2020-11-28: qty 1

## 2020-11-28 NOTE — Progress Notes (Signed)
Patient given discharge instructions and verbalized understanding. Patient does not have any questions. Patient ambulatory home in good condition with spouse.

## 2020-11-28 NOTE — Progress Notes (Signed)
Removed monitors, okay per CNM. Patient is going to walk and hydrate. CNM will recheck cervix in a couple hours to evaluate possibility of labor.

## 2020-11-28 NOTE — Discharge Summary (Signed)
Final Progress Note  Patient ID: Michele Alexander MRN: [id number] DOB/AGE: [birth year] 25 y.o.  Admit date: 11/28/2020 Admitting provider: Imagene Riches, CNM Discharge date: 11/28/2020   Admission Diagnoses: irregular contractions  Discharge Diagnoses:  Active Problems:   Irregular uterine contractions  Reactive fetal heart tracing Latent labor  History of Present Illness: The patient is a 25 y.o. female [phone] at [redacted]w[redacted]d who presents for evaluation of contractions that started last evening, and, per the patient, have been going on regularly since then.. She verbalizes several times that she is ready to have the baby and really wants to be in labor.[name] was seen on the unit two days ago for Nausea and vomiting; provided IV fluids and monitored and then sent home once she was tolerating a regular diet. She denies any vaginal bleeding, leaking of fluid, and her baby has been active.  Past Medical History:  Diagnosis Date   Anemia    Anxiety    Blood transfusion without reported diagnosis    Cancer ([place])    Hodgkin's Lymphoma   Depression    Hodgkin's disease(201)    Sickle cell trait ([place])    traits    Past Surgical History:  Procedure Laterality Date   DILATION AND EVACUATION N/A 12/21/2015   Procedure: DILATATION AND EVACUATION;  Surgeon: Will Bonnet, MD;  Location: [facility] ORS;  Service: Gynecology;  Laterality: N/A;   NECK LESION BIOPSY     OTHER SURGICAL HISTORY N/A 2015   Endoscopy   PORT-A-CATH REMOVAL     PORTACATH PLACEMENT Left 2010    No current facility-administered medications on file prior to encounter.   Current Outpatient Medications on File Prior to Encounter  Medication Sig Dispense Refill   acetaminophen (TYLENOL) 160 MG/5ML elixir Take 15 mg/kg by mouth every 4 (four) hours as needed for fever.     Butalbital-APAP-Caffeine 50-325-40 MG capsule Take 1-2 capsules by mouth every 6 (six) hours as needed for headache. 30 capsule 3   Ensure (ENSURE) Take  237 mLs by mouth 2 (two) times daily between meals. 1245 mL 12   folic acid (FOLVITE) 1 MG tablet Take 1 tablet (1 mg total) by mouth daily. 30 tablet 10   Prenatal Vit-Fe Fumarate-FA (MULTIVITAMIN-PRENATAL) 27-0.8 MG TABS tablet Take 1 tablet by mouth daily at 12 noon.     sucralfate (CARAFATE) 1 g tablet Take 1 tablet (1 g total) by mouth 4 (four) times daily -  with meals and at bedtime. 120 tablet 0   escitalopram (LEXAPRO) 20 MG tablet Take 1 tablet by mouth daily. (Patient not taking: Reported on 11/28/2020)      Allergies  Allergen Reactions   Other Hives and Other (See Comments)    Oral CT contrast Oral CT contrast   Diatrizoate Hives    PO only.  Tolerates IV contrast.   Iodinated Diagnostic Agents Hives   Nexium [Esomeprazole Magnesium] Swelling    Social History   Socioeconomic History   Marital status: Married    Spouse name: Dylan   Number of children: 2   Years of education: Not on file   Highest education level: Not on file  Occupational History   Not on file  Tobacco Use   Smoking status: Never   Smokeless tobacco: Never  Vaping Use   Vaping Use: Never used  Substance and Sexual Activity   Alcohol use: No   Drug use: No   Sexual activity: Yes    Partners: Male    Birth control/protection: Surgical  Comment: signed  Other Topics Concern   Not on file  Social History Narrative   Right handed      Lives with husband in two story home   Social Determinants of Health   Financial Resource Strain: Not on file  Food Insecurity: Not on file  Transportation Needs: Not on file  Physical Activity: Not on file  Stress: Not on file  Social Connections: Not on file  Intimate Partner Violence: Not on file    Family History  Problem Relation Age of Onset   Hypertension Mother    Diabetes Maternal Grandfather    Hypertension Maternal Grandfather    Cancer Maternal Grandfather    Cancer Maternal Grandmother    Asthma Sister    Asthma Daughter       Review of Systems  Constitutional: Negative.   HENT: Negative.    Eyes: Negative.   Respiratory: Negative.    Cardiovascular: Negative.   Gastrointestinal:        Gravid abdomen. C/o contractions since last night  Genitourinary: Negative.   Musculoskeletal: Negative.   Skin: Negative.   Neurological: Negative.   Endo/Heme/Allergies: Negative.   Psychiatric/Behavioral: Negative.      Physical Exam: BP 124/76 (BP Location: Right Arm)   Pulse 91   Temp 98.4 F (36.9 C) (Oral)   Resp 16   Ht 4\' 11"  (1.499 m)   Wt 47.2 kg   LMP 03/09/2020 (Exact Date)   BMI 21.01 kg/m   Physical Exam Constitutional:      Appearance: Normal appearance.     Comments: Low BMI  Genitourinary:     Vulva normal.  HENT:     Head: Normocephalic and atraumatic.     Nose: Nose normal.  Cardiovascular:     Rate and Rhythm: Normal rate and regular rhythm.     Pulses: Normal pulses.     Heart sounds: Normal heart sounds.  Pulmonary:     Effort: Pulmonary effort is normal.     Breath sounds: Normal breath sounds.  Abdominal:     General: Bowel sounds are normal.     Palpations: Abdomen is soft.     Comments: Gravid abdomen  Musculoskeletal:        General: Normal range of motion.     Cervical back: Normal range of motion and neck supple.  Neurological:     General: No focal deficit present.     Mental Status: She is alert and oriented to person, place, and time.  Skin:    General: Skin is warm and dry.  Psychiatric:        Mood and Affect: Mood normal.        Behavior: Behavior normal.    Consults: None  Significant Findings/ Diagnostic Studies: labs: urinalysis shows + ketones, trace leukocytes, and trace bacteria  Procedures: EFM/NST NST Baseline FHR: 125 beats/min Variability: moderate Accelerations: present Decelerations: absent Tocometry: irregular contractions q 4-7 minutes that palpate mild  Interpretation:  INDICATIONS: rule out uterine contractions RESULTS:  A NST  procedure was performed with FHR monitoring and a normal baseline established, appropriate time of 20-40 minutes of evaluation, and accels >2 seen w 15x15 characteristics.  Results show a REACTIVE NST.    Hospital Course: The patient was admitted to Labor and Delivery Triage for observation. Her contractions were monitored and the fetal heart tracing was reactive and reassuring. A urine sample was sent for analysis.A vaginal exam was performed, and she was provided po hydration and a snack. After walking  for two hours, her cervix had not changed. With a Category 1 strip and no cervical change, she is discharged home with a Vistaril for rest. She will follow up tomorrow at [facility].  She is requesting an elective induction at 39 weeks.    Discharge Condition: good  Disposition:  Discharge disposition: 01-Home or Self Care      Diet: Regular diet  Discharge Activity: Activity as tolerated  Discharge Instructions     Fetal Kick Count:  Lie on our left side for one hour after a meal, and count the number of times your baby kicks.  If it is less than 5 times, get up, move around and drink some juice.  Repeat the test 30 minutes later.  If it is still less than 5 kicks in an hour, notify your doctor.   Complete by: As directed    Fetal Kick Count:  Lie on our left side for one hour after a meal, and count the number of times your baby kicks.  If it is less than 5 times, get up, move around and drink some juice.  Repeat the test 30 minutes later.  If it is still less than 5 kicks in an hour, notify your doctor.   Complete by: As directed    LABOR:  When conractions begin, you should start to time them from the beginning of one contraction to the beginning  of the next.  When contractions are 5 - 10 minutes apart or less and have been regular for at least an hour, you should call your health care provider.   Complete by: As directed    LABOR:  When conractions begin, you should start to time  them from the beginning of one contraction to the beginning  of the next.  When contractions are 5 - 10 minutes apart or less and have been regular for at least an hour, you should call your health care provider.   Complete by: As directed    Notify physician for bleeding from the vagina   Complete by: As directed    Notify physician for bleeding from the vagina   Complete by: As directed    Notify physician for blurring of vision or spots before the eyes   Complete by: As directed    Notify physician for blurring of vision or spots before the eyes   Complete by: As directed    Notify physician for chills or fever   Complete by: As directed    Notify physician for chills or fever   Complete by: As directed    Notify physician for fainting spells, "black outs" or loss of consciousness   Complete by: As directed    Notify physician for fainting spells, "black outs" or loss of consciousness   Complete by: As directed    Notify physician for increase in vaginal discharge   Complete by: As directed    Notify physician for increase in vaginal discharge   Complete by: As directed    Notify physician for leaking of fluid   Complete by: As directed    Notify physician for leaking of fluid   Complete by: As directed    Notify physician for pain or burning when urinating   Complete by: As directed    Notify physician for pain or burning when urinating   Complete by: As directed    Notify physician for pelvic pressure (sudden increase)   Complete by: As directed    Notify physician  for pelvic pressure (sudden increase)   Complete by: As directed    Notify physician for severe or continued nausea or vomiting   Complete by: As directed    Notify physician for severe or continued nausea or vomiting   Complete by: As directed    Notify physician for sudden gushing of fluid from the vagina (with or without continued leaking)   Complete by: As directed    Notify physician for sudden gushing of  fluid from the vagina (with or without continued leaking)   Complete by: As directed    Notify physician for sudden, constant, or occasional abdominal pain   Complete by: As directed    Notify physician for sudden, constant, or occasional abdominal pain   Complete by: As directed    Notify physician if baby moving less than usual   Complete by: As directed    Notify physician if baby moving less than usual   Complete by: As directed       Allergies as of 11/28/2020       Reactions   Other Hives, Other (See Comments)   Oral CT contrast Oral CT contrast   Diatrizoate Hives   PO only.  Tolerates IV contrast.   Iodinated Diagnostic Agents Hives   Nexium [esomeprazole Magnesium] Swelling        Medication List     STOP taking these medications    escitalopram 20 MG tablet Commonly known as: LEXAPRO       TAKE these medications    acetaminophen 160 MG/5ML elixir Commonly known as: TYLENOL Take 15 mg/kg by mouth every 4 (four) hours as needed for fever.   Butalbital-APAP-Caffeine 50-325-40 MG capsule Take 1-2 capsules by mouth every 6 (six) hours as needed for headache.   Ensure Take 237 mLs by mouth 2 (two) times daily between meals.   folic acid 1 MG tablet Commonly known as: FOLVITE Take 1 tablet (1 mg total) by mouth daily.   multivitamin-prenatal 27-0.8 MG Tabs tablet Take 1 tablet by mouth daily at 12 noon.   sucralfate 1 g tablet Commonly known as: Carafate Take 1 tablet (1 g total) by mouth 4 (four) times daily -  with meals and at bedtime.         Total time spent taking care of this patient: 40 minutes  Signed: Imagene Riches, CNM  11/28/2020, 5:32 PM

## 2020-11-28 NOTE — OB Triage Note (Signed)
Patient is G8P2, [redacted]w[redacted]d that presents with complaint of contractions that started about 10p last night and have progressively gotten worse. Patient states that the contractions are about 3-5 mins apart and she does not get relief with hydrotherapy, yoga ball or walking. Patient states that she is not sure if she is leaking fluids. Pt denies bleeding. +FM. VSS. Monitors applied and assessing.

## 2020-11-28 NOTE — Discharge Summary (Signed)
Se see final Prgress Note.  Imagene Riches, CNM  11/28/2020 5:10 PM

## 2020-11-29 ENCOUNTER — Encounter: Payer: Self-pay | Admitting: Advanced Practice Midwife

## 2020-11-29 ENCOUNTER — Ambulatory Visit (INDEPENDENT_AMBULATORY_CARE_PROVIDER_SITE_OTHER): Payer: Medicaid Other | Admitting: Advanced Practice Midwife

## 2020-11-29 VITALS — BP 90/60 | Wt 105.0 lb

## 2020-11-29 DIAGNOSIS — Z3483 Encounter for supervision of other normal pregnancy, third trimester: Secondary | ICD-10-CM

## 2020-11-29 DIAGNOSIS — Z3A37 37 weeks gestation of pregnancy: Secondary | ICD-10-CM

## 2020-11-29 LAB — POCT URINALYSIS DIPSTICK OB
Glucose, UA: NEGATIVE
POC,PROTEIN,UA: NEGATIVE

## 2020-11-29 NOTE — H&P (Signed)
OB History & Physical   History of Present Illness:  Chief Complaint: elective induction of labor  HPI:  Michele Alexander is a 25 y.o. [phone] female at [redacted]w[redacted]d dated by LMP c/w 9w u/s.  Her pregnancy has been complicated by  Nausea and vomiting during pregnancy; History of Hodgkin's lymphoma; Sickle cell trait ([place]); Supervision of normal pregnancy; History of postpartum depression, currently pregnant; Anxiety; Labor and delivery, indication for care; [redacted] weeks gestation of pregnancy; and Irregular uterine contractions.  She reports contractions.   She denies leakage of fluid.   She denies vaginal bleeding.   She reports fetal movement.    Total weight gain for pregnancy: 17 lb (7.711 kg)   Obstetrical Problem List: pregnancy #8 Problems (from 04/25/20 to present)     Problem Noted Resolved   [redacted] weeks gestation of pregnancy 11/26/2020 by Rod Can, CNM No   Supervision of normal pregnancy 08/31/2018 by Octaviano Glow, RN No   Overview Addendum 11/07/2020  3:58 PM by Orlie Pollen, CNM      [place]  LAB RESULTS  Language [place] Pap 08/31/18: neg  Initiated care at [facility] GC/CT Initial:  -/-          36wks:  Dating by LMP c/w 9wk U/S    Support person [place]: normal female    [place]/HgbE   Flu vaccine 05/18/2020 CF neg  TDaP vaccine 10/05/20 SMA neg  Rhogam Not needed Fragile X neg  Covid + during pregnancy, considering vaccination    Anatomy US complete Blood Type O/Positive/-- (03/24 1603)  Feeding Plan breast Antibody Negative (03/24 1603)  Contraception  BTL - consents signed HBsAg Negative (03/24 1603)  Circumcision n/a RPR Non Reactive (03/24 1603)  Pediatrician [facility] Rubella  1.01 (03/24 1603)  Prenatal Classes declines HIV Non Reactive (03/24 1603)    GTT/A1C Early:      26-28wks:  77/90/97  BTL Consent  signed GBS        [ ]  PCN allergy  VBAC Consent n/a    Waterbirth  PP Needs                 Maternal Medical History:   Past Medical History:   Diagnosis Date   Anemia    Anxiety    Blood transfusion without reported diagnosis    Cancer ([place])    Hodgkin's Lymphoma   Depression    Hodgkin's disease(201)    Sickle cell trait ([place])    traits    Past Surgical History:  Procedure Laterality Date   DILATION AND EVACUATION N/A 12/21/2015   Procedure: DILATATION AND EVACUATION;  Surgeon: Will Bonnet, MD;  Location: [facility] ORS;  Service: Gynecology;  Laterality: N/A;   NECK LESION BIOPSY     OTHER SURGICAL HISTORY N/A 2015   Endoscopy   PORT-A-CATH REMOVAL     PORTACATH PLACEMENT Left 2010    Allergies  Allergen Reactions   Other Hives and Other (See Comments)    Oral CT contrast Oral CT contrast   Diatrizoate Hives    PO only.  Tolerates IV contrast.   Iodinated Diagnostic Agents Hives   Nexium [Esomeprazole Magnesium] Swelling    Prior to Admission medications   Medication Sig Start Date End Date Taking? Authorizing Provider  acetaminophen (TYLENOL) 160 MG/5ML elixir Take 15 mg/kg by mouth every 4 (four) hours as needed for fever.   Yes [provider]  Butalbital-APAP-Caffeine 50-325-40 MG capsule Take 1-2 capsules by mouth every 6 (six) hours  as needed for headache. 08/30/20  Yes Rod Can, CNM  Ensure (ENSURE) Take 237 mLs by mouth 2 (two) times daily between meals. 07/26/20  Yes Gae Dry, MD  folic acid (FOLVITE) 1 MG tablet Take 1 tablet (1 mg total) by mouth daily. 04/25/20  Yes Malachy Mood, MD  Prenatal Vit-Fe Fumarate-FA (MULTIVITAMIN-PRENATAL) 27-0.8 MG TABS tablet Take 1 tablet by mouth daily at 12 noon.   Yes [provider]  sucralfate (CARAFATE) 1 g tablet Take 1 tablet (1 g total) by mouth 4 (four) times daily -  with meals and at bedtime. 11/21/20  Yes Veal, Marolyn Hammock, CNM    OB History  Gravida Para Term Preterm AB Living  8 2 2  0 5 2  SAB IAB Ectopic Multiple Live Births  5 0 0 0 2    # Outcome Date GA Lbr Len/2nd Weight Sex Delivery Anes PTL Lv  8 Current            7 Term 03/25/19 [redacted]w[redacted]d 18:10 / 00:21 8 lb 4.6 oz (3.76 kg) F Vag-Spont EPI  LIV  6 SAB 02/2018          5 SAB 12/11/17          4 SAB 10/26/17          3 SAB 10/14/16 [redacted]w[redacted]d         2 SAB 12/21/15          1 Term 04/23/15 [redacted]w[redacted]d 35:30 / 00:37 7 lb 7.2 oz (3.38 kg) F Vag-Spont EPI Y LIV    Obstetric Comments  Pt reports 12 miscarriages, 2 term deliveries, and current pregnancy    Prenatal care site: [facility]  Social History: She  reports that she has never smoked. She has never used smokeless tobacco. She reports that she does not drink alcohol and does not use drugs.  Family History: family history includes Asthma in her daughter and sister; Cancer in her maternal grandfather and maternal grandmother; Diabetes in her maternal grandfather; Hypertension in her maternal grandfather and mother.    Review of Systems:  Review of Systems  Constitutional:  Negative for chills and fever.  HENT:  Negative for congestion, ear discharge, ear pain, hearing loss, sinus pain and sore throat.   Eyes:  Negative for blurred vision and double vision.  Respiratory:  Negative for cough, shortness of breath and wheezing.   Cardiovascular:  Negative for chest pain, palpitations and leg swelling.  Gastrointestinal:  Positive for abdominal pain. Negative for blood in stool, constipation, diarrhea, heartburn, melena, nausea and vomiting.  Genitourinary:  Negative for dysuria, flank pain, frequency, hematuria and urgency.  Musculoskeletal:  Negative for back pain, joint pain and myalgias.  Skin:  Negative for itching and rash.  Neurological:  Negative for dizziness, tingling, tremors, sensory change, speech change, focal weakness, seizures, loss of consciousness, weakness and headaches.  Endo/Heme/Allergies:  Negative for environmental allergies. Does not bruise/bleed easily.  Psychiatric/Behavioral:  Negative for depression, hallucinations, memory loss, substance abuse and suicidal ideas. The patient  is not nervous/anxious and does not have insomnia.     Physical Exam:  BP 90/60   Wt 105 lb (47.6 kg)   LMP 03/09/2020 (Exact Date)   BMI 21.21 kg/m   Vital Signs: BP 90/60   Wt 105 lb (47.6 kg)   LMP 03/09/2020 (Exact Date)   BMI 21.21 kg/m  Constitutional: Well nourished, well developed female in no acute distress.  HEENT: normal Skin: Warm and dry.  Cardiovascular: Regular  rate and rhythm.   Extremity:  no edema   Respiratory: Clear to auscultation bilateral. Normal respiratory effort Abdomen: FHT present Back: no CVAT Neuro: DTRs 2+, Cranial nerves grossly intact Psych: Alert and Oriented x3. No memory deficits. Normal mood and affect.  MS: normal gait, normal bilateral lower extremity ROM/strength/stability.  Pelvic exam:  is not limited by body habitus EGBUS: within normal limits Vagina: within normal limits and with normal mucosa, no blood in the vault Cervix: 4/60/-2, cervical sweep   Lab Results  Component Value Date   [place] NEGATIVE 11/26/2020    Assessment:  Michele Alexander is a 25 y.o. [id number] female at [redacted]w[redacted]d with elective IOL, favorable cervix.   Plan:  Admit to Labor & Delivery  CBC, T&S, Clrs, IVF GBS positive: Penicillin prophylaxis Fetal well-being: reassuring Begin induction based on admission exam   Rod Can, CNM 11/29/2020 4:32 PM

## 2020-11-29 NOTE — H&P (View-Only) (Signed)
OB History & Physical   History of Present Illness:  Chief Complaint: elective induction of labor  HPI:  Michele Alexander is a 25 y.o. [phone] female at [redacted]w[redacted]d dated by LMP c/w 9w u/s.  Her pregnancy has been complicated by  Nausea and vomiting during pregnancy; History of Hodgkin's lymphoma; Sickle cell trait ([place]); Supervision of normal pregnancy; History of postpartum depression, currently pregnant; Anxiety; Labor and delivery, indication for care; [redacted] weeks gestation of pregnancy; and Irregular uterine contractions.  She reports contractions.   She denies leakage of fluid.   She denies vaginal bleeding.   She reports fetal movement.    Total weight gain for pregnancy: 17 lb (7.711 kg)   Obstetrical Problem List: pregnancy #8 Problems (from 04/25/20 to present)     Problem Noted Resolved   [redacted] weeks gestation of pregnancy 11/26/2020 by Rod Can, CNM No   Supervision of normal pregnancy 08/31/2018 by Octaviano Glow, RN No   Overview Addendum 11/07/2020  3:58 PM by Orlie Pollen, CNM      [place]  LAB RESULTS  Language [place] Pap 08/31/18: neg  Initiated care at [facility] GC/CT Initial:  -/-          36wks:  Dating by LMP c/w 9wk U/S    Support person [place]: normal female    [place]/HgbE   Flu vaccine 05/18/2020 CF neg  TDaP vaccine 10/05/20 SMA neg  Rhogam Not needed Fragile X neg  Covid + during pregnancy, considering vaccination    Anatomy US complete Blood Type O/Positive/-- (03/24 1603)  Feeding Plan breast Antibody Negative (03/24 1603)  Contraception  BTL - consents signed HBsAg Negative (03/24 1603)  Circumcision n/a RPR Non Reactive (03/24 1603)  Pediatrician [facility] Rubella  1.01 (03/24 1603)  Prenatal Classes declines HIV Non Reactive (03/24 1603)    GTT/A1C Early:      26-28wks:  77/90/97  BTL Consent  signed GBS        [ ]  PCN allergy  VBAC Consent n/a    Waterbirth  PP Needs                 Maternal Medical History:   Past Medical History:   Diagnosis Date   Anemia    Anxiety    Blood transfusion without reported diagnosis    Cancer ([facility])    Hodgkin's Lymphoma   Depression    Hodgkin's disease(201)    Sickle cell trait ([place])    traits    Past Surgical History:  Procedure Laterality Date   DILATION AND EVACUATION N/A 12/21/2015   Procedure: DILATATION AND EVACUATION;  Surgeon: Will Bonnet, MD;  Location: [facility] ORS;  Service: Gynecology;  Laterality: N/A;   NECK LESION BIOPSY     OTHER SURGICAL HISTORY N/A 2015   Endoscopy   PORT-A-CATH REMOVAL     PORTACATH PLACEMENT Left 2010    Allergies  Allergen Reactions   Other Hives and Other (See Comments)    Oral CT contrast Oral CT contrast   Diatrizoate Hives    PO only.  Tolerates IV contrast.   Iodinated Diagnostic Agents Hives   Nexium [Esomeprazole Magnesium] Swelling    Prior to Admission medications   Medication Sig Start Date End Date Taking? Authorizing Provider  acetaminophen (TYLENOL) 160 MG/5ML elixir Take 15 mg/kg by mouth every 4 (four) hours as needed for fever.   Yes [provider]  Butalbital-APAP-Caffeine 50-325-40 MG capsule Take 1-2 capsules by mouth every 6 (six) hours  as needed for headache. 08/30/20  Yes Rod Can, CNM  Ensure (ENSURE) Take 237 mLs by mouth 2 (two) times daily between meals. 07/26/20  Yes Gae Dry, MD  folic acid (FOLVITE) 1 MG tablet Take 1 tablet (1 mg total) by mouth daily. 04/25/20  Yes Malachy Mood, MD  Prenatal Vit-Fe Fumarate-FA (MULTIVITAMIN-PRENATAL) 27-0.8 MG TABS tablet Take 1 tablet by mouth daily at 12 noon.   Yes [provider]  sucralfate (CARAFATE) 1 g tablet Take 1 tablet (1 g total) by mouth 4 (four) times daily -  with meals and at bedtime. 11/21/20  Yes Veal, Marolyn Hammock, CNM    OB History  Gravida Para Term Preterm AB Living  8 2 2  0 5 2  SAB IAB Ectopic Multiple Live Births  5 0 0 0 2    # Outcome Date GA Lbr Len/2nd Weight Sex Delivery Anes PTL Lv  8 Current            7 Term 03/25/19 [redacted]w[redacted]d 18:10 / 00:21 8 lb 4.6 oz (3.76 kg) F Vag-Spont EPI  LIV  6 SAB 02/2018          5 SAB 12/11/17          4 SAB 10/26/17          3 SAB 10/14/16 [redacted]w[redacted]d         2 SAB 12/21/15          1 Term 04/23/15 [redacted]w[redacted]d 35:30 / 00:37 7 lb 7.2 oz (3.38 kg) F Vag-Spont EPI Y LIV    Obstetric Comments  Pt reports 12 miscarriages, 2 term deliveries, and current pregnancy    Prenatal care site: [facility]  Social History: She  reports that she has never smoked. She has never used smokeless tobacco. She reports that she does not drink alcohol and does not use drugs.  Family History: family history includes Asthma in her daughter and sister; Cancer in her maternal grandfather and maternal grandmother; Diabetes in her maternal grandfather; Hypertension in her maternal grandfather and mother.    Review of Systems:  Review of Systems  Constitutional:  Negative for chills and fever.  HENT:  Negative for congestion, ear discharge, ear pain, hearing loss, sinus pain and sore throat.   Eyes:  Negative for blurred vision and double vision.  Respiratory:  Negative for cough, shortness of breath and wheezing.   Cardiovascular:  Negative for chest pain, palpitations and leg swelling.  Gastrointestinal:  Positive for abdominal pain. Negative for blood in stool, constipation, diarrhea, heartburn, melena, nausea and vomiting.  Genitourinary:  Negative for dysuria, flank pain, frequency, hematuria and urgency.  Musculoskeletal:  Negative for back pain, joint pain and myalgias.  Skin:  Negative for itching and rash.  Neurological:  Negative for dizziness, tingling, tremors, sensory change, speech change, focal weakness, seizures, loss of consciousness, weakness and headaches.  Endo/Heme/Allergies:  Negative for environmental allergies. Does not bruise/bleed easily.  Psychiatric/Behavioral:  Negative for depression, hallucinations, memory loss, substance abuse and suicidal ideas. The patient  is not nervous/anxious and does not have insomnia.     Physical Exam:  BP 90/60   Wt 105 lb (47.6 kg)   LMP 03/09/2020 (Exact Date)   BMI 21.21 kg/m   Vital Signs: BP 90/60   Wt 105 lb (47.6 kg)   LMP 03/09/2020 (Exact Date)   BMI 21.21 kg/m  Constitutional: Well nourished, well developed female in no acute distress.  HEENT: normal Skin: Warm and dry.  Cardiovascular: Regular  rate and rhythm.   Extremity:  no edema   Respiratory: Clear to auscultation bilateral. Normal respiratory effort Abdomen: FHT present Back: no CVAT Neuro: DTRs 2+, Cranial nerves grossly intact Psych: Alert and Oriented x3. No memory deficits. Normal mood and affect.  MS: normal gait, normal bilateral lower extremity ROM/strength/stability.  Pelvic exam:  is not limited by body habitus EGBUS: within normal limits Vagina: within normal limits and with normal mucosa, no blood in the vault Cervix: 4/60/-2, cervical sweep   Lab Results  Component Value Date   [place] NEGATIVE 11/26/2020    Assessment:  Harshika Mago is a 25 y.o. [id number] female at [redacted]w[redacted]d with elective IOL, favorable cervix.   Plan:  Admit to Labor & Delivery  CBC, T&S, Clrs, IVF GBS positive: Penicillin prophylaxis Fetal well-being: reassuring Begin induction based on admission exam   Rod Can, CNM 11/29/2020 4:32 PM

## 2020-11-29 NOTE — Addendum Note (Signed)
Addended by: Drenda Freeze on: 11/29/2020 04:42 PM   Modules accepted: Orders

## 2020-11-29 NOTE — Progress Notes (Signed)
ROB- cervix check 

## 2020-11-29 NOTE — Progress Notes (Signed)
Routine Prenatal Care Visit  Subjective  Michele Alexander is a 25 y.o. [phone] at [redacted]w[redacted]d being seen today for ongoing prenatal care.  She is currently monitored for the following issues for this low-risk pregnancy and has Nausea and vomiting during pregnancy; History of Hodgkin's lymphoma; Sickle cell trait ([place]); Supervision of normal pregnancy; History of postpartum depression, currently pregnant; Anxiety; Labor and delivery, indication for care; [redacted] weeks gestation of pregnancy; and Irregular uterine contractions on their problem list.  ----------------------------------------------------------------------------------- Patient reports irregular contractions. She desires 39 week induction. Contractions: Irregular. Vag. Bleeding: None.  Movement: Present. Leaking Fluid denies.  ----------------------------------------------------------------------------------- The following portions of the patient's history were reviewed and updated as appropriate: allergies, current medications, past family history, past medical history, past social history, past surgical history and problem list. Problem list updated.  Objective  Blood pressure 90/60, weight 105 lb (47.6 kg), last menstrual period 03/09/2020, unknown if currently breastfeeding. Pregravid weight 88 lb (39.9 kg) Total Weight Gain 17 lb (7.711 kg) Urinalysis: Urine Protein    Urine Glucose    Fetal Status: Fetal Heart Rate (bpm): 147 Fundal Height: 35 cm Movement: Present  Presentation: Vertex  General:  Alert, oriented and cooperative. Patient is in no acute distress.  Skin: Skin is warm and dry. No rash noted.   Cardiovascular: Normal heart rate noted  Respiratory: Normal respiratory effort, no problems with respiration noted  Abdomen: Soft, gravid, appropriate for gestational age. Pain/Pressure: Present     Pelvic:  Cervical exam performed Dilation: 4 Effacement (%): 60 Station: -2, cervical sweep  Extremities: Normal range of motion.   Edema: None  Mental Status: Normal mood and affect. Normal behavior. Normal judgment and thought content.   Assessment   25 y.o. [id number] at [redacted]w[redacted]d by  12/14/2020, by Last Menstrual Period presenting for routine prenatal visit  Plan   pregnancy #8 Problems (from 04/25/20 to present)     Problem Noted Resolved   [redacted] weeks gestation of pregnancy 11/26/2020 by Rod Can, CNM No   Supervision of normal pregnancy 08/31/2018 by Octaviano Glow, RN No   Overview Addendum 11/07/2020  3:58 PM by Orlie Pollen, CNM      [place]  LAB RESULTS  Language [place] Pap 08/31/18: neg  Initiated care at [facility] GC/CT Initial:  -/-          36wks:  Dating by LMP c/w 9wk U/S    Support person [place]: normal female    [place]/HgbE   Flu vaccine 05/18/2020 CF neg  TDaP vaccine 10/05/20 SMA neg  Rhogam Not needed Fragile X neg  Covid + during pregnancy, considering vaccination    Anatomy US complete Blood Type O/Positive/-- (03/24 1603)  Feeding Plan breast Antibody Negative (03/24 1603)  Contraception  BTL - consents signed HBsAg Negative (03/24 1603)  Circumcision n/a RPR Non Reactive (03/24 1603)  Pediatrician [facility] Rubella  1.01 (03/24 1603)  Prenatal Classes declines HIV Non Reactive (03/24 1603)    GTT/A1C Early:      26-28wks:  77/90/97  BTL Consent  signed GBS        [ ]  PCN allergy  VBAC Consent n/a    Waterbirth  PP Needs                  Term labor symptoms and general obstetric precautions including but not limited to vaginal bleeding, contractions, leaking of fluid and fetal movement were reviewed in detail with the patient. Please refer to After Visit Summary for other  counseling recommendations.   Return in about 1 week (around 12/06/2020) for rob.  Rod Can, CNM 11/29/2020 4:24 PM

## 2020-11-30 ENCOUNTER — Encounter: Payer: Self-pay | Admitting: Obstetrics and Gynecology

## 2020-11-30 ENCOUNTER — Observation Stay
Admission: EM | Admit: 2020-11-30 | Discharge: 2020-11-30 | Disposition: A | Discharge status: Home / Self Care | Payer: Medicaid Other | Attending: Obstetrics and Gynecology | Admitting: Obstetrics and Gynecology

## 2020-11-30 DIAGNOSIS — Z3A38 38 weeks gestation of pregnancy: Secondary | ICD-10-CM

## 2020-11-30 DIAGNOSIS — O471 False labor at or after 37 completed weeks of gestation: Secondary | ICD-10-CM | POA: Diagnosis present

## 2020-11-30 DIAGNOSIS — O479 False labor, unspecified: Secondary | ICD-10-CM

## 2020-11-30 MED ORDER — ACETAMINOPHEN 325 MG PO TABS: 650.0000 mg | ORAL_TABLET | ORAL | Status: DC | PRN

## 2020-11-30 NOTE — OB Triage Note (Signed)
Patient arrived for [facility] triage with complaints of contractions that has lasted all day and are now 3 minutes apart. Patient is a G8P2 however will report that she has had 12 miscarriages 2 term deliveries and this current pregnancy. Patient reports some bloody mucus vaginal discharge and no symptoms consistent with ROM. Patient placed on EFM and toco to non tender area of abdomen. Patient oriented to care environment and notified of visitation policy and hospital COVID policies related to visitors and mask use. Patient here to rule out labor as she was seen in office on 6/23 and was noted to be 4cm. Will notify provider of patient's arrival.

## 2020-11-30 NOTE — OB Triage Note (Signed)
Dr. Georgianne Fick at bedside discussing plan of care. PAtient notified that if no change in 2 hourse she may be discharged to home until she experiences contractions that change in frequency and intensity, LOF, or s/s of active labor.

## 2020-11-30 NOTE — Progress Notes (Signed)
Pt discharged home. Left floor ambulatory with significant other. Michele Alexander

## 2020-11-30 NOTE — Discharge Summary (Signed)
Physician Final Progress Note  Patient ID: Michele Alexander MRN: [id number] DOB/AGE: 24-15-1997 24 y.o.  Admit date: 11/30/2020 Admitting provider: Malachy Mood, MD Discharge date: 11/30/2020   Admission Diagnoses: Contractions  Discharge Diagnoses:  Contractions  25 y.o. [name] at [redacted]w[redacted]d presenting with contractions.  The patient was monitored over the course of 6-hrs and 3 cervical checks with no change in cervix noted during that time and cervical exam also unchanged from prior exams earlier this week at 4cm.  No LOF, no VB, +FM.    Prengancy notable for a history of Hodgkin's Lymphoma, anemia, and depression.  pregnancy #8 Problems (from 04/25/20 to present)     Problem Noted Resolved   [redacted] weeks gestation of pregnancy 11/26/2020 by Rod Can, CNM No   Supervision of normal pregnancy 08/31/2018 by Octaviano Glow, RN No   Overview Addendum 11/07/2020  3:58 PM by Orlie Pollen, CNM      [place]  LAB RESULTS  Language English Pap 08/31/18: neg  Initiated care at [facility] GC/CT Initial:  -/-          36wks:  Dating by LMP c/w 9wk U/S    Support person Michele Alexander: normal female    [place]/HgbE   Flu vaccine 05/18/2020 CF neg  TDaP vaccine 10/05/20 SMA neg  Rhogam Not needed Fragile X neg  Covid + during pregnancy, considering vaccination    Anatomy US complete Blood Type O/Positive/-- (03/24 1603)  Feeding Plan breast Antibody Negative (03/24 1603)  Contraception  BTL - consents signed HBsAg Negative (03/24 1603)  Circumcision n/a RPR Non Reactive (03/24 1603)  Pediatrician [facility] Rubella  1.01 (03/24 1603)  Prenatal Classes declines HIV Non Reactive (03/24 1603)    GTT/A1C Early:      26-28wks:  77/90/97  BTL Consent  signed GBS        [ ]  PCN allergy  VBAC Consent n/a    Waterbirth  PP Needs               Blood pressure 116/78, pulse 96, temperature 98.4 F (36.9 C), temperature source Oral, resp. rate 16, last menstrual period 03/09/2020, unknown if  currently breastfeeding.  Consults: None  Significant Findings/ Diagnostic Studies: none  Procedures: Baseline: 125 Variability: moderate Accelerations: present Decelerations: absent Tocometry: regular, every 5 minutes The patient was monitored for 30 minutes, fetal heart rate tracing was deemed reactive, category I tracing,  CPT [id number]   Discharge Condition: good  Disposition: Discharge disposition: 01-Home or Self Care      Diet: Regular diet  Discharge Activity: Activity as tolerated  Discharge Instructions     Discharge activity:  No Restrictions   Complete by: As directed    Discharge diet:  No restrictions   Complete by: As directed    Fetal Kick Count:  Lie on our left side for one hour after a meal, and count the number of times your baby kicks.  If it is less than 5 times, get up, move around and drink some juice.  Repeat the test 30 minutes later.  If it is still less than 5 kicks in an hour, notify your doctor.   Complete by: As directed    LABOR:  When conractions begin, you should start to time them from the beginning of one contraction to the beginning  of the next.  When contractions are 5 - 10 minutes apart or less and have been regular for at least an hour, you should call your health care  provider.   Complete by: As directed    No sexual activity restrictions   Complete by: As directed    Notify physician for bleeding from the vagina   Complete by: As directed    Notify physician for blurring of vision or spots before the eyes   Complete by: As directed    Notify physician for chills or fever   Complete by: As directed    Notify physician for fainting spells, "black outs" or loss of consciousness   Complete by: As directed    Notify physician for increase in vaginal discharge   Complete by: As directed    Notify physician for leaking of fluid   Complete by: As directed    Notify physician for pain or burning when urinating   Complete by: As  directed    Notify physician for pelvic pressure (sudden increase)   Complete by: As directed    Notify physician for severe or continued nausea or vomiting   Complete by: As directed    Notify physician for sudden gushing of fluid from the vagina (with or without continued leaking)   Complete by: As directed    Notify physician for sudden, constant, or occasional abdominal pain   Complete by: As directed    Notify physician if baby moving less than usual   Complete by: As directed       Allergies as of 11/30/2020       Reactions   Other Hives, Other (See Comments)   Oral CT contrast Oral CT contrast   Diatrizoate Hives   PO only.  Tolerates IV contrast.   Iodinated Diagnostic Agents Hives   Nexium [esomeprazole Magnesium] Swelling        Medication List     TAKE these medications    acetaminophen 160 MG/5ML elixir Commonly known as: TYLENOL Take 15 mg/kg by mouth every 4 (four) hours as needed for fever.   Butalbital-APAP-Caffeine 50-325-40 MG capsule Take 1-2 capsules by mouth every 6 (six) hours as needed for headache.   Ensure Take 237 mLs by mouth 2 (two) times daily between meals.   folic acid 1 MG tablet Commonly known as: FOLVITE Take 1 tablet (1 mg total) by mouth daily.   multivitamin-prenatal 27-0.8 MG Tabs tablet Take 1 tablet by mouth daily at 12 noon.   sucralfate 1 g tablet Commonly known as: Carafate Take 1 tablet (1 g total) by mouth 4 (four) times daily -  with meals and at bedtime.         Total time spent taking care of this patient: 30 minutes  Signed: Malachy Mood 11/30/2020, 7:58 AM

## 2020-12-02 ENCOUNTER — Other Ambulatory Visit: Payer: Self-pay

## 2020-12-02 ENCOUNTER — Observation Stay
Admission: EM | Admit: 2020-12-02 | Discharge: 2020-12-02 | Disposition: A | Discharge status: Home / Self Care | Payer: Medicaid Other | Attending: Obstetrics and Gynecology | Admitting: Obstetrics and Gynecology

## 2020-12-02 DIAGNOSIS — Z3A37 37 weeks gestation of pregnancy: Secondary | ICD-10-CM

## 2020-12-02 DIAGNOSIS — Z8571 Personal history of Hodgkin lymphoma: Secondary | ICD-10-CM | POA: Insufficient documentation

## 2020-12-02 DIAGNOSIS — Z3A38 38 weeks gestation of pregnancy: Secondary | ICD-10-CM | POA: Diagnosis not present

## 2020-12-02 DIAGNOSIS — R198 Other specified symptoms and signs involving the digestive system and abdomen: Secondary | ICD-10-CM | POA: Diagnosis present

## 2020-12-02 DIAGNOSIS — Z3483 Encounter for supervision of other normal pregnancy, third trimester: Secondary | ICD-10-CM

## 2020-12-02 DIAGNOSIS — O471 False labor at or after 37 completed weeks of gestation: Secondary | ICD-10-CM | POA: Diagnosis not present

## 2020-12-02 NOTE — OB Triage Note (Signed)
Pt arrives G3 P2 with c/octx's x 2 minutes apart all day. Pt reports that she is GBS + and wanted to make sure that she made it here on time.

## 2020-12-02 NOTE — Progress Notes (Signed)
Discharge home. Discharge instructions given. Left floor ambulatory with significant other. Dineen Kid

## 2020-12-02 NOTE — Discharge Summary (Signed)
Physician Final Progress Note  Patient ID: Michele Alexander MRN: [id number] DOB/AGE: 1995/09/11 25 y.o.  Admit date: 12/02/2020 Admitting provider: Homero Fellers, MD Discharge date: 12/02/2020   Admission Diagnoses: contractions  Discharge Diagnoses:  Active Problems:   Labor and delivery, indication for care   Abdominal complaints   [redacted] weeks gestation of pregnancy    History of Present Illness: The patient is a 25 y.o. female [phone] at [redacted]w[redacted]d who presents for regular contractions throughout the day/night. She reports fetal movement. She denies vaginal bleeding or leakage of fluid. The contractions  range from 2-5 minutes apart.  She was admitted and placed on monitors. Cervical check initially was same as check 2 days earlier. She walked for several hours and was rechecked several times. No cervical change in that time although contractions were regular every 2 minutes for a period of time- mild to moderate strength. She has been anxious about this delivery and has had a lot of contractions without cervical change. She is discharged to home with instructions and precautions and encouraged to hydrate and use the tub as a way to gauge if true labor.  Past Medical History:  Diagnosis Date   Anemia    Anxiety    Blood transfusion without reported diagnosis    Cancer ([place])    Hodgkin's Lymphoma   Depression    Hodgkin's disease(201)    Sickle cell trait ([place])    traits    Past Surgical History:  Procedure Laterality Date   DILATION AND EVACUATION N/A 12/21/2015   Procedure: DILATATION AND EVACUATION;  Surgeon: Will Bonnet, MD;  Location: [facility] ORS;  Service: Gynecology;  Laterality: N/A;   NECK LESION BIOPSY     OTHER SURGICAL HISTORY N/A 2015   Endoscopy   PORT-A-CATH REMOVAL     PORTACATH PLACEMENT Left 2010    Current Facility-Administered Medications on File Prior to Encounter  Medication Dose Route Frequency Provider Last Rate Last Admin    hydrocortisone-pramoxine (PROCTOFOAM-HC) rectal foam 1 applicator  1 applicator Rectal BID Imagene Riches, CNM       Current Outpatient Medications on File Prior to Encounter  Medication Sig Dispense Refill   acetaminophen (TYLENOL) 160 MG/5ML elixir Take 15 mg/kg by mouth every 4 (four) hours as needed for fever.     Butalbital-APAP-Caffeine 50-325-40 MG capsule Take 1-2 capsules by mouth every 6 (six) hours as needed for headache. 30 capsule 3   Ensure (ENSURE) Take 237 mLs by mouth 2 (two) times daily between meals. 9381 mL 12   folic acid (FOLVITE) 1 MG tablet Take 1 tablet (1 mg total) by mouth daily. 30 tablet 10   Prenatal Vit-Fe Fumarate-FA (MULTIVITAMIN-PRENATAL) 27-0.8 MG TABS tablet Take 1 tablet by mouth daily at 12 noon.     sucralfate (CARAFATE) 1 g tablet Take 1 tablet (1 g total) by mouth 4 (four) times daily -  with meals and at bedtime. 120 tablet 0    Allergies  Allergen Reactions   Other Hives and Other (See Comments)    Oral CT contrast Oral CT contrast   Diatrizoate Hives    PO only.  Tolerates IV contrast.   Iodinated Diagnostic Agents Hives   Nexium [Esomeprazole Magnesium] Swelling    Social History   Socioeconomic History   Marital status: Married    Spouse name: Dylan   Number of children: 2   Years of education: Not on file   Highest education level: Not on file  Occupational History  Not on file  Tobacco Use   Smoking status: Never   Smokeless tobacco: Never  Vaping Use   Vaping Use: Never used  Substance and Sexual Activity   Alcohol use: No   Drug use: No   Sexual activity: Yes    Partners: Male    Birth control/protection: Surgical    Comment: signed  Other Topics Concern   Not on file  Social History Narrative   Right handed      Lives with husband in two story home   Social Determinants of Health   Financial Resource Strain: Not on file  Food Insecurity: Not on file  Transportation Needs: Not on file  Physical Activity:  Not on file  Stress: Not on file  Social Connections: Not on file  Intimate Partner Violence: Not on file    Family History  Problem Relation Age of Onset   Hypertension Mother    Diabetes Maternal Grandfather    Hypertension Maternal Grandfather    Cancer Maternal Grandfather    Cancer Maternal Grandmother    Asthma Sister    Asthma Daughter      Review of Systems  Constitutional:  Negative for chills and fever.  HENT:  Negative for congestion, ear discharge, ear pain, hearing loss, sinus pain and sore throat.   Eyes:  Negative for blurred vision and double vision.  Respiratory:  Negative for cough, shortness of breath and wheezing.   Cardiovascular:  Negative for chest pain, palpitations and leg swelling.  Gastrointestinal:  Positive for abdominal pain. Negative for blood in stool, constipation, diarrhea, heartburn, melena, nausea and vomiting.  Genitourinary:  Negative for dysuria, flank pain, frequency, hematuria and urgency.  Musculoskeletal:  Negative for back pain, joint pain and myalgias.  Skin:  Negative for itching and rash.  Neurological:  Negative for dizziness, tingling, tremors, sensory change, speech change, focal weakness, seizures, loss of consciousness, weakness and headaches.  Endo/Heme/Allergies:  Negative for environmental allergies. Does not bruise/bleed easily.  Psychiatric/Behavioral:  Negative for depression, hallucinations, memory loss, substance abuse and suicidal ideas. The patient is not nervous/anxious and does not have insomnia.     Physical Exam: BP 133/85 (BP Location: Right Arm)   Pulse 93   Temp 98.3 F (36.8 C) (Oral)   Resp 16   Ht 4\' 11"  (1.499 m)   Wt 47.6 kg   LMP 03/09/2020 (Exact Date)   BMI 21.21 kg/m   Constitutional: Well nourished, well developed female in no acute distress.  HEENT: normal Skin: Warm and dry.  Cardiovascular: Regular rate and rhythm.   Extremity:  no edema   Respiratory: Clear to auscultation bilateral.  Normal respiratory effort Abdomen: FHT present Back: no CVAT Neuro: DTRs 2+, Cranial nerves grossly intact Psych: Alert and Oriented x3. No memory deficits. Normal mood and affect.  MS: normal gait, normal bilateral lower extremity ROM/strength/stability.  Pelvic exam: per RNs R. Jeani Hawking and L. Elks 4/60-70/-2  Toco: regular every 2-3 minutes at times and spacing out to 5-6 minutes at time of discharge Fetal well being: 160 bpm, moderate variability, +accelerations, -decelerations  Consults: None  Significant Findings/ Diagnostic Studies: none  Procedures: NST  Hospital Course: The patient was admitted to Labor and Delivery Triage for observation.   Discharge Condition: good  Disposition: Discharge disposition: 01-Home or Self Care  Diet: Regular diet  Discharge Activity: Activity as tolerated  Discharge Instructions     Discharge activity:  No Restrictions   Complete by: As directed    Discharge diet:  No restrictions   Complete by: As directed    LABOR:  When conractions begin, you should start to time them from the beginning of one contraction to the beginning  of the next.  When contractions are 5 - 10 minutes apart or less and have been regular for at least an hour, you should call your health care provider.   Complete by: As directed    No sexual activity restrictions   Complete by: As directed    Notify physician for bleeding from the vagina   Complete by: As directed    Notify physician for blurring of vision or spots before the eyes   Complete by: As directed    Notify physician for chills or fever   Complete by: As directed    Notify physician for fainting spells, "black outs" or loss of consciousness   Complete by: As directed    Notify physician for increase in vaginal discharge   Complete by: As directed    Notify physician for leaking of fluid   Complete by: As directed    Notify physician for pain or burning when urinating   Complete by: As directed     Notify physician for pelvic pressure (sudden increase)   Complete by: As directed    Notify physician for severe or continued nausea or vomiting   Complete by: As directed    Notify physician for sudden gushing of fluid from the vagina (with or without continued leaking)   Complete by: As directed    Notify physician for sudden, constant, or occasional abdominal pain   Complete by: As directed    Notify physician if baby moving less than usual   Complete by: As directed       Allergies as of 12/02/2020       Reactions   Other Hives, Other (See Comments)   Oral CT contrast Oral CT contrast   Diatrizoate Hives   PO only.  Tolerates IV contrast.   Iodinated Diagnostic Agents Hives   Nexium [esomeprazole Magnesium] Swelling        Medication List     TAKE these medications    acetaminophen 160 MG/5ML elixir Commonly known as: TYLENOL Take 15 mg/kg by mouth every 4 (four) hours as needed for fever.   Butalbital-APAP-Caffeine 50-325-40 MG capsule Take 1-2 capsules by mouth every 6 (six) hours as needed for headache.   Ensure Take 237 mLs by mouth 2 (two) times daily between meals.   folic acid 1 MG tablet Commonly known as: FOLVITE Take 1 tablet (1 mg total) by mouth daily.   multivitamin-prenatal 27-0.8 MG Tabs tablet Take 1 tablet by mouth daily at 12 noon.   sucralfate 1 g tablet Commonly known as: Carafate Take 1 tablet (1 g total) by mouth 4 (four) times daily -  with meals and at bedtime.         Total time spent taking care of this patient: 20 minutes  Signed: Rod Can, CNM  12/02/2020, 11:20 AM

## 2020-12-04 ENCOUNTER — Other Ambulatory Visit: Payer: Self-pay | Admitting: Advanced Practice Midwife

## 2020-12-04 DIAGNOSIS — R12 Heartburn: Secondary | ICD-10-CM

## 2020-12-04 MED ORDER — FAMOTIDINE 20 MG PO TABS: 20.0000 mg | ORAL_TABLET | Freq: Two times a day (BID) | ORAL | 1 refills | Status: DC

## 2020-12-04 NOTE — Telephone Encounter (Signed)
I did not see her, Michele Alexander saw her

## 2020-12-04 NOTE — Progress Notes (Signed)
Rx pepcid for heartburn. Message to patient.

## 2020-12-05 ENCOUNTER — Encounter: Payer: Self-pay | Admitting: Advanced Practice Midwife

## 2020-12-05 ENCOUNTER — Inpatient Hospital Stay
Admission: EM | Admit: 2020-12-05 | Discharge: 2020-12-06 | DRG: 797 | Disposition: A | Discharge status: Home / Self Care | Payer: Medicaid Other | Attending: Obstetrics and Gynecology | Admitting: Obstetrics and Gynecology

## 2020-12-05 ENCOUNTER — Other Ambulatory Visit: Payer: Self-pay

## 2020-12-05 DIAGNOSIS — O9081 Anemia of the puerperium: Secondary | ICD-10-CM | POA: Diagnosis not present

## 2020-12-05 DIAGNOSIS — Z3A27 27 weeks gestation of pregnancy: Secondary | ICD-10-CM

## 2020-12-05 DIAGNOSIS — D573 Sickle-cell trait: Secondary | ICD-10-CM | POA: Diagnosis present

## 2020-12-05 DIAGNOSIS — Z3A37 37 weeks gestation of pregnancy: Secondary | ICD-10-CM

## 2020-12-05 DIAGNOSIS — Z302 Encounter for sterilization: Secondary | ICD-10-CM | POA: Diagnosis not present

## 2020-12-05 DIAGNOSIS — D62 Acute posthemorrhagic anemia: Secondary | ICD-10-CM | POA: Diagnosis not present

## 2020-12-05 DIAGNOSIS — Z3490 Encounter for supervision of normal pregnancy, unspecified, unspecified trimester: Secondary | ICD-10-CM | POA: Diagnosis present

## 2020-12-05 DIAGNOSIS — O26893 Other specified pregnancy related conditions, third trimester: Secondary | ICD-10-CM | POA: Diagnosis present

## 2020-12-05 DIAGNOSIS — O2242 Hemorrhoids in pregnancy, second trimester: Secondary | ICD-10-CM

## 2020-12-05 DIAGNOSIS — O99824 Streptococcus B carrier state complicating childbirth: Secondary | ICD-10-CM | POA: Diagnosis present

## 2020-12-05 DIAGNOSIS — Z880 Allergy status to penicillin: Secondary | ICD-10-CM | POA: Diagnosis not present

## 2020-12-05 DIAGNOSIS — Z20822 Contact with and (suspected) exposure to covid-19: Secondary | ICD-10-CM | POA: Diagnosis present

## 2020-12-05 DIAGNOSIS — Z8571 Personal history of Hodgkin lymphoma: Secondary | ICD-10-CM | POA: Diagnosis not present

## 2020-12-05 LAB — RESP PANEL BY RT-PCR (FLU A&B, COVID) ARPGX2
Influenza A by PCR: NEGATIVE
Influenza B by PCR: NEGATIVE
SARS Coronavirus 2 by RT PCR: NEGATIVE

## 2020-12-05 LAB — CBC
HCT: 30.8 % — ABNORMAL LOW (ref 36.0–46.0)
Hemoglobin: 9.4 g/dL — ABNORMAL LOW (ref 12.0–15.0)
MCH: 20.1 pg — ABNORMAL LOW (ref 26.0–34.0)
MCHC: 30.5 g/dL (ref 30.0–36.0)
MCV: 65.8 fL — ABNORMAL LOW (ref 80.0–100.0)
Platelets: 326 10*3/uL (ref 150–400)
RBC: 4.68 MIL/uL (ref 3.87–5.11)
RDW: 18.6 % — ABNORMAL HIGH (ref 11.5–15.5)
WBC: 12.8 10*3/uL — ABNORMAL HIGH (ref 4.0–10.5)
nRBC: 0.2 % (ref 0.0–0.2)

## 2020-12-05 LAB — TYPE AND SCREEN
ABO/RH(D): O POS
Antibody Screen: NEGATIVE

## 2020-12-05 LAB — RPR: RPR Ser Ql: NONREACTIVE

## 2020-12-05 MED ORDER — DIPHENHYDRAMINE HCL 25 MG PO CAPS: 25.0000 mg | ORAL_CAPSULE | Freq: Four times a day (QID) | ORAL | Status: DC | PRN

## 2020-12-05 MED ORDER — BENZOCAINE-MENTHOL 20-0.5 % EX AERO
INHALATION_SPRAY | CUTANEOUS | Status: AC
  Administered 2020-12-05: 1 via TOPICAL
  Filled 2020-12-05: qty 56

## 2020-12-05 MED ORDER — SODIUM CHLORIDE 0.9 % IV SOLN: 2.0000 g | Freq: Four times a day (QID) | INTRAVENOUS | Status: DC

## 2020-12-05 MED ORDER — LACTATED RINGERS IV SOLN: INTRAVENOUS | Status: DC

## 2020-12-05 MED ORDER — ONDANSETRON HCL 4 MG PO TABS
4.0000 mg | ORAL_TABLET | ORAL | Status: DC | PRN
  Filled 2020-12-05: qty 1

## 2020-12-05 MED ORDER — ACETAMINOPHEN 325 MG PO TABS: 650.0000 mg | ORAL_TABLET | ORAL | Status: DC | PRN

## 2020-12-05 MED ORDER — OXYTOCIN-SODIUM CHLORIDE 30-0.9 UT/500ML-% IV SOLN
1.0000 m[IU]/min | INTRAVENOUS | Status: DC
Start: 2020-12-05 — End: 2020-12-05

## 2020-12-05 MED ORDER — ONDANSETRON HCL 4 MG/2ML IJ SOLN
4.0000 mg | INTRAMUSCULAR | Status: DC | PRN
  Administered 2020-12-06: 4 mg via INTRAVENOUS

## 2020-12-05 MED ORDER — SODIUM CHLORIDE 0.9 % IV SOLN: 5.0000 10*6.[IU] | Freq: Once | INTRAVENOUS | Status: DC

## 2020-12-05 MED ORDER — BENZOCAINE-MENTHOL 20-0.5 % EX AERO: 1.0000 "application " | INHALATION_SPRAY | CUTANEOUS | Status: DC | PRN

## 2020-12-05 MED ORDER — SIMETHICONE 80 MG PO CHEW: 80.0000 mg | CHEWABLE_TABLET | ORAL | Status: DC | PRN

## 2020-12-05 MED ORDER — BUTORPHANOL TARTRATE 1 MG/ML IJ SOLN
INTRAMUSCULAR | Status: AC
  Filled 2020-12-05: qty 1

## 2020-12-05 MED ORDER — ACETAMINOPHEN 325 MG PO TABS
650.0000 mg | ORAL_TABLET | ORAL | Status: DC | PRN
  Administered 2020-12-05 – 2020-12-06 (×2): 650 mg via ORAL
  Filled 2020-12-05 (×2): qty 2

## 2020-12-05 MED ORDER — DOCUSATE SODIUM 100 MG PO CAPS: 100.0000 mg | ORAL_CAPSULE | Freq: Two times a day (BID) | ORAL | Status: DC

## 2020-12-05 MED ORDER — CALCIUM CARBONATE ANTACID 500 MG PO CHEW
2.0000 | CHEWABLE_TABLET | Freq: Three times a day (TID) | ORAL | Status: DC | PRN
  Administered 2020-12-05: 400 mg via ORAL
  Filled 2020-12-05: qty 2

## 2020-12-05 MED ORDER — PENICILLIN G POT IN DEXTROSE 60000 UNIT/ML IV SOLN: 3.0000 10*6.[IU] | INTRAVENOUS | Status: DC

## 2020-12-05 MED ORDER — SODIUM CHLORIDE 0.9 % IV SOLN
INTRAVENOUS | Status: AC
  Filled 2020-12-05: qty 5

## 2020-12-05 MED ORDER — LACTATED RINGERS IV SOLN: 500.0000 mL | INTRAVENOUS | Status: DC | PRN

## 2020-12-05 MED ORDER — SODIUM CHLORIDE 0.9 % IV SOLN
INTRAVENOUS | Status: AC
  Filled 2020-12-05: qty 2000

## 2020-12-05 MED ORDER — IBUPROFEN 600 MG PO TABS
600.0000 mg | ORAL_TABLET | Freq: Four times a day (QID) | ORAL | Status: DC
  Administered 2020-12-05 – 2020-12-06 (×5): 600 mg via ORAL
  Filled 2020-12-05 (×5): qty 1

## 2020-12-05 MED ORDER — BUTORPHANOL TARTRATE 1 MG/ML IJ SOLN
1.0000 mg | INTRAMUSCULAR | Status: DC | PRN
  Administered 2020-12-05: 0.5 mg via INTRAVENOUS

## 2020-12-05 MED ORDER — PRAMOXINE-HC 1-1 % EX FOAM
1.0000 | Freq: Two times a day (BID) | CUTANEOUS | Status: DC
  Administered 2020-12-05: 1 via TOPICAL
  Filled 2020-12-05: qty 10

## 2020-12-05 MED ORDER — OXYTOCIN-SODIUM CHLORIDE 30-0.9 UT/500ML-% IV SOLN
2.5000 [IU]/h | INTRAVENOUS | Status: DC
  Administered 2020-12-05: 2.5 [IU]/h via INTRAVENOUS

## 2020-12-05 MED ORDER — DIBUCAINE (PERIANAL) 1 % EX OINT
1.0000 "application " | TOPICAL_OINTMENT | CUTANEOUS | Status: DC | PRN
  Administered 2020-12-05: 1 via RECTAL
  Filled 2020-12-05: qty 28

## 2020-12-05 MED ORDER — TERBUTALINE SULFATE 1 MG/ML IJ SOLN: 0.2500 mg | Freq: Once | INTRAMUSCULAR | Status: DC | PRN

## 2020-12-05 MED ORDER — LIDOCAINE HCL (PF) 1 % IJ SOLN: 30.0000 mL | INTRAMUSCULAR | Status: DC | PRN

## 2020-12-05 MED ORDER — ONDANSETRON HCL 4 MG/2ML IJ SOLN
4.0000 mg | Freq: Four times a day (QID) | INTRAMUSCULAR | Status: DC | PRN
  Filled 2020-12-05: qty 2

## 2020-12-05 MED ORDER — OXYTOCIN BOLUS FROM INFUSION
333.0000 mL | Freq: Once | INTRAVENOUS | Status: AC
  Administered 2020-12-05: 333 mL via INTRAVENOUS

## 2020-12-05 MED ORDER — PRENATAL MULTIVITAMIN CH
1.0000 | ORAL_TABLET | Freq: Every day | ORAL | Status: DC
  Administered 2020-12-05: 1 via ORAL
  Filled 2020-12-05: qty 1

## 2020-12-05 MED ORDER — WITCH HAZEL-GLYCERIN EX PADS
MEDICATED_PAD | CUTANEOUS | Status: AC
  Administered 2020-12-05: 1 via TOPICAL
  Filled 2020-12-05: qty 100

## 2020-12-05 MED ORDER — TETANUS-DIPHTH-ACELL PERTUSSIS 5-2.5-18.5 LF-MCG/0.5 IM SUSY
0.5000 mL | PREFILLED_SYRINGE | Freq: Once | INTRAMUSCULAR | Status: DC
  Filled 2020-12-05: qty 0.5

## 2020-12-05 MED ORDER — ZOLPIDEM TARTRATE 5 MG PO TABS: 5.0000 mg | ORAL_TABLET | Freq: Every evening | ORAL | Status: DC | PRN

## 2020-12-05 MED ORDER — FENTANYL 2.5 MCG/ML W/ROPIVACAINE 0.15% IN NS 100 ML EPIDURAL (ARMC)
EPIDURAL | Status: AC
  Filled 2020-12-05: qty 100

## 2020-12-05 MED ORDER — COCONUT OIL OIL
1.0000 "application " | TOPICAL_OIL | Status: DC | PRN
  Filled 2020-12-05: qty 120

## 2020-12-05 MED ORDER — OXYCODONE HCL 5 MG PO TABS
5.0000 mg | ORAL_TABLET | ORAL | Status: DC | PRN
  Administered 2020-12-06: 5 mg via ORAL

## 2020-12-05 MED ORDER — WITCH HAZEL-GLYCERIN EX PADS: 1.0000 "application " | MEDICATED_PAD | CUTANEOUS | Status: DC | PRN

## 2020-12-05 NOTE — Progress Notes (Signed)
[name] 25 y.o. presents to the ED reporting ctx. Pt is a [id number] at [redacted]w[redacted]d . Pt denies signs and symptons consistent with rupture of membranes or active vaginal bleeding. Pt reports contractions since 0600 that have gotten progressivly worse. Pt reports positive fetal movement. External FM and TOCO applied to non-tender abdomen and assessing. Initial FHR  160. M.Rolly Salter, CNM at bedside to assess pt.

## 2020-12-05 NOTE — Lactation Note (Signed)
This note was copied from a baby's chart. Lactation Consultation Note  Patient Name: Michele Alexander [id number] Date: 12/05/2020 Reason for consult: L&D Initial assessment;Initial assessment;Early term 37-38.6wks Age:24 hours  Initial lactation assessment in L&D. Mom is G8P3, SVD <1hr ago. Mom BF her second for 8 months, citing low milk supply as main reason for discontinuing.  Baby active at breast, tummy turned in, cradle hold, flanged lips with wide mouth. Rhythmic suck pattern displayed, a few swallows noted. Mom voices her desire to have formula available d/t hx of low supply. [place] reassured her that her feeding preferences would be honored, but provided education on milk supply and demand, normal course of lactation, and tips for ensuring a plentiful milk supply. Education also given on impact early introduction may have on her journey and the BF support available. Mom's desires relayed to Transition RN, along with feeding observation.  Reviewed newborn stomach size, feeding patterns and behaviors, early feeding cues, skin to skin, and output expectations.  Lactation to follow-up on MBU.  Maternal Data Has patient been taught Hand Expression?: Yes Does the patient have breastfeeding experience prior to this delivery?: Yes How long did the patient breastfeed?: 8 months  Feeding Mother's Current Feeding Choice: Breast Milk and Formula  LATCH Score Latch: Grasps breast easily, tongue down, lips flanged, rhythmical sucking.  Audible Swallowing: A few with stimulation  Type of Nipple: Everted at rest and after stimulation  Comfort (Breast/Nipple): Soft / non-tender  Hold (Positioning): No assistance needed to correctly position infant at breast.  LATCH Score: 9   Lactation Tools Discussed/Used    Interventions Interventions: Breast feeding basics reviewed;Support pillows;Education;Hand express  Discharge    Consult Status Consult Status: Follow-up Date:  12/05/20 Follow-up type: [place] 12/05/2020, 12:12 PM

## 2020-12-05 NOTE — Discharge Summary (Signed)
Postpartum Discharge Summary  Date of Service updated 12/05/2020     Patient Name: Michele Alexander DOB: [birth year] MRN: [id number]  Date of admission: 12/05/2020 Delivery date:12/05/2020  Delivering provider: Imagene Riches  Date of discharge: 12/12/2020  Admitting diagnosis: active labor at term Intrauterine pregnancy: [redacted]w[redacted]d    Secondary diagnosis:  Active Problems:   Normal labor and delivery   Encounter for elective induction of labor   Vaginal delivery   Postpartum care following vaginal delivery   Encounter for sterilization  Additional problems: none    Discharge diagnosis: Term Pregnancy Delivered and S/P Bilateral Tubal Ligation                                               Post partum procedures:postpartum tubal ligation Augmentation: N/A Complications: None  Hospital course: Onset of Labor With Vaginal Delivery      25y.o. yo [id number] 322w5das admitted in Active Labor on 12/05/2020. Patient had an uncomplicated labor course as follows:  Membrane Rupture Time/Date: 10:44 AM ,12/05/2020   Delivery Method:Vaginal, Spontaneous  Episiotomy: None  Lacerations:    Patient had an uncomplicated postpartum course.  She is ambulating, tolerating a regular diet, passing flatus, and urinating well. Patient is discharged home in stable condition on 12/12/20.  Newborn Data: Birth date:12/05/2020  Birth time:11:31 AM  Gender:Female  Living status:Living  Apgars:8 ,9  Weight:3350 g   Magnesium Sulfate received: No BMZ received: Yes Rhophylac:No MMR:No T-DaP:Given prenatally Flu: No Transfusion:No  Physical exam  Vitals:   12/06/20 1900 12/06/20 1925 12/06/20 1950 12/06/20 2044  BP: 115/78 121/88 139/88 133/89  Pulse: 79 68 69 70  Resp: _0 Temp: 97.7 F (36.5 C) (!) 97.5 F (36.4 C) 97.6 F (36.4 C) 97.8 F (36.6 C)  TempSrc:  Oral Oral   SpO2: 100% 100% 100% 100%  Weight:      Height:       General: alert, cooperative, and no  distress Lochia: appropriate Uterine Fundus: firm Incision: Healing well with no significant drainage DVT Evaluation: No evidence of DVT seen on physical exam. Labs: Lab Results  Component Value Date   WBC 11.5 (H) 12/06/2020   HGB 9.3 (L) 12/06/2020   HCT 31.2 (L) 12/06/2020   MCV 68.9 (L) 12/06/2020   PLT 324 12/06/2020   CMP Latest Ref Rng & Units 11/26/2020  Glucose 70 - 99 mg/dL 69(L)  BUN 6 - 20 mg/dL 6  Creatinine 0.44 - 1.00 mg/dL 0.48  Sodium 135 - 145 mmol/L 136  Potassium 3.5 - 5.1 mmol/L 3.0(L)  Chloride 98 - 111 mmol/L 104  CO2 22 - 32 mmol/L 23  Calcium 8.9 - 10.3 mg/dL 8.3(L)  Total Protein 6.5 - 8.1 g/dL 6.3(L)  Total Bilirubin 0.3 - 1.2 mg/dL 0.8  Alkaline Phos 38 - 126 U/L 218(H)  AST 15 - 41 U/L 22  ALT 0 - 44 U/L 14   Edinburgh Score: Edinburgh Postnatal Depression Scale Screening Tool 12/05/2020  I have been able to laugh and see the funny side of things. 1  I have looked forward with enjoyment to things. 1  I have blamed myself unnecessarily when things went wrong. 3  I have been anxious or worried for no good reason. 3  I have felt scared or panicky for no good reason. 2  Things have been getting on top of me. 2  I have been so unhappy that I have had difficulty sleeping. 2  I have felt sad or miserable. 1  I have been so unhappy that I have been crying. 1  The thought of harming myself has occurred to me. 0  Edinburgh Postnatal Depression Scale Total 16      After visit meds:  Allergies as of 12/06/2020       Reactions   Other Hives, Other (See Comments)   Oral CT contrast Oral CT contrast   Diatrizoate Hives   PO only.  Tolerates IV contrast.   Iodinated Diagnostic Agents Hives   Nexium [esomeprazole Magnesium] Swelling        Medication List    You have not been prescribed any medications.      Discharge home in stable condition Infant Feeding: Bottle and Breast Infant Disposition:home with mother Discharge instruction: per  After Visit Summary and Postpartum booklet. Activity: Advance as tolerated. Pelvic rest for 6 weeks.  Diet: routine diet Anticipated Birth Control: BTL done PP Postpartum Appointment:2 weeks Additional Postpartum F/U: Postpartum Depression checkup Future Appointments: Future Appointments  Date Time Provider [place]  12/19/2020 11:30 AM Schuman, Stefanie Libel, MD [facility] None   Follow up Visit:  Follow-up Information     Homero Fellers, MD. Go on 12/19/2020.   Specialty: Obstetrics and Gynecology Why: @ 11:30 am incision check Contact information: [address] [place] [address] [phone]                     12/12/2020 Imagene Riches, CNM

## 2020-12-05 NOTE — Progress Notes (Signed)
Michele Alexander is stable after delivery. Pt's support person is at bedside. Pt bonding with infant and performing skin to skin after delivery. Pt is stable and ambulatory to the bathroom, voided a sufficient amount, and tolerated activity. Pt transferred to mother/baby unit RM . Report given to  Alycia Patten, RN

## 2020-12-06 ENCOUNTER — Inpatient Hospital Stay: Payer: Medicaid Other | Admitting: Certified Registered Nurse Anesthetist

## 2020-12-06 ENCOUNTER — Encounter: Payer: Medicaid Other | Admitting: Advanced Practice Midwife

## 2020-12-06 ENCOUNTER — Encounter: Payer: Self-pay | Admitting: Advanced Practice Midwife

## 2020-12-06 ENCOUNTER — Encounter
Admission: EM | Disposition: A | Discharge status: Home / Self Care | Payer: Self-pay | Source: Home / Self Care | Attending: Obstetrics and Gynecology

## 2020-12-06 DIAGNOSIS — Z302 Encounter for sterilization: Secondary | ICD-10-CM

## 2020-12-06 HISTORY — PX: TUBAL LIGATION: SHX77

## 2020-12-06 LAB — CBC
HCT: 26.8 % — ABNORMAL LOW (ref 36.0–46.0)
HCT: 31.2 % — ABNORMAL LOW (ref 36.0–46.0)
Hemoglobin: 8.4 g/dL — ABNORMAL LOW (ref 12.0–15.0)
Hemoglobin: 9.3 g/dL — ABNORMAL LOW (ref 12.0–15.0)
MCH: 20.7 pg — ABNORMAL LOW (ref 26.0–34.0)
MCH: 20.5 pg — ABNORMAL LOW (ref 26.0–34.0)
MCHC: 31.3 g/dL (ref 30.0–36.0)
MCHC: 29.8 g/dL — ABNORMAL LOW (ref 30.0–36.0)
MCV: 66 fL — ABNORMAL LOW (ref 80.0–100.0)
MCV: 68.9 fL — ABNORMAL LOW (ref 80.0–100.0)
Platelets: 289 10*3/uL (ref 150–400)
Platelets: 324 10*3/uL (ref 150–400)
RBC: 4.06 MIL/uL (ref 3.87–5.11)
RBC: 4.53 MIL/uL (ref 3.87–5.11)
RDW: 18.5 % — ABNORMAL HIGH (ref 11.5–15.5)
RDW: 18.7 % — ABNORMAL HIGH (ref 11.5–15.5)
WBC: 13.8 10*3/uL — ABNORMAL HIGH (ref 4.0–10.5)
WBC: 11.5 10*3/uL — ABNORMAL HIGH (ref 4.0–10.5)
nRBC: 0 % (ref 0.0–0.2)
nRBC: 0 % (ref 0.0–0.2)

## 2020-12-06 SURGERY — LIGATION, FALLOPIAN TUBE, POSTPARTUM
Anesthesia: General

## 2020-12-06 MED ORDER — 0.9 % SODIUM CHLORIDE (POUR BTL) OPTIME
TOPICAL | Status: DC | PRN
  Administered 2020-12-06: 100 mL

## 2020-12-06 MED ORDER — KETOROLAC TROMETHAMINE 30 MG/ML IJ SOLN
INTRAMUSCULAR | Status: DC | PRN
  Administered 2020-12-06: 30 mg via INTRAVENOUS

## 2020-12-06 MED ORDER — ACETAMINOPHEN 10 MG/ML IV SOLN
INTRAVENOUS | Status: AC
  Filled 2020-12-06: qty 100

## 2020-12-06 MED ORDER — PROMETHAZINE HCL 25 MG/ML IJ SOLN
6.2500 mg | INTRAMUSCULAR | Status: DC | PRN
Start: 2020-12-06 — End: 2020-12-06

## 2020-12-06 MED ORDER — LACTATED RINGERS IV SOLN: INTRAVENOUS | Status: DC

## 2020-12-06 MED ORDER — SUCCINYLCHOLINE CHLORIDE 20 MG/ML IJ SOLN
INTRAMUSCULAR | Status: DC | PRN
  Administered 2020-12-06: 100 mg via INTRAVENOUS

## 2020-12-06 MED ORDER — POVIDONE-IODINE 10 % EX SWAB: 2.0000 "application " | Freq: Once | CUTANEOUS | Status: DC

## 2020-12-06 MED ORDER — LIDOCAINE HCL (CARDIAC) PF 100 MG/5ML IV SOSY
PREFILLED_SYRINGE | INTRAVENOUS | Status: DC | PRN
  Administered 2020-12-06: 100 mg via INTRAVENOUS

## 2020-12-06 MED ORDER — ACETAMINOPHEN 10 MG/ML IV SOLN
INTRAVENOUS | Status: DC | PRN
  Administered 2020-12-06: 1000 mg via INTRAVENOUS

## 2020-12-06 MED ORDER — PROPOFOL 10 MG/ML IV BOLUS
INTRAVENOUS | Status: DC | PRN
  Administered 2020-12-06: 110 mg via INTRAVENOUS

## 2020-12-06 MED ORDER — ROCURONIUM BROMIDE 100 MG/10ML IV SOLN
INTRAVENOUS | Status: DC | PRN
  Administered 2020-12-06: 20 mg via INTRAVENOUS

## 2020-12-06 MED ORDER — SUGAMMADEX SODIUM 200 MG/2ML IV SOLN
INTRAVENOUS | Status: DC | PRN
  Administered 2020-12-06: 200 mg via INTRAVENOUS

## 2020-12-06 MED ORDER — MIDAZOLAM HCL 2 MG/2ML IJ SOLN
INTRAMUSCULAR | Status: DC | PRN
  Administered 2020-12-06: 1 mg via INTRAVENOUS

## 2020-12-06 MED ORDER — FENTANYL CITRATE (PF) 100 MCG/2ML IJ SOLN
INTRAMUSCULAR | Status: DC | PRN
  Administered 2020-12-06 (×2): 50 ug via INTRAVENOUS

## 2020-12-06 MED ORDER — CEFAZOLIN SODIUM-DEXTROSE 2-4 GM/100ML-% IV SOLN
2.0000 g | INTRAVENOUS | Status: AC
  Administered 2020-12-06: 2 g via INTRAVENOUS
  Filled 2020-12-06: qty 100

## 2020-12-06 MED ORDER — LIDOCAINE HCL (PF) 2 % IJ SOLN
INTRAMUSCULAR | Status: AC
  Filled 2020-12-06: qty 5

## 2020-12-06 MED ORDER — ONDANSETRON HCL 4 MG/2ML IJ SOLN
INTRAMUSCULAR | Status: AC
  Filled 2020-12-06: qty 2

## 2020-12-06 MED ORDER — FENTANYL CITRATE (PF) 100 MCG/2ML IJ SOLN
INTRAMUSCULAR | Status: AC
  Filled 2020-12-06: qty 2

## 2020-12-06 MED ORDER — POVIDONE-IODINE 10 % EX SWAB
2.0000 "application " | Freq: Once | CUTANEOUS | Status: AC
  Administered 2020-12-06: 2 via TOPICAL

## 2020-12-06 MED ORDER — DOCUSATE SODIUM 100 MG PO CAPS
100.0000 mg | ORAL_CAPSULE | Freq: Two times a day (BID) | ORAL | Status: DC
  Administered 2020-12-06 (×2): 100 mg via ORAL
  Filled 2020-12-06 (×2): qty 1

## 2020-12-06 MED ORDER — DEXAMETHASONE SODIUM PHOSPHATE 10 MG/ML IJ SOLN
INTRAMUSCULAR | Status: AC
  Filled 2020-12-06: qty 1

## 2020-12-06 MED ORDER — BUPIVACAINE HCL (PF) 0.5 % IJ SOLN
INTRAMUSCULAR | Status: AC
  Filled 2020-12-06: qty 30

## 2020-12-06 MED ORDER — MIDAZOLAM HCL 2 MG/2ML IJ SOLN
INTRAMUSCULAR | Status: AC
  Filled 2020-12-06: qty 2

## 2020-12-06 MED ORDER — FENTANYL CITRATE (PF) 100 MCG/2ML IJ SOLN
25.0000 ug | INTRAMUSCULAR | Status: DC | PRN
Start: 2020-12-06 — End: 2020-12-06

## 2020-12-06 MED ORDER — BUPIVACAINE HCL 0.5 % IJ SOLN
INTRAMUSCULAR | Status: DC | PRN
  Administered 2020-12-06: 4 mL

## 2020-12-06 MED ORDER — OXYCODONE HCL 5 MG PO TABS
ORAL_TABLET | ORAL | Status: AC
  Filled 2020-12-06: qty 1

## 2020-12-06 MED ORDER — FENTANYL CITRATE (PF) 100 MCG/2ML IJ SOLN
INTRAMUSCULAR | Status: AC
  Administered 2020-12-06: 25 ug via INTRAVENOUS
  Filled 2020-12-06: qty 2

## 2020-12-06 MED ORDER — DEXAMETHASONE SODIUM PHOSPHATE 10 MG/ML IJ SOLN
INTRAMUSCULAR | Status: DC | PRN
  Administered 2020-12-06: 10 mg via INTRAVENOUS

## 2020-12-06 SURGICAL SUPPLY — 29 items
BACTOSHIELD CHG 4% 4OZ (MISCELLANEOUS)
BLADE SURG SZ11 CARB STEEL (BLADE) ×2 IMPLANT
CHLORAPREP W/TINT 26 (MISCELLANEOUS) ×2 IMPLANT
DERMABOND ADVANCED (GAUZE/BANDAGES/DRESSINGS) ×1
DERMABOND ADVANCED .7 DNX12 (GAUZE/BANDAGES/DRESSINGS) ×1 IMPLANT
DRAPE LAPAROTOMY 100X77 ABD (DRAPES) ×2 IMPLANT
ELECT CAUTERY BLADE 6.4 (BLADE) ×2 IMPLANT
ELECT REM PT RETURN 9FT ADLT (ELECTROSURGICAL) ×2
ELECTRODE REM PT RTRN 9FT ADLT (ELECTROSURGICAL) ×1 IMPLANT
GAUZE 4X4 16PLY ~~LOC~~+RFID DBL (SPONGE) ×2 IMPLANT
GLOVE SURG SYN 6.5 ES PF (GLOVE) ×4 IMPLANT
GLOVE SURG UNDER POLY LF SZ6.5 (GLOVE) ×4 IMPLANT
GOWN STRL REUS W/ TWL LRG LVL3 (GOWN DISPOSABLE) ×2 IMPLANT
GOWN STRL REUS W/TWL LRG LVL3 (GOWN DISPOSABLE) ×2
LABEL OR SOLS (LABEL) ×2 IMPLANT
MANIFOLD NEPTUNE II (INSTRUMENTS) ×2 IMPLANT
NEEDLE HYPO 22GX1.5 SAFETY (NEEDLE) ×2 IMPLANT
NS IRRIG 500ML POUR BTL (IV SOLUTION) ×2 IMPLANT
PACK BASIN MINOR ARMC (MISCELLANEOUS) ×2 IMPLANT
RETRACTOR RING XSMALL (MISCELLANEOUS) ×1 IMPLANT
RTRCTR WOUND ALEXIS 13CM XS SH (MISCELLANEOUS) ×2
SCRUB CHG 4% DYNA-HEX 4OZ (MISCELLANEOUS) IMPLANT
SPONGE T-LAP 4X18 ~~LOC~~+RFID (SPONGE) ×2 IMPLANT
SUT CHROMIC 0 CT 1 (SUTURE) ×4 IMPLANT
SUT MNCRL 4-0 (SUTURE) ×2
SUT MNCRL 4-0 27XMFL (SUTURE) ×2
SUT VICRYL 0 AB UR-6 (SUTURE) ×2 IMPLANT
SUTURE MNCRL 4-0 27XMF (SUTURE) ×2 IMPLANT
SYR 10ML LL (SYRINGE) ×2 IMPLANT

## 2020-12-06 NOTE — Op Note (Signed)
Operative Note  12/06/2020  PRE-OP DIAGNOSIS: Desire for permanent sterilization  POST-OP DIAGNOSIS: same  SURGEON: Britta Louth MD  PROCEDURE: Postpartum Bilateral Tubal Ligation   ANESTHESIA: Choice   ESTIMATED BLOOD LOSS: 1 cc  SPECIMENS: Portion of left and right fallopian tube  COMPLICATIONS: None  DISPOSITION: PACU - hemodynamically stable.  CONDITION: stable  FINDINGS: Exam under anesthesia revealed small, mobile 20 uterus with no masses and bilateral adnexa without masses or fullness.   TECHNIQUE:  The patient was  prepped and draped in usual sterile fashion after adequate anesthesia is obtained in the supine position on the operating room table.  Local anesthesia is injected into the skin just inferior to the umbilicus, followed by a small elliptical incision with a scapel.  Fascia is identified and tented upwards, and an incision is made with Mayo scissors.  Identification of no adherent bowel is made. Retractors are placed and trendelenburg positioning is achieved.    The left fallopian tube was identified, grasped with the Babcock clamps, lifted to the skin incision and followed out distally to the fimbriae. An avascular midsection of the tube approximately 3-4cm from the cornua was grasped with the babcock clamps and brought into a knuckle at the skin incision. The tube was double ligated with 0-0 Chromic suture and the intervening portion of tube was transected and removed. Excellent hemostasis was noted and the tube was returned to the abdomen. Attention was then turned to the right fallopian tube after confirmation of identification by tracing the tube out to the fimbriae. The same procedure was then performed on the right fallopian tube. Again, excellent hemostasis was noted at the end of the procedure.  Retractors are removed and fascia closed with a 0-0 Vicryl suture. Irrigation and hemostasis confirmed.  Skin closed with a 4-0 monocryl suture in a subcuticular  fashion followed by skin adhesive.  Pt goes to recovery room in stable fashion.  All counts correct times 2.  Adrian Prows MD [facility] 12/06/2020 6:29 PM

## 2020-12-06 NOTE — Discharge Summary (Signed)
Dc order received via telephone from Dr. Gilman Schmidt. Per IT, all communication down for remote charting- verbal order to ok dc of all meds in order to print discharge paperwork.MD to call in all meds for pt tomorrow.

## 2020-12-06 NOTE — TOC Initial Note (Signed)
Transition of Care [facility]) - Initial/Assessment Note    Patient Details  Name: Michele Alexander MRN: [id number] Date of Birth: 18-Jun-1995  Transition of [facility]) CM/SW Contact:    Kerin Salen, RN Phone Number: 12/06/2020, 3:01 PM  Clinical Narrative:  Maryfrances Bunnell assessment. Patient admits to having postpartum depression with first child five years ago, is currently taking Lexapro everyday as prescribed. Verbalizes and able to acknowledge signs and symptoms, says she is on top of it and have a great support system with the father, her mother who lives nearby. Father lives in the home and is asleep at bedside. Patient states her PCP [facility] Smoker at [facility]. HD has made referral to her to receive MH evaluation. Patient states she feels safe at home, also have an assigned DSS caseworker and get food stamps. She will notify caseworker about newborn. This patients third child and she is scheduled for tubal ligation this afternoon. No barriers assessed, patient knowledgeable about post partum depression signs and will follow up and take medication as prescribed.                    Patient Goals and CMS Choice        Expected Discharge Plan and Services                                                Prior Living Arrangements/Services                       Activities of Daily Living Home Assistive Devices/Equipment: None ADL Screening (condition at time of admission) Patient's cognitive ability adequate to safely complete daily activities?: Yes Is the patient deaf or have difficulty hearing?: No Does the patient have difficulty seeing, even when wearing glasses/contacts?: No Does the patient have difficulty concentrating, remembering, or making decisions?: No Patient able to express need for assistance with ADLs?: Yes Does the patient have difficulty dressing or bathing?: No Independently performs ADLs?: Yes (appropriate for developmental age) Does the patient  have difficulty walking or climbing stairs?: No Weakness of Legs: None Weakness of Arms/Hands: None  Permission Sought/Granted                  Emotional Assessment              Admission diagnosis:  Encounter for elective induction of labor [Z34.90] Patient Active Problem List   Diagnosis Date Noted   Encounter for elective induction of labor 12/05/2020   Vaginal delivery    Postpartum care following vaginal delivery    Normal labor and delivery 11/26/2020   Anxiety 08/30/2020   History of postpartum depression, currently pregnant 02/28/2019   Supervision of normal pregnancy 08/31/2018   History of Hodgkin's lymphoma 04/23/2015   Sickle cell trait ([place]) 04/23/2015   PCP:  [facility], [facility] Dept Personal Pharmacy:   [facility] [address] [place] - [place] [address] [place]  [address] Phone: [phone] Fax: [place], [place] - [place] [address] Phone: [phone] Fax: [phone]  [place], [place] - [address] [place] [address] Phone: [phone] Fax: [phone]     Social Determinants of Health (SDOH) Interventions  Readmission Risk Interventions No flowsheet data found.   

## 2020-12-06 NOTE — Progress Notes (Signed)
Subjective:  Doing well postpartum day 1: she is currently NPO awaiting tubal ligation this afternoon. She has tolerated regular diet. Her pain is well controlled with PO medications. She is ambulating and voiding without difficulty. She reports breastfeeding is going well. She reports some hot flashes and denies any s/s of infection.   Objective:  Vital signs in last 24 hours: Temp:  [96.8 F (36 C)-98.6 F (37 C)] 96.8 F (36 C) (06/30 1004) Pulse Rate:  [62-136] 62 (06/30 0807) Resp:  [16-18] 16 (06/30 0807) BP: (107-136)/(73-92) 111/79 (06/30 0807) SpO2:  [99 %-100 %] 100 % (06/30 0807)    General: NAD Pulmonary: no increased work of breathing Abdomen: non-distended, non-tender, fundus firm at level of umbilicus Extremities: no edema, no erythema, no tenderness  Results for orders placed or performed during the hospital encounter of 12/05/20 (from the past 72 hour(s))  Type and screen     Status: None   Collection Time: 12/05/20 10:10 AM  Result Value Ref Range   ABO/RH(D) O POS    Antibody Screen NEG    Sample Expiration      12/08/2020,2359 Performed at [facility] [address] [place], [place] [address]   RPR     Status: None   Collection Time: 12/05/20 10:10 AM  Result Value Ref Range   RPR Ser Ql NON REACTIVE NON REACTIVE    Comment: Performed at [facility], [address], [place], [place] [address]  Resp Panel by RT-PCR (Flu A&B, Covid) Nasopharyngeal Swab     Status: None   Collection Time: 12/05/20 10:10 AM   Specimen: Nasopharyngeal Swab; Nasopharyngeal(NP) swabs in vial transport medium  Result Value Ref Range   SARS Coronavirus 2 by RT PCR NEGATIVE NEGATIVE    Comment: (NOTE) SARS-CoV-2 target nucleic acids are NOT DETECTED.  The SARS-CoV-2 RNA is generally detectable in upper respiratory specimens during the acute phase of infection. The lowest concentration of SARS-CoV-2 viral copies this assay can detect is 138 copies/mL. A  negative result does not preclude SARS-Cov-2 infection and should not be used as the sole basis for treatment or other patient management decisions. A negative result may occur with  improper specimen collection/handling, submission of specimen other than nasopharyngeal swab, presence of viral mutation(s) within the areas targeted by this assay, and inadequate number of viral copies(<138 copies/mL). A negative result must be combined with clinical observations, patient history, and epidemiological information. The expected result is Negative.  Fact Sheet for Patients:  EntrepreneurPulse.com.au  Fact Sheet for Healthcare Providers:  IncredibleEmployment.be  This test is no t yet approved or cleared by the [place] FDA and  has been authorized for detection and/or diagnosis of SARS-CoV-2 by FDA under an Emergency Use Authorization (EUA). This EUA will remain  in effect (meaning this test can be used) for the duration of the COVID-19 declaration under Section 564(b)(1) of the Act, 21 U.S.C.section 360bbb-3(b)(1), unless the authorization is terminated  or revoked sooner.       Influenza A by PCR NEGATIVE NEGATIVE   Influenza B by PCR NEGATIVE NEGATIVE    Comment: (NOTE) The Xpert Xpress SARS-CoV-2/FLU/RSV plus assay is intended as an aid in the diagnosis of influenza from Nasopharyngeal swab specimens and should not be used as a sole basis for treatment. Nasal washings and aspirates are unacceptable for Xpert Xpress SARS-CoV-2/FLU/RSV testing.  Fact Sheet for Patients: EntrepreneurPulse.com.au  Fact Sheet for Healthcare Providers: IncredibleEmployment.be  This test is not yet approved or  cleared by the [place] and has been authorized for detection and/or diagnosis of SARS-CoV-2 by FDA under an Emergency Use Authorization (EUA). This EUA will remain in effect (meaning this test can be used)  for the duration of the COVID-19 declaration under Section 564(b)(1) of the Act, 21 U.S.C. section 360bbb-3(b)(1), unless the authorization is terminated or revoked.  Performed at [facility], [address] [place], [place] [address]   CBC     Status: Abnormal   Collection Time: 12/05/20 10:11 AM  Result Value Ref Range   WBC 12.8 (H) 4.0 - 10.5 K/uL   RBC 4.68 3.87 - 5.11 MIL/uL   Hemoglobin 9.4 (L) 12.0 - 15.0 g/dL   HCT 30.8 (L) 36.0 - 46.0 %   MCV 65.8 (L) 80.0 - 100.0 fL   MCH 20.1 (L) 26.0 - 34.0 pg   MCHC 30.5 30.0 - 36.0 g/dL   RDW 18.6 (H) 11.5 - 15.5 %   Platelets 326 150 - 400 K/uL   nRBC 0.2 0.0 - 0.2 %    Comment: Performed at [facility], [address] [place], [place] [address]  CBC     Status: Abnormal   Collection Time: 12/06/20  5:50 AM  Result Value Ref Range   WBC 13.8 (H) 4.0 - 10.5 K/uL   RBC 4.06 3.87 - 5.11 MIL/uL   Hemoglobin 8.4 (L) 12.0 - 15.0 g/dL    Comment: Reticulocyte Hemoglobin testing may be clinically indicated, consider ordering this additional test [id number]    HCT 26.8 (L) 36.0 - 46.0 %   MCV 66.0 (L) 80.0 - 100.0 fL   MCH 20.7 (L) 26.0 - 34.0 pg   MCHC 31.3 30.0 - 36.0 g/dL   RDW 18.5 (H) 11.5 - 15.5 %   Platelets 289 150 - 400 K/uL   nRBC 0.0 0.0 - 0.2 %    Comment: Performed at [facility], [address], [place],  [address]    Assessment:   25 y.o. [phone] postpartum day # 1, lactating  Plan:    1) Acute blood loss anemia - hemodynamically stable and asymptomatic - po ferrous sulfate  2) Blood Type --/--/O POS (06/29 1010) / Rubella <0.90 (11/17 1521) / Varicella Non-Immune  3) TDAP status  given antepartum  4) Feeding plan breast  5)  Education given regarding options for contraception, as well as compatibility with breast feeding if applicable.  Patient plans on tubal ligation for contraception.  6) Disposition: continue current care   Rod Can, [facility] 12/06/2020, 10:28 AM

## 2020-12-06 NOTE — Progress Notes (Signed)
Dr [place] Schmidt notified of pt's request to d/c home tonight. Pt was very tearful when arriving from pacu stating "I'd really like to go home if I can. I really miss my kids". Pt able to ambulate, keep down liquids and small amounts of food, has voided and stated her pain is tolerable (6/10 only when lying down). Orders for d/c placed. VSS. Iv d/c'd. Pt rolled down to car via wheelchair in the company of husband and baby.

## 2020-12-06 NOTE — Anesthesia Procedure Notes (Signed)
Procedure Name: Intubation Date/Time: 12/06/2020 5:38 PM Performed by: Allean Found, CRNA Pre-anesthesia Checklist: Patient identified, Patient being monitored, Timeout performed, Emergency Drugs available and Suction available Patient Re-evaluated:Patient Re-evaluated prior to induction Oxygen Delivery Method: Circle system utilized Preoxygenation: Pre-oxygenation with 100% oxygen Induction Type: IV induction Ventilation: Mask ventilation without difficulty Laryngoscope Size: McGraph and 3 Grade View: Grade II Tube type: Oral Tube size: 7.0 mm Number of attempts: 1 Airway Equipment and Method: Stylet Placement Confirmation: ETT inserted through vocal cords under direct vision, positive ETCO2 and breath sounds checked- equal and bilateral Secured at: 21 cm Tube secured with: Tape Dental Injury: Teeth and Oropharynx as per pre-operative assessment

## 2020-12-06 NOTE — Anesthesia Preprocedure Evaluation (Addendum)
Anesthesia Evaluation  Patient identified by MRN, date of birth, ID band Patient awake    Reviewed: Allergy & Precautions, H&P , NPO status , Patient's Chart, lab work & pertinent test results  History of Anesthesia Complications Negative for: history of anesthetic complications  Airway Mallampati: III  TM Distance: >3 FB Neck ROM: full    Dental  (+) Dental Advidsory Given   Pulmonary neg pulmonary ROS,    Pulmonary exam normal        Cardiovascular Exercise Tolerance: Good negative cardio ROS       Neuro/Psych PSYCHIATRIC DISORDERS Anxiety Depression    GI/Hepatic Neg liver ROS, GERD  ,  Endo/Other  negative endocrine ROS  Renal/GU negative Renal ROS  negative genitourinary   Musculoskeletal   Abdominal   Peds  Hematology  (+) Blood dyscrasia, Sickle cell trait ,   Anesthesia Other Findings Past Medical History: No date: Anemia No date: Anxiety No date: Blood transfusion without reported diagnosis No date: Cancer [facility])     Comment:  Hodgkin's Lymphoma No date: Depression No date: Hodgkin's disease(201) 01/15/2015: Nausea and vomiting during pregnancy No date: Sickle cell trait (HCC)     Comment:  traits   Reproductive/Obstetrics negative OB ROS                             Anesthesia Physical  Anesthesia Plan  ASA: 2  Anesthesia Plan: General   Post-op Pain Management:    Induction: Rapid sequence, Cricoid pressure planned and Intravenous  PONV Risk Score and Plan: 2  Airway Management Planned: Oral ETT  Additional Equipment:   Intra-op Plan:   Post-operative Plan: Extubation in OR  Informed Consent: I have reviewed the patients History and Physical, chart, labs and discussed the procedure including the risks, benefits and alternatives for the proposed anesthesia with the patient or authorized representative who has indicated his/her understanding and acceptance.        Plan Discussed with: CRNA  Anesthesia Plan Comments:        Anesthesia Quick Evaluation

## 2020-12-06 NOTE — Interval H&P Note (Signed)
History and Physical Interval Note:  12/06/2020 4:57 PM  Michele Alexander  has presented today for surgery, with the diagnosis of Desires Sterilization.  The various methods of treatment have been discussed with the patient and family. After consideration of risks, benefits and other options for treatment, the patient has consented to  Procedure(s): POST PARTUM TUBAL LIGATION (N/A) as a surgical intervention.  The patient's history has been reviewed, patient examined, no change in status, stable for surgery.  I have reviewed the patient's chart and labs.  Questions were answered to the patient's satisfaction.     [place]

## 2020-12-06 NOTE — Transfer of Care (Signed)
Immediate Anesthesia Transfer of Care Note  Patient: Michele Alexander  Procedure(s) Performed: POST PARTUM TUBAL LIGATION  Patient Location: PACU  Anesthesia Type:General  Level of Consciousness: sedated  Airway & Oxygen Therapy: Patient Spontanous Breathing and Patient connected to face mask oxygen  Post-op Assessment: Report given to RN and Post -op Vital signs reviewed and stable  Post vital signs: Reviewed  Last Vitals:  Vitals Value Taken Time  BP    Temp    Pulse 63 12/06/20 1825  Resp 12 12/06/20 1825  SpO2 100 % 12/06/20 1825  Vitals shown include unvalidated device data.  Last Pain:  Vitals:   12/06/20 1555  TempSrc: Oral  PainSc: 0-No pain      Patients Stated Pain Goal: 0 (66/06/30 1601)  Complications: No notable events documented.

## 2020-12-07 ENCOUNTER — Encounter: Payer: Self-pay | Admitting: Obstetrics and Gynecology

## 2020-12-07 ENCOUNTER — Other Ambulatory Visit: Payer: Self-pay | Admitting: Obstetrics and Gynecology

## 2020-12-07 DIAGNOSIS — G8918 Other acute postprocedural pain: Secondary | ICD-10-CM

## 2020-12-07 MED ORDER — OXYCODONE HCL 5 MG PO TABS: 5.0000 mg | ORAL_TABLET | Freq: Four times a day (QID) | ORAL | 0 refills | Status: DC | PRN

## 2020-12-07 NOTE — Progress Notes (Signed)
Pt called and notified that she needs to request varicella and rubella vaccines at her follow up appointment

## 2020-12-11 LAB — SURGICAL PATHOLOGY

## 2020-12-19 ENCOUNTER — Encounter: Payer: Self-pay | Admitting: Obstetrics and Gynecology

## 2020-12-19 ENCOUNTER — Ambulatory Visit (INDEPENDENT_AMBULATORY_CARE_PROVIDER_SITE_OTHER): Payer: Medicaid Other | Admitting: Obstetrics and Gynecology

## 2020-12-19 ENCOUNTER — Other Ambulatory Visit: Payer: Self-pay

## 2020-12-19 DIAGNOSIS — O99345 Other mental disorders complicating the puerperium: Secondary | ICD-10-CM

## 2020-12-19 DIAGNOSIS — F53 Postpartum depression: Secondary | ICD-10-CM

## 2020-12-19 MED ORDER — CITALOPRAM HYDROBROMIDE 20 MG PO TABS: 20.0000 mg | ORAL_TABLET | Freq: Every day | ORAL | 2 refills | Status: DC

## 2020-12-19 NOTE — Progress Notes (Signed)
Postpartum Visit  Chief Complaint:  Chief Complaint  Patient presents with   Postpartum Care    History of Present Illness: Patient is a 25 y.o. [id number] presents for postpartum visit.  Date of delivery: 12/05/2020 Type of delivery: vaginal Laceration: none  Pregnancy or labor problems: none  Breast Feeding:  yes- breast and bottle Lochia: less flow than a normal period Post partum depression/anxiety noted:  yes Edinburgh Post-Partum Depression Score:   14   Date of last PAP: 2020  normal  Any problems since the delivery:  no  Newborn Details:  SINGLETON :  1. Baby Gender:female.  Infant Status: Infant doing well at home with mother.   Review of Systems: Review of Systems  Constitutional:  Negative for chills, fever, malaise/fatigue and weight loss.  HENT:  Negative for congestion, hearing loss and sinus pain.   Eyes:  Negative for blurred vision and double vision.  Respiratory:  Negative for cough, sputum production, shortness of breath and wheezing.   Cardiovascular:  Negative for chest pain, palpitations, orthopnea and leg swelling.  Gastrointestinal:  Negative for abdominal pain, constipation, diarrhea, nausea and vomiting.  Genitourinary:  Negative for dysuria, flank pain, frequency, hematuria and urgency.  Musculoskeletal:  Negative for back pain, falls and joint pain.  Skin:  Negative for itching and rash.  Neurological:  Negative for dizziness and headaches.  Psychiatric/Behavioral:  Negative for depression, substance abuse and suicidal ideas. The patient is not nervous/anxious.     Past Medical History:  Past Medical History:  Diagnosis Date   Anemia    Anxiety    Blood transfusion without reported diagnosis    Cancer ([place])    Hodgkin's Lymphoma   Depression    Hodgkin's disease(201)    Nausea and vomiting during pregnancy 01/15/2015   Sickle cell trait ([place])    traits    Past Surgical History:  Past Surgical History:  Procedure Laterality Date    DILATION AND EVACUATION N/A 12/21/2015   Procedure: DILATATION AND EVACUATION;  Surgeon: Will Bonnet, MD;  Location: [facility] ORS;  Service: Gynecology;  Laterality: N/A;   NECK LESION BIOPSY     OTHER SURGICAL HISTORY N/A 2015   Endoscopy   PORT-A-CATH REMOVAL     PORTACATH PLACEMENT Left 2010   TUBAL LIGATION N/A 12/06/2020   Procedure: POST PARTUM TUBAL LIGATION;  Surgeon: Homero Fellers, MD;  Location: [facility] ORS;  Service: Gynecology;  Laterality: N/A;    Family History:  Family History  Problem Relation Age of Onset   Hypertension Mother    Diabetes Maternal Grandfather    Hypertension Maternal Grandfather    Cancer Maternal Grandfather    Cancer Maternal Grandmother    Asthma Sister    Asthma Daughter     Social History:  Social History   Socioeconomic History   Marital status: Married    Spouse name: Dylan   Number of children: 2   Years of education: Not on file   Highest education level: Not on file  Occupational History   Not on file  Tobacco Use   Smoking status: Never   Smokeless tobacco: Never  Vaping Use   Vaping Use: Never used  Substance and Sexual Activity   Alcohol use: No   Drug use: No   Sexual activity: Yes    Partners: Male    Birth control/protection: Surgical    Comment: signed  Other Topics Concern   Not on file  Social History Narrative   Right handed  Lives with husband in two story home   Social Determinants of Health   Financial Resource Strain: Not on file  Food Insecurity: Not on file  Transportation Needs: Not on file  Physical Activity: Not on file  Stress: Not on file  Social Connections: Not on file  Intimate Partner Violence: Not on file    Allergies:  Allergies  Allergen Reactions   Other Hives and Other (See Comments)    Oral CT contrast Oral CT contrast   Diatrizoate Hives    PO only.  Tolerates IV contrast.   Iodinated Diagnostic Agents Hives   Nexium [Esomeprazole Magnesium] Swelling     Medications: Prior to Admission medications   Medication Sig Start Date End Date Taking? Authorizing Provider  oxyCODONE (ROXICODONE) 5 MG immediate release tablet Take 1 tablet (5 mg total) by mouth every 6 (six) hours as needed for severe pain. Patient not taking: Reported on 12/19/2020 12/07/20   Homero Fellers, MD    Physical Exam Vitals:  Vitals:   12/19/20 1148  BP: 108/70    OBGyn Exam  Assessment: 25 y.o. [id number] presenting for 6 week postpartum visit  Plan: Problem List Items Addressed This Visit       Other   Postpartum care following vaginal delivery - Primary   Other Visit Diagnoses     Postpartum depression       Relevant Medications   citalopram (CELEXA) 20 MG tablet        1) Contraception-  Sterilization performed postpartum  2)  Pap: ASCCP guidelines and rational discussed.  Pap 08/31/2018 NIL- due in 2023.  3) Patient underwent screening for postpartum depression with concerns noted. Discussed options. She would like to trial zulresso infusuin. Will start celexa as she gets set up for infusion.    - Follow up in 2 weeks  Adrian Prows MD, [place], [facility] 12/19/2020 11:57 AM

## 2020-12-31 NOTE — Anesthesia Postprocedure Evaluation (Signed)
Anesthesia Post Note  Patient: Michele Alexander  Procedure(s) Performed: POST PARTUM TUBAL LIGATION  Patient location during evaluation: PACU Anesthesia Type: General Level of consciousness: awake and alert Pain management: pain level controlled Vital Signs Assessment: post-procedure vital signs reviewed and stable Respiratory status: spontaneous breathing, nonlabored ventilation, respiratory function stable and patient connected to nasal cannula oxygen Cardiovascular status: blood pressure returned to baseline and stable Postop Assessment: no apparent nausea or vomiting Anesthetic complications: no   No notable events documented.   Last Vitals:  Vitals:   12/06/20 1950 12/06/20 2044  BP: 139/88 133/89  Pulse: 69 70  Resp: 14 16  Temp: 36.4 C 36.6 C  SpO2: 100% 100%    Last Pain:  Vitals:   12/06/20 2200  TempSrc:   PainSc: [employer] Marisha Renier

## 2021-01-03 ENCOUNTER — Encounter: Payer: Medicaid Other | Admitting: Obstetrics and Gynecology

## 2021-01-09 ENCOUNTER — Telehealth: Payer: Self-pay

## 2021-01-09 NOTE — Telephone Encounter (Signed)
Please forward to Clarene Critchley is they need to speak with her or call and let the patient know when her appointment is.

## 2021-01-09 NOTE — Telephone Encounter (Signed)
Caller from [facility] called after hour nurse 01/08/21 5:33pm wanting to speak c Clarene Critchley.  Caller is looking to let the pt is scheduled for treatment on 01/29/21.  [phone]

## 2021-01-22 ENCOUNTER — Encounter: Payer: Medicaid Other | Admitting: Obstetrics and Gynecology

## 2021-01-22 ENCOUNTER — Ambulatory Visit: Payer: Medicaid Other | Admitting: Obstetrics and Gynecology

## 2021-01-28 ENCOUNTER — Telehealth: Payer: Self-pay

## 2021-01-28 NOTE — Telephone Encounter (Signed)
Michele Alexander from [facility] calling to confirm that she is scheduled for her infusion tx in [facility] tomorrow; wanted to relay this information to [place].  [phone]

## 2021-06-11 ENCOUNTER — Emergency Department
Admission: EM | Admit: 2021-06-11 | Discharge: 2021-06-11 | Disposition: A | Discharge status: Home / Self Care | Payer: Medicaid Other | Attending: Student in an Organized Health Care Education/Training Program | Admitting: Student in an Organized Health Care Education/Training Program

## 2021-06-11 ENCOUNTER — Other Ambulatory Visit: Payer: Self-pay

## 2021-06-11 DIAGNOSIS — R112 Nausea with vomiting, unspecified: Secondary | ICD-10-CM | POA: Diagnosis not present

## 2021-06-11 DIAGNOSIS — R1013 Epigastric pain: Secondary | ICD-10-CM | POA: Diagnosis present

## 2021-06-11 DIAGNOSIS — Z5321 Procedure and treatment not carried out due to patient leaving prior to being seen by health care provider: Secondary | ICD-10-CM | POA: Diagnosis not present

## 2021-06-11 LAB — COMPREHENSIVE METABOLIC PANEL
ALT: 205 U/L — ABNORMAL HIGH (ref 0–44)
AST: 433 U/L — ABNORMAL HIGH (ref 15–41)
Albumin: 3.9 g/dL (ref 3.5–5.0)
Alkaline Phosphatase: 194 U/L — ABNORMAL HIGH (ref 38–126)
Anion gap: 8 (ref 5–15)
BUN: 10 mg/dL (ref 6–20)
CO2: 24 mmol/L (ref 22–32)
Calcium: 8.6 mg/dL — ABNORMAL LOW (ref 8.9–10.3)
Chloride: 106 mmol/L (ref 98–111)
Creatinine, Ser: 0.66 mg/dL (ref 0.44–1.00)
GFR, Estimated: >60 mL/min (ref >60)
Glucose, Bld: 186 mg/dL — ABNORMAL HIGH (ref 70–99)
Potassium: 4.1 mmol/L (ref 3.5–5.1)
Sodium: 138 mmol/L (ref 135–145)
Total Bilirubin: 2.5 mg/dL — ABNORMAL HIGH (ref 0.3–1.2)
Total Protein: 7.3 g/dL (ref 6.5–8.1)

## 2021-06-11 LAB — URINALYSIS, ROUTINE W REFLEX MICROSCOPIC
Bacteria, UA: NONE SEEN
Glucose, UA: 50 mg/dL — AB
Ketones, ur: 5 mg/dL — AB
Leukocytes,Ua: NEGATIVE
Nitrite: NEGATIVE
Protein, ur: 30 mg/dL — AB
Specific Gravity, Urine: 1.016 (ref 1.005–1.030)
pH: 5 (ref 5.0–8.0)

## 2021-06-11 LAB — CBC
HCT: 36.1 % (ref 36.0–46.0)
Hemoglobin: 11 g/dL — ABNORMAL LOW (ref 12.0–15.0)
MCH: 21 pg — ABNORMAL LOW (ref 26.0–34.0)
MCHC: 30.5 g/dL (ref 30.0–36.0)
MCV: 68.8 fL — ABNORMAL LOW (ref 80.0–100.0)
Platelets: 605 10*3/uL — ABNORMAL HIGH (ref 150–400)
RBC: 5.25 MIL/uL — ABNORMAL HIGH (ref 3.87–5.11)
RDW: 17.8 % — ABNORMAL HIGH (ref 11.5–15.5)
WBC: 19.2 10*3/uL — ABNORMAL HIGH (ref 4.0–10.5)
nRBC: 0 % (ref 0.0–0.2)

## 2021-06-11 LAB — POC URINE PREG, ED: Preg Test, Ur: NEGATIVE

## 2021-06-11 LAB — LIPASE, BLOOD: Lipase: 1384 U/L — ABNORMAL HIGH (ref 11–51)

## 2021-06-11 NOTE — ED Triage Notes (Signed)
Pt comes into the ED via EMS from home with c/o upper/epigastric pain with N/V today  Zofran 4mg  IV #20gRAC 130/80 HR110 20RR 100%RA

## 2022-04-15 ENCOUNTER — Telehealth: Payer: Self-pay

## 2022-04-15 NOTE — Telephone Encounter (Signed)
Pt calling; wants to know if we do IVF and if so could she have some information on it; thought her last child would be the last but now she and her hsb want to try to have another baby some way some how.  [phone]

## 2022-04-15 NOTE — Telephone Encounter (Signed)
VM not set up; pt should check with her ins to see where IVF is covered if at all; ? Ref can be sent to [facility].

## 2022-05-12 ENCOUNTER — Encounter (HOSPITAL_COMMUNITY): Payer: Self-pay | Admitting: *Deleted

## 2022-05-12 ENCOUNTER — Other Ambulatory Visit: Payer: Self-pay

## 2022-05-12 ENCOUNTER — Emergency Department (HOSPITAL_COMMUNITY)
Admission: EM | Admit: 2022-05-12 | Discharge: 2022-05-12 | Disposition: A | Discharge status: Home / Self Care | Payer: Medicaid Other | Attending: Student | Admitting: Student

## 2022-05-12 DIAGNOSIS — N9489 Other specified conditions associated with female genital organs and menstrual cycle: Secondary | ICD-10-CM | POA: Insufficient documentation

## 2022-05-12 DIAGNOSIS — Z8571 Personal history of Hodgkin lymphoma: Secondary | ICD-10-CM | POA: Diagnosis not present

## 2022-05-12 DIAGNOSIS — R112 Nausea with vomiting, unspecified: Secondary | ICD-10-CM | POA: Insufficient documentation

## 2022-05-12 DIAGNOSIS — M791 Myalgia, unspecified site: Secondary | ICD-10-CM | POA: Diagnosis not present

## 2022-05-12 DIAGNOSIS — R1013 Epigastric pain: Secondary | ICD-10-CM | POA: Diagnosis not present

## 2022-05-12 DIAGNOSIS — Z20822 Contact with and (suspected) exposure to covid-19: Secondary | ICD-10-CM | POA: Diagnosis not present

## 2022-05-12 LAB — CBC WITH DIFFERENTIAL/PLATELET
Abs Immature Granulocytes: 0.03 10*3/uL (ref 0.00–0.07)
Basophils Absolute: 0 10*3/uL (ref 0.0–0.1)
Basophils Relative: 0 %
Eosinophils Absolute: 0 10*3/uL (ref 0.0–0.5)
Eosinophils Relative: 0 %
HCT: 38.8 % (ref 36.0–46.0)
Hemoglobin: 11.8 g/dL — ABNORMAL LOW (ref 12.0–15.0)
Immature Granulocytes: 0 %
Lymphocytes Relative: 4 %
Lymphs Abs: 0.3 10*3/uL — ABNORMAL LOW (ref 0.7–4.0)
MCH: 21.3 pg — ABNORMAL LOW (ref 26.0–34.0)
MCHC: 30.4 g/dL (ref 30.0–36.0)
MCV: 70 fL — ABNORMAL LOW (ref 80.0–100.0)
Monocytes Absolute: 0.1 10*3/uL (ref 0.1–1.0)
Monocytes Relative: 2 %
Neutro Abs: 6.5 10*3/uL (ref 1.7–7.7)
Neutrophils Relative %: 94 %
Platelets: 429 10*3/uL — ABNORMAL HIGH (ref 150–400)
RBC: 5.54 MIL/uL — ABNORMAL HIGH (ref 3.87–5.11)
RDW: 17.3 % — ABNORMAL HIGH (ref 11.5–15.5)
WBC: 7 10*3/uL (ref 4.0–10.5)
nRBC: 0 % (ref 0.0–0.2)

## 2022-05-12 LAB — BASIC METABOLIC PANEL
Anion gap: 10 (ref 5–15)
BUN: 13 mg/dL (ref 6–20)
CO2: 24 mmol/L (ref 22–32)
Calcium: 9 mg/dL (ref 8.9–10.3)
Chloride: 103 mmol/L (ref 98–111)
Creatinine, Ser: 0.6 mg/dL (ref 0.44–1.00)
GFR, Estimated: >60 mL/min (ref >60)
Glucose, Bld: 97 mg/dL (ref 70–99)
Potassium: 3.2 mmol/L — ABNORMAL LOW (ref 3.5–5.1)
Sodium: 137 mmol/L (ref 135–145)

## 2022-05-12 LAB — RESP PANEL BY RT-PCR (FLU A&B, COVID) ARPGX2
Influenza A by PCR: NEGATIVE
Influenza B by PCR: NEGATIVE
SARS Coronavirus 2 by RT PCR: NEGATIVE

## 2022-05-12 LAB — HCG, QUANTITATIVE, PREGNANCY: hCG, Beta Chain, Quant, S: <1 m[IU]/mL (ref <5)

## 2022-05-12 MED ORDER — MAGNESIUM OXIDE -MG SUPPLEMENT 400 (240 MG) MG PO TABS
800.0000 mg | ORAL_TABLET | Freq: Once | ORAL | Status: AC
  Administered 2022-05-12: 800 mg via ORAL
  Filled 2022-05-12: qty 2

## 2022-05-12 MED ORDER — POTASSIUM CHLORIDE CRYS ER 20 MEQ PO TBCR
40.0000 meq | EXTENDED_RELEASE_TABLET | Freq: Once | ORAL | Status: AC
  Administered 2022-05-12: 40 meq via ORAL
  Filled 2022-05-12: qty 2

## 2022-05-12 MED ORDER — LACTATED RINGERS IV BOLUS
1000.0000 mL | Freq: Once | INTRAVENOUS | Status: AC
  Administered 2022-05-12: 1000 mL via INTRAVENOUS

## 2022-05-12 MED ORDER — ONDANSETRON HCL 4 MG PO TABS: 4.0000 mg | ORAL_TABLET | Freq: Four times a day (QID) | ORAL | 0 refills | Status: DC

## 2022-05-12 MED ORDER — ONDANSETRON HCL 4 MG/2ML IJ SOLN
4.0000 mg | Freq: Once | INTRAMUSCULAR | Status: AC
  Administered 2022-05-12: 4 mg via INTRAVENOUS
  Filled 2022-05-12: qty 2

## 2022-05-12 MED ORDER — KETOROLAC TROMETHAMINE 15 MG/ML IJ SOLN
15.0000 mg | Freq: Once | INTRAMUSCULAR | Status: AC
  Administered 2022-05-12: 15 mg via INTRAVENOUS
  Filled 2022-05-12: qty 1

## 2022-05-12 MED ORDER — NAPROXEN 375 MG PO TABS: 375.0000 mg | ORAL_TABLET | Freq: Two times a day (BID) | ORAL | 0 refills | Status: DC

## 2022-05-12 NOTE — ED Provider Notes (Signed)
[facility] EMERGENCY DEPARTMENT Provider Note  CSN: [id number] Arrival date & time: 05/12/22 1201  Chief Complaint(s) Emesis  HPI Michele Alexander is a 26 y.o. female with PMH Hodgkin's lymphoma currently in remission, cholecystitis and choledocholithiasis status post cholecystectomy anxiety, anemia who presents emergency department for evaluation of nausea, vomiting and myalgias.  Patient states that she has 2 sick contacts at home that been diagnosed with the flu.  She states that for the last 3 days she has had persistent nausea vomiting and generalized myalgias and is unable to tolerate p.o.  Endorses mild epigastric abdominal pain but denies chest pain, shortness of breath, headache, diarrhea or other systemic symptoms.   Past Medical History Past Medical History:  Diagnosis Date   Anemia    Anxiety    Blood transfusion without reported diagnosis    Cancer (HCC)    Hodgkin's Lymphoma   Depression    Hodgkin's disease(201)    Nausea and vomiting during pregnancy 01/15/2015   Sickle cell trait (HCC)    traits   Patient Active Problem List   Diagnosis Date Noted   Encounter for sterilization    Encounter for elective induction of labor 12/05/2020   Vaginal delivery    Postpartum care following vaginal delivery    Normal labor and delivery 11/26/2020   Anxiety 08/30/2020   History of postpartum depression, currently pregnant 02/28/2019   Supervision of normal pregnancy 08/31/2018   History of Hodgkin's lymphoma 04/23/2015   Sickle cell trait (HCC) 04/23/2015   Home Medication(s) Prior to Admission medications   Medication Sig Start Date End Date Taking? Authorizing Provider  citalopram (CELEXA) 20 MG tablet Take 1 tablet (20 mg total) by mouth daily. 12/19/20   Schuman, Jaquelyn Bitter, MD  oxyCODONE (ROXICODONE) 5 MG immediate release tablet Take 1 tablet (5 mg total) by mouth every 6 (six) hours as needed for severe pain. Patient not taking: Reported on 12/19/2020 12/07/20    Natale Milch, MD                                                                                                                                    Past Surgical History Past Surgical History:  Procedure Laterality Date   DILATION AND EVACUATION N/A 12/21/2015   Procedure: DILATATION AND EVACUATION;  Surgeon: Conard Novak, MD;  Location: [facility] ORS;  Service: Gynecology;  Laterality: N/A;   NECK LESION BIOPSY     OTHER SURGICAL HISTORY N/A 2015   Endoscopy   PORT-A-CATH REMOVAL     PORTACATH PLACEMENT Left 2010   TUBAL LIGATION N/A 12/06/2020   Procedure: POST PARTUM TUBAL LIGATION;  Surgeon: Natale Milch, MD;  Location: [facility] ORS;  Service: Gynecology;  Laterality: N/A;   Family History Family History  Problem Relation Age of Onset   Hypertension Mother    Diabetes Maternal Grandfather    Hypertension Maternal Grandfather    Cancer Maternal Grandfather  Cancer Maternal Grandmother    Asthma Sister    Asthma Daughter     Social History Social History   Tobacco Use   Smoking status: Never   Smokeless tobacco: Never  Vaping Use   Vaping Use: Never used  Substance Use Topics   Alcohol use: No   Drug use: No   Allergies Other, Diatrizoate, Iodinated contrast media, and Nexium [esomeprazole magnesium]  Review of Systems Review of Systems  Gastrointestinal:  Positive for abdominal pain, nausea and vomiting.    Physical Exam Vital Signs  I have reviewed the triage vital signs BP 100/66   Pulse 80   Temp 98.2 F (36.8 C) (Oral)   Resp 14   Ht 4\' 11"  (1.499 m)   Wt 43.1 kg   LMP 05/10/2022 Comment: positive pregnancy test at home 2 days ago.  SpO2 100%   BMI 19.19 kg/m   Physical Exam Vitals and nursing note reviewed.  Constitutional:      General: She is not in acute distress.    Appearance: She is well-developed.  HENT:     Head: Normocephalic and atraumatic.  Eyes:     Conjunctiva/sclera: Conjunctivae normal.  Cardiovascular:      Rate and Rhythm: Normal rate and regular rhythm.     Heart sounds: No murmur heard. Pulmonary:     Effort: Pulmonary effort is normal. No respiratory distress.     Breath sounds: Normal breath sounds.  Abdominal:     Palpations: Abdomen is soft.     Tenderness: There is no abdominal tenderness.  Musculoskeletal:        General: No swelling.     Cervical back: Neck supple.  Skin:    General: Skin is warm and dry.     Capillary Refill: Capillary refill takes less than 2 seconds.  Neurological:     Mental Status: She is alert.  Psychiatric:        Mood and Affect: Mood normal.     ED Results and Treatments Labs (all labs ordered are listed, but only abnormal results are displayed) Labs Reviewed  CBC WITH DIFFERENTIAL/PLATELET - Abnormal; Notable for the following components:      Result Value   RBC 5.54 (*)    Hemoglobin 11.8 (*)    MCV 70.0 (*)    MCH 21.3 (*)    RDW 17.3 (*)    Platelets 429 (*)    Lymphs Abs 0.3 (*)    All other components within normal limits  BASIC METABOLIC PANEL - Abnormal; Notable for the following components:   Potassium 3.2 (*)    All other components within normal limits  RESP PANEL BY RT-PCR (FLU A&B, COVID) ARPGX2  HCG, QUANTITATIVE, PREGNANCY                                                                                                                          Radiology No results found.  Pertinent labs & imaging results that were available  during my care of the patient were reviewed by me and considered in my medical decision making (see MDM for details).  Medications Ordered in ED Medications  lactated ringers bolus 1,000 mL (0 mLs Intravenous Stopped 05/12/22 2042)  ondansetron (ZOFRAN) injection 4 mg (4 mg Intravenous Given 05/12/22 1924)  ketorolac (TORADOL) 15 MG/ML injection 15 mg (15 mg Intravenous Given 05/12/22 1924)  potassium chloride SA (KLOR-CON M) CR tablet 40 mEq (40 mEq Oral Given 05/12/22 1925)  magnesium oxide (MAG-OX)  tablet 800 mg (800 mg Oral Given 05/12/22 1924)                                                                                                                                     Procedures Procedures  (including critical care time)  Medical Decision Making / ED Course   This patient presents to the ED for concern of abdominal pain, nausea, vomiting, this involves an extensive number of treatment options, and is a complaint that carries with it a high risk of complications and morbidity.  The differential diagnosis includes viral gastroenteritis, influenza, COVID-19, electrolyte abnormality, choledocholithiasis, intra-abdominal infection  MDM: Patient seen emerged part for evaluation of abdominal pain, nausea, vomiting and myalgias.  Physical exam largely unremarkable with a benign abdominal exam and no appreciable tenderness in the right upper quadrant or epigastrium.  Laboratory evaluation with a hemoglobin of 11.8 with an MCV of 70.0 consistent with her history of anemia.  Mild hypokalemia 3.2 which was repleted in the emergency department.  Patient received fluid resuscitation and nausea control and on reevaluation she is able to tolerate p.o. without difficulty and symptoms much improved.  Her COVID and flu test are negative here today but with her known sick contacts at home and symptoms associated with generalized myalgias, still suspect underlying viral pathology here.  I have low suspicion for recurrence of her Hodgkin's lymphoma and low suspicion for recurrent choledocholithiasis with no abdominal tenderness on exam.  She was given strict return precautions which she voiced understanding and she was discharged with outpatient follow-up.  At time of discharge, patient hemodynamically stable and symptoms improved.   Additional history obtained:  -External records from outside source obtained and reviewed including: Chart review including previous notes, labs, imaging, consultation  notes   Lab Tests: -I ordered, reviewed, and interpreted labs.   The pertinent results include:   Labs Reviewed  CBC WITH DIFFERENTIAL/PLATELET - Abnormal; Notable for the following components:      Result Value   RBC 5.54 (*)    Hemoglobin 11.8 (*)    MCV 70.0 (*)    MCH 21.3 (*)    RDW 17.3 (*)    Platelets 429 (*)    Lymphs Abs 0.3 (*)    All other components within normal limits  BASIC METABOLIC PANEL - Abnormal; Notable for the following components:   Potassium 3.2 (*)    All other components  within normal limits  RESP PANEL BY RT-PCR (FLU A&B, COVID) ARPGX2  HCG, QUANTITATIVE, PREGNANCY     Medicines ordered and prescription drug management: Meds ordered this encounter  Medications   lactated ringers bolus 1,000 mL   ondansetron (ZOFRAN) injection 4 mg   ketorolac (TORADOL) 15 MG/ML injection 15 mg   potassium chloride SA (KLOR-CON M) CR tablet 40 mEq   magnesium oxide (MAG-OX) tablet 800 mg    -I have reviewed the patients home medicines and have made adjustments as needed  Critical interventions none    Cardiac Monitoring: The patient was maintained on a cardiac monitor.  I personally viewed and interpreted the cardiac monitored which showed an underlying rhythm of: NSR  Social Determinants of Health:  Factors impacting patients care include: none   Reevaluation: After the interventions noted above, I reevaluated the patient and found that they have :improved  Co morbidities that complicate the patient evaluation  Past Medical History:  Diagnosis Date   Anemia    Anxiety    Blood transfusion without reported diagnosis    Cancer (HCC)    Hodgkin's Lymphoma   Depression    Hodgkin's disease(201)    Nausea and vomiting during pregnancy 01/15/2015   Sickle cell trait (HCC)    traits      Dispostion: I considered admission for this patient, but she currently does not meet inpatient criteria for admission she is safe for discharge outpatient  follow-up and strict return precautions     Final Clinical Impression(s) / ED Diagnoses Final diagnoses:  None     @PCDICTATION @    Glendora Score, MD 05/13/22 1155

## 2022-05-12 NOTE — ED Triage Notes (Signed)
Pt with abd pain and emesis since 0200 this morning, diarrhea today. C/o mid CP x 2 days. Pt states her brother and nephew have the flu.

## 2022-05-12 NOTE — ED Notes (Signed)
Patient was given a cup of water. Patient drank the cup with no complaints of nausea or vomiting.

## 2022-06-20 ENCOUNTER — Other Ambulatory Visit: Payer: Self-pay | Admitting: *Deleted

## 2022-06-20 DIAGNOSIS — R195 Other fecal abnormalities: Secondary | ICD-10-CM

## 2022-06-20 DIAGNOSIS — K644 Residual hemorrhoidal skin tags: Secondary | ICD-10-CM

## 2022-06-20 DIAGNOSIS — R194 Change in bowel habit: Secondary | ICD-10-CM

## 2022-06-24 ENCOUNTER — Encounter: Payer: Self-pay | Admitting: General Surgery

## 2022-06-24 ENCOUNTER — Ambulatory Visit (INDEPENDENT_AMBULATORY_CARE_PROVIDER_SITE_OTHER): Payer: Medicaid Other | Admitting: General Surgery

## 2022-06-24 VITALS — BP 117/81 | HR 88 | Temp 98.2°F | Resp 14 | Ht 59.0 in | Wt 91.0 lb

## 2022-06-24 DIAGNOSIS — K625 Hemorrhage of anus and rectum: Secondary | ICD-10-CM | POA: Diagnosis not present

## 2022-06-24 MED ORDER — SUTAB 1479-225-188 MG PO TABS: 1.0000 | ORAL_TABLET | Freq: Once | ORAL | 0 refills | Status: AC

## 2022-06-25 NOTE — Progress Notes (Signed)
Michele Alexander; [id number]; 03/11/1996   HPI Patient is a 27 year old white female who was referred to my care by Dr. Heinz Knuckles for evaluation and treatment of hemorrhoidal disease as well as blood per rectum.  Patient has had intermittent blood per rectum and rectal pain with bowel movements over the past year.  She describes pushing on her left lower quadrant to help her move her bowels.  She does experience rectal pain after bowel movements with radiation to the tailbone.  She also notices blood on the toilet paper when she wipes herself.  She does have a history of Hodgkin's lymphoma, though this is currently in patient. Past Medical History:  Diagnosis Date   Anemia    Anxiety    Blood transfusion without reported diagnosis    Cancer ([place])    Hodgkin's Lymphoma   Depression    Hodgkin's disease(201)    Nausea and vomiting during pregnancy 01/15/2015   Sickle cell trait ([place])    traits    Past Surgical History:  Procedure Laterality Date   DILATION AND EVACUATION N/A 12/21/2015   Procedure: DILATATION AND EVACUATION;  Surgeon: Will Bonnet, MD;  Location: [facility] ORS;  Service: Gynecology;  Laterality: N/A;   NECK LESION BIOPSY     OTHER SURGICAL HISTORY N/A 2015   Endoscopy   PORT-A-CATH REMOVAL     PORTACATH PLACEMENT Left 2010   TUBAL LIGATION N/A 12/06/2020   Procedure: POST PARTUM TUBAL LIGATION;  Surgeon: Homero Fellers, MD;  Location: [facility] ORS;  Service: Gynecology;  Laterality: N/A;    Family History  Problem Relation Age of Onset   Hypertension Mother    Diabetes Maternal Grandfather    Hypertension Maternal Grandfather    Cancer Maternal Grandfather    Cancer Maternal Grandmother    Asthma Sister    Asthma Daughter     Current Outpatient Medications on File Prior to Visit  Medication Sig Dispense Refill   ABILIFY 2 MG tablet Take by mouth.     citalopram (CELEXA) 20 MG tablet Take 1 tablet (20 mg total) by mouth daily. 30 tablet 2   colestipol  (COLESTID) 1 g tablet Take 2 g by mouth 2 (two) times daily.     gabapentin (NEURONTIN) 300 MG capsule Take 300 mg by mouth 2 (two) times daily.     lamoTRIgine (LAMICTAL) 25 MG tablet Take 200 mg by mouth daily.     mirtazapine (REMERON) 45 MG tablet Take 45 mg by mouth at bedtime.     QUEtiapine (SEROQUEL) 25 MG tablet Take 275 mg by mouth at bedtime.     No current facility-administered medications on file prior to visit.    Allergies  Allergen Reactions   Other Hives and Other (See Comments)    Oral CT contrast Oral CT contrast   Diatrizoate Hives    PO only.  Tolerates IV contrast.   Iodinated Contrast Media Hives   Nexium [Esomeprazole Magnesium] Swelling    Social History   Substance and Sexual Activity  Alcohol Use No    Social History   Tobacco Use  Smoking Status Never  Smokeless Tobacco Never    Review of Systems  Constitutional:  Positive for malaise/fatigue.  HENT: Negative.    Eyes: Negative.   Respiratory:  Positive for shortness of breath.   Cardiovascular: Negative.   Gastrointestinal:  Positive for abdominal pain.  Genitourinary: Negative.   Musculoskeletal:  Positive for back pain, joint pain and neck pain.  Skin: Negative.   Neurological:  Negative.   Endo/Heme/Allergies: Negative.   Psychiatric/Behavioral: Negative.      Objective   Vitals:   06/24/22 1103  BP: 117/81  Pulse: 88  Resp: 14  Temp: 98.2 F (36.8 C)  SpO2: 98%    Physical Exam Vitals reviewed. Exam conducted with a chaperone present.  Constitutional:      Appearance: Normal appearance. She is normal weight. She is not ill-appearing.  HENT:     Head: Normocephalic and atraumatic.  Cardiovascular:     Rate and Rhythm: Normal rate and regular rhythm.     Heart sounds: Normal heart sounds. No murmur heard.    No friction rub. No gallop.  Pulmonary:     Effort: Pulmonary effort is normal. No respiratory distress.     Breath sounds: Normal breath sounds. No stridor. No  wheezing, rhonchi or rales.  Abdominal:     General: Abdomen is flat. Bowel sounds are normal. There is no distension.     Palpations: Abdomen is soft. There is no mass.     Tenderness: There is no abdominal tenderness. There is no guarding or rebound.     Hernia: No hernia is present.  Genitourinary:    Comments: Rectal examination reveals a small internal hemorrhoid at the 10 o'clock position.  Sphincter tone normal.  No blood noted in rectal vault.  She did have pain to palpation along the posterior aspect of the anus.  No significant external hemorrhoidal disease noted at the present time. Skin:    General: Skin is warm and dry.  Neurological:     Mental Status: She is alert and oriented to person, place, and time.    Primary care notes reviewed Assessment  Blood per rectum Rectal pain with bowel movements. 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  In addition, she does have mild internal hemorrhoidal disease, though she has been having intermittent blood per rectum for some time now. Plan  Given her history of lymphoma and her ongoing intermittent blood per rectum, she will undergo a colonoscopy on 07/08/2022.  The risks and benefits of the procedure including bleeding and perforation were fully explained to the patient, who gave informed consent. Sutabs have been prescribed.

## 2022-06-25 NOTE — H&P (Signed)
Michele Alexander; [id number]; January 21, 1996   HPI Patient is a 27 year old white female who was referred to my care by Dr. Heinz Knuckles for evaluation and treatment of hemorrhoidal disease as well as blood per rectum.  Patient has had intermittent blood per rectum and rectal pain with bowel movements over the past year.  She describes pushing on her left lower quadrant to help her move her bowels.  She does experience rectal pain after bowel movements with radiation to the tailbone.  She also notices blood on the toilet paper when she wipes herself.  She does have a history of Hodgkin's lymphoma, though this is currently in patient. Past Medical History:  Diagnosis Date   Anemia    Anxiety    Blood transfusion without reported diagnosis    Cancer ([place])    Hodgkin's Lymphoma   Depression    Hodgkin's disease(201)    Nausea and vomiting during pregnancy 01/15/2015   Sickle cell trait ([place])    traits    Past Surgical History:  Procedure Laterality Date   DILATION AND EVACUATION N/A 12/21/2015   Procedure: DILATATION AND EVACUATION;  Surgeon: Will Bonnet, MD;  Location: [facility] ORS;  Service: Gynecology;  Laterality: N/A;   NECK LESION BIOPSY     OTHER SURGICAL HISTORY N/A 2015   Endoscopy   PORT-A-CATH REMOVAL     PORTACATH PLACEMENT Left 2010   TUBAL LIGATION N/A 12/06/2020   Procedure: POST PARTUM TUBAL LIGATION;  Surgeon: Homero Fellers, MD;  Location: [facility] ORS;  Service: Gynecology;  Laterality: N/A;    Family History  Problem Relation Age of Onset   Hypertension Mother    Diabetes Maternal Grandfather    Hypertension Maternal Grandfather    Cancer Maternal Grandfather    Cancer Maternal Grandmother    Asthma Sister    Asthma Daughter     Current Outpatient Medications on File Prior to Visit  Medication Sig Dispense Refill   ABILIFY 2 MG tablet Take by mouth.     citalopram (CELEXA) 20 MG tablet Take 1 tablet (20 mg total) by mouth daily. 30 tablet 2   colestipol  (COLESTID) 1 g tablet Take 2 g by mouth 2 (two) times daily.     gabapentin (NEURONTIN) 300 MG capsule Take 300 mg by mouth 2 (two) times daily.     lamoTRIgine (LAMICTAL) 25 MG tablet Take 200 mg by mouth daily.     mirtazapine (REMERON) 45 MG tablet Take 45 mg by mouth at bedtime.     QUEtiapine (SEROQUEL) 25 MG tablet Take 275 mg by mouth at bedtime.     No current facility-administered medications on file prior to visit.    Allergies  Allergen Reactions   Other Hives and Other (See Comments)    Oral CT contrast Oral CT contrast   Diatrizoate Hives    PO only.  Tolerates IV contrast.   Iodinated Contrast Media Hives   Nexium [Esomeprazole Magnesium] Swelling    Social History   Substance and Sexual Activity  Alcohol Use No    Social History   Tobacco Use  Smoking Status Never  Smokeless Tobacco Never    Review of Systems  Constitutional:  Positive for malaise/fatigue.  HENT: Negative.    Eyes: Negative.   Respiratory:  Positive for shortness of breath.   Cardiovascular: Negative.   Gastrointestinal:  Positive for abdominal pain.  Genitourinary: Negative.   Musculoskeletal:  Positive for back pain, joint pain and neck pain.  Skin: Negative.   Neurological:  Negative.   Endo/Heme/Allergies: Negative.   Psychiatric/Behavioral: Negative.      Objective   Vitals:   06/24/22 1103  BP: 117/81  Pulse: 88  Resp: 14  Temp: 98.2 F (36.8 C)  SpO2: 98%    Physical Exam Vitals reviewed. Exam conducted with a chaperone present.  Constitutional:      Appearance: Normal appearance. She is normal weight. She is not ill-appearing.  HENT:     Head: Normocephalic and atraumatic.  Cardiovascular:     Rate and Rhythm: Normal rate and regular rhythm.     Heart sounds: Normal heart sounds. No murmur heard.    No friction rub. No gallop.  Pulmonary:     Effort: Pulmonary effort is normal. No respiratory distress.     Breath sounds: Normal breath sounds. No stridor. No  wheezing, rhonchi or rales.  Abdominal:     General: Abdomen is flat. Bowel sounds are normal. There is no distension.     Palpations: Abdomen is soft. There is no mass.     Tenderness: There is no abdominal tenderness. There is no guarding or rebound.     Hernia: No hernia is present.  Genitourinary:    Comments: Rectal examination reveals a small internal hemorrhoid at the 10 o'clock position.  Sphincter tone normal.  No blood noted in rectal vault.  She did have pain to palpation along the posterior aspect of the anus.  No significant external hemorrhoidal disease noted at the present time. Skin:    General: Skin is warm and dry.  Neurological:     Mental Status: She is alert and oriented to person, place, and time.    Primary care notes reviewed Assessment  Blood per rectum Rectal pain with bowel movements. 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  In addition, she does have mild internal hemorrhoidal disease, though she has been having intermittent blood per rectum for some time now. Plan  Given her history of lymphoma and her ongoing intermittent blood per rectum, she will undergo a colonoscopy on 07/08/2022.  The risks and benefits of the procedure including bleeding and perforation were fully explained to the patient, who gave informed consent. Sutabs have been prescribed.

## 2022-07-02 ENCOUNTER — Other Ambulatory Visit: Payer: Self-pay | Admitting: *Deleted

## 2022-07-02 DIAGNOSIS — R194 Change in bowel habit: Secondary | ICD-10-CM

## 2022-07-03 ENCOUNTER — Other Ambulatory Visit: Payer: Self-pay | Admitting: Family Medicine

## 2022-07-03 DIAGNOSIS — R194 Change in bowel habit: Secondary | ICD-10-CM

## 2022-07-07 ENCOUNTER — Other Ambulatory Visit (HOSPITAL_COMMUNITY)
Admission: RE | Admit: 2022-07-07 | Discharge: 2022-07-07 | Disposition: A | Discharge status: Home / Self Care | Payer: Medicaid Other | Source: Ambulatory Visit | Attending: General Surgery | Admitting: General Surgery

## 2022-07-07 DIAGNOSIS — R194 Change in bowel habit: Secondary | ICD-10-CM | POA: Diagnosis present

## 2022-07-07 LAB — PREGNANCY, URINE: Preg Test, Ur: NEGATIVE

## 2022-07-08 ENCOUNTER — Other Ambulatory Visit: Payer: Self-pay

## 2022-07-08 ENCOUNTER — Encounter (HOSPITAL_COMMUNITY)
Admission: RE | Disposition: A | Discharge status: Home / Self Care | Payer: Self-pay | Source: Ambulatory Visit | Attending: General Surgery

## 2022-07-08 ENCOUNTER — Ambulatory Visit (HOSPITAL_BASED_OUTPATIENT_CLINIC_OR_DEPARTMENT_OTHER): Payer: Medicaid Other | Admitting: Anesthesiology

## 2022-07-08 ENCOUNTER — Ambulatory Visit (HOSPITAL_COMMUNITY)
Admission: RE | Admit: 2022-07-08 | Discharge: 2022-07-08 | Disposition: A | Discharge status: Home / Self Care | Payer: Medicaid Other | Source: Ambulatory Visit | Attending: General Surgery | Admitting: General Surgery

## 2022-07-08 ENCOUNTER — Encounter (HOSPITAL_COMMUNITY): Payer: Self-pay | Admitting: General Surgery

## 2022-07-08 ENCOUNTER — Ambulatory Visit (HOSPITAL_COMMUNITY): Payer: Medicaid Other | Admitting: Anesthesiology

## 2022-07-08 DIAGNOSIS — K625 Hemorrhage of anus and rectum: Secondary | ICD-10-CM

## 2022-07-08 DIAGNOSIS — Z8571 Personal history of Hodgkin lymphoma: Secondary | ICD-10-CM | POA: Insufficient documentation

## 2022-07-08 DIAGNOSIS — K648 Other hemorrhoids: Secondary | ICD-10-CM | POA: Diagnosis not present

## 2022-07-08 DIAGNOSIS — K6289 Other specified diseases of anus and rectum: Secondary | ICD-10-CM | POA: Diagnosis not present

## 2022-07-08 DIAGNOSIS — K5730 Diverticulosis of large intestine without perforation or abscess without bleeding: Secondary | ICD-10-CM | POA: Diagnosis not present

## 2022-07-08 DIAGNOSIS — F419 Anxiety disorder, unspecified: Secondary | ICD-10-CM | POA: Diagnosis not present

## 2022-07-08 DIAGNOSIS — R195 Other fecal abnormalities: Secondary | ICD-10-CM

## 2022-07-08 DIAGNOSIS — F17200 Nicotine dependence, unspecified, uncomplicated: Secondary | ICD-10-CM | POA: Diagnosis not present

## 2022-07-08 DIAGNOSIS — F32A Depression, unspecified: Secondary | ICD-10-CM | POA: Diagnosis not present

## 2022-07-08 HISTORY — PX: COLONOSCOPY WITH PROPOFOL: SHX5780

## 2022-07-08 SURGERY — COLONOSCOPY WITH PROPOFOL
Anesthesia: General

## 2022-07-08 MED ORDER — PROPOFOL 10 MG/ML IV BOLUS
INTRAVENOUS | Status: DC | PRN
  Administered 2022-07-08: 40 mg via INTRAVENOUS
  Administered 2022-07-08: 50 mg via INTRAVENOUS
  Administered 2022-07-08: 60 mg via INTRAVENOUS
  Administered 2022-07-08: 50 mg via INTRAVENOUS

## 2022-07-08 MED ORDER — LACTATED RINGERS IV SOLN: INTRAVENOUS | Status: DC

## 2022-07-08 MED ORDER — SODIUM CHLORIDE 0.9 % IV SOLN: INTRAVENOUS | Status: DC

## 2022-07-08 MED ORDER — LIDOCAINE HCL (CARDIAC) PF 100 MG/5ML IV SOSY
PREFILLED_SYRINGE | INTRAVENOUS | Status: DC | PRN
  Administered 2022-07-08: 50 mg via INTRATRACHEAL

## 2022-07-08 NOTE — Anesthesia Postprocedure Evaluation (Signed)
Anesthesia Post Note  Patient: Michele Alexander  Procedure(s) Performed: COLONOSCOPY WITH PROPOFOL  Patient location during evaluation: Phase II Anesthesia Type: General Level of consciousness: awake and alert and oriented Pain management: pain level controlled Vital Signs Assessment: post-procedure vital signs reviewed and stable Respiratory status: spontaneous breathing, nonlabored ventilation and respiratory function stable Cardiovascular status: blood pressure returned to baseline and stable Postop Assessment: no apparent nausea or vomiting Anesthetic complications: no  No notable events documented.   Last Vitals:  Vitals:   07/08/22 0645 07/08/22 0742  BP: 112/81 (!) 94/54  Pulse: 85 73  Resp: (!) 22 19  Temp: 36.7 C 36.5 C  SpO2: 100% 100%    Last Pain:  Vitals:   07/08/22 0742  TempSrc: Oral  PainSc: 0-No pain                 Genora Arp C Rhian Funari

## 2022-07-08 NOTE — Anesthesia Preprocedure Evaluation (Addendum)
Anesthesia Evaluation  Patient identified by MRN, date of birth, ID band Patient awake    Reviewed: Allergy & Precautions, H&P , NPO status , Patient's Chart, lab work & pertinent test results  Airway Mallampati: I  TM Distance: >3 FB Neck ROM: Full    Dental  (+) Dental Advisory Given, Chipped,    Pulmonary Current Smoker and Patient abstained from smoking.   Pulmonary exam normal breath sounds clear to auscultation       Cardiovascular negative cardio ROS Normal cardiovascular exam Rhythm:Regular Rate:Normal     Neuro/Psych  PSYCHIATRIC DISORDERS Anxiety Depression    negative neurological ROS     GI/Hepatic negative GI ROS, Neg liver ROS,,,  Endo/Other  negative endocrine ROS    Renal/GU negative Renal ROS  negative genitourinary   Musculoskeletal negative musculoskeletal ROS (+)    Abdominal   Peds negative pediatric ROS (+)  Hematology  (+) Blood dyscrasia, anemia   Anesthesia Other Findings   Reproductive/Obstetrics negative OB ROS                             Anesthesia Physical Anesthesia Plan  ASA: 2  Anesthesia Plan: General   Post-op Pain Management: Minimal or no pain anticipated   Induction: Intravenous  PONV Risk Score and Plan: Propofol infusion  Airway Management Planned: Nasal Cannula and Natural Airway  Additional Equipment:   Intra-op Plan:   Post-operative Plan:   Informed Consent: I have reviewed the patients History and Physical, chart, labs and discussed the procedure including the risks, benefits and alternatives for the proposed anesthesia with the patient or authorized representative who has indicated his/her understanding and acceptance.     Dental advisory given  Plan Discussed with: CRNA and Surgeon  Anesthesia Plan Comments:        Anesthesia Quick Evaluation

## 2022-07-08 NOTE — Interval H&P Note (Signed)
History and Physical Interval Note:  07/08/2022 7:16 AM  Michele Alexander  has presented today for surgery, with the diagnosis of BLOOD PER RECTUM POSITIVE HEMOCCULT.  The various methods of treatment have been discussed with the patient and family. After consideration of risks, benefits and other options for treatment, the patient has consented to  Procedure(s): COLONOSCOPY WITH PROPOFOL (N/A) as a surgical intervention.  The patient's history has been reviewed, patient examined, no change in status, stable for surgery.  I have reviewed the patient's chart and labs.  Questions were answered to the patient's satisfaction.     Aviva Signs

## 2022-07-08 NOTE — Transfer of Care (Signed)
Immediate Anesthesia Transfer of Care Note  Patient: Michele Alexander  Procedure(s) Performed: COLONOSCOPY WITH PROPOFOL  Patient Location: Endoscopy Unit  Anesthesia Type:General  Level of Consciousness: awake, alert , oriented, and patient cooperative  Airway & Oxygen Therapy: Patient Spontanous Breathing  Post-op Assessment: Report given to RN, Post -op Vital signs reviewed and stable, and Patient moving all extremities  Post vital signs: Reviewed and stable  Last Vitals:  Vitals Value Taken Time  BP    Temp    Pulse    Resp    SpO2      Last Pain:  Vitals:   07/08/22 0726  TempSrc:   PainSc: 0-No pain      Patients Stated Pain Goal: 10 (93/81/82 9937)  Complications: No notable events documented.

## 2022-07-08 NOTE — Op Note (Signed)
[facility] Patient Name: Michele Alexander Procedure Date: 07/08/2022 7:13 AM MRN: [id number] Date of Birth: [birth year] Attending MD: Aviva Signs , MD, [id number] CSN: [id number] Age: 27 Admit Type: Outpatient Procedure:                Colonoscopy Indications:              Rectal bleeding Providers:                Aviva Signs, MD, Lambert Mody, Illene Labrador Referring MD:              Medicines:                Propofol per Anesthesia Complications:            No immediate complications. Estimated Blood Loss:     Estimated blood loss: none. Procedure:                Pre-Anesthesia Assessment:                           - Prior to the procedure, a History and Physical                            was performed, and patient medications and                            allergies were reviewed. The patient is competent.                            The risks and benefits of the procedure and the                            sedation options and risks were discussed with the                            patient. All questions were answered and informed                            consent was obtained. Patient identification and                            proposed procedure were verified by the physician,                            the nurse, the anesthetist and the technician in                            the procedure room. Mental Status Examination:                            alert and oriented. Airway Examination: normal                            oropharyngeal airway and neck mobility.  Respiratory                            Examination: clear to auscultation. CV Examination:                            RRR, no murmurs, no S3 or S4. Prophylactic                            Antibiotics: The patient does not require                            prophylactic antibiotics. Prior Anticoagulants: The                            patient has taken no anticoagulant or  antiplatelet                            agents. ASA Grade Assessment: I - A normal, healthy                            patient. After reviewing the risks and benefits,                            the patient was deemed in satisfactory condition to                            undergo the procedure. The anesthesia plan was to                            use monitored anesthesia care (MAC). Immediately                            prior to administration of medications, the patient                            was re-assessed for adequacy to receive sedatives.                            The heart rate, respiratory rate, oxygen                            saturations, blood pressure, adequacy of pulmonary                            ventilation, and response to care were monitored                            throughout the procedure. The physical status of                            the patient was re-assessed after the procedure.  After obtaining informed consent, the colonoscope                            was passed under direct vision. Throughout the                            procedure, the patient's blood pressure, pulse, and                            oxygen saturations were monitored continuously. The                            [id number]) scope was introduced through the                            anus and advanced to the the cecum, identified by                            the appendiceal orifice, ileocecal valve and                            palpation. No anatomical landmarks were                            photographed. The entire colon was well visualized.                            The colonoscopy was performed without difficulty.                            The patient tolerated the procedure well. The                            quality of the bowel preparation was adequate. The                            total duration of the procedure was 10 minutes. Scope  In: 7:27:24 AM Scope Out: 7:37:55 AM Scope Withdrawal Time: 0 hours 5 minutes 39 seconds  Total Procedure Duration: 0 hours 10 minutes 31 seconds  Findings:      The perianal and digital rectal examinations were normal. Pertinent       negatives include normal sphincter tone.      A few small-mouthed diverticula were found in the mid ascending colon.       Estimated blood loss: none.      The exam was otherwise without abnormality. Impression:               - Diverticulosis in the mid ascending colon.                           - The examination was otherwise normal.                           - No specimens collected. Moderate Sedation:      Moderate (conscious) sedation was personally  administered by an       anesthesia professional. [Parameters Monitored]. [Sedation Duration       Time]. Recommendation:           - Written discharge instructions were provided to                            the patient.                           - The signs and symptoms of potential delayed                            complications were discussed with the patient.                           - Patient has a contact number available for                            emergencies.                           - Return to normal activities tomorrow.                           - Resume previous diet. Procedure Code(s):        --- Professional ---                           [phone], Colonoscopy, flexible; diagnostic, including                            collection of specimen(s) by brushing or washing,                            when performed (separate procedure) Diagnosis Code(s):        --- Professional ---                           K62.5, Hemorrhage of anus and rectum                           K57.30, Diverticulosis of large intestine without                            perforation or abscess without bleeding CPT copyright 2022 American [employer] Association. All rights reserved. The codes documented in this report  are preliminary and upon coder review may  be revised to meet current compliance requirements. Aviva Signs, MD Aviva Signs, MD 07/08/2022 7:44:04 AM This report has been signed electronically. Number of Addenda: 0

## 2022-07-14 ENCOUNTER — Encounter (HOSPITAL_COMMUNITY): Payer: Self-pay | Admitting: General Surgery

## 2023-05-10 ENCOUNTER — Ambulatory Visit: Payer: Medicaid Other

## 2023-05-10 ENCOUNTER — Other Ambulatory Visit: Payer: Self-pay

## 2023-05-10 ENCOUNTER — Encounter: Payer: Self-pay | Admitting: Emergency Medicine

## 2023-05-10 ENCOUNTER — Ambulatory Visit
Admission: EM | Admit: 2023-05-10 | Discharge: 2023-05-10 | Disposition: A | Discharge status: Home / Self Care | Payer: Medicaid Other | Attending: Family Medicine | Admitting: Family Medicine

## 2023-05-10 DIAGNOSIS — M79641 Pain in right hand: Secondary | ICD-10-CM

## 2023-05-10 NOTE — Discharge Instructions (Signed)
We will call if anything comes back abnormal on your hand x-ray.  Ice, ibuprofen and Tylenol, rest

## 2023-05-10 NOTE — ED Triage Notes (Addendum)
Pt reports right hand pain since punching car window on Thursday. Pt reports pain in fourth and fifth digit.

## 2023-05-14 NOTE — ED Provider Notes (Signed)
[facility]    CSN: [id number] Arrival date & time: 05/10/23  1431      History   Chief Complaint Chief Complaint  Patient presents with   Hand Pain    HPI Michele Alexander is a 27 y.o. female.   Pt reports right hand pain since punching car window on Thursday. Pt reports pain in fourth and fifth digit.       Past Medical History:  Diagnosis Date   Anemia    Anxiety    Blood transfusion without reported diagnosis    Cancer (HCC)    Hodgkin's Lymphoma   Depression    Hodgkin's disease(201)    Nausea and vomiting during pregnancy 01/15/2015   Sickle cell trait (HCC)    traits    Patient Active Problem List   Diagnosis Date Noted   Blood per rectum 07/08/2022   Encounter for sterilization    Encounter for elective induction of labor 12/05/2020   Vaginal delivery    Postpartum care following vaginal delivery    Normal labor and delivery 11/26/2020   Anxiety 08/30/2020   History of postpartum depression, currently pregnant 02/28/2019   Supervision of normal pregnancy 08/31/2018   History of Hodgkin's lymphoma 04/23/2015   Sickle cell trait (HCC) 04/23/2015    Past Surgical History:  Procedure Laterality Date   COLONOSCOPY WITH PROPOFOL N/A 07/08/2022   Procedure: COLONOSCOPY WITH PROPOFOL;  Surgeon: Franky Macho, MD;  Location: [facility];  Service: Gastroenterology;  Laterality: N/A;   DILATION AND EVACUATION N/A 12/21/2015   Procedure: DILATATION AND EVACUATION;  Surgeon: Conard Novak, MD;  Location: [facility];  Service: Gynecology;  Laterality: N/A;   NECK LESION BIOPSY     OTHER SURGICAL HISTORY N/A 2015   Endoscopy   PORT-A-CATH REMOVAL     PORTACATH PLACEMENT Left 2010   TUBAL LIGATION N/A 12/06/2020   Procedure: POST PARTUM TUBAL LIGATION;  Surgeon: Natale Milch, MD;  Location: [facility] OR[facility];  Service: Gynecology;  Laterality: N/A;    OB History     Gravida  8   Para  3   Term  3   Preterm  0   AB  5   Living  3       SAB  5   IAB  0   Ectopic  0   Multiple  0   Live Births  3        Obstetric Comments  Pt reports 12 miscarriages, 2 term deliveries, and current pregnancy          Home Medications    Prior to Admission medications   Medication Sig Start Date End Date Taking? Authorizing Provider  ABILIFY 2 MG tablet Take 2 mg by mouth at bedtime. 04/23/21   [provider]  acetaminophen (TYLENOL) 500 MG tablet Take 500-1,000 mg by mouth every 6 (six) hours as needed (pain.).    [provider]  colestipol (COLESTID) 1 g tablet Take 2 g by mouth in the morning. 03/14/22   [provider]  gabapentin (NEURONTIN) 300 MG capsule Take 300 mg by mouth at bedtime.    [provider]  lamoTRIgine (LAMICTAL) 200 MG tablet Take 200 mg by mouth at bedtime.    [provider]  mirtazapine (REMERON) 45 MG tablet Take 45 mg by mouth at bedtime. 12/31/21 07/04/23  [provider]  QUEtiapine (SEROQUEL) 50 MG tablet Take 75 mg by mouth at bedtime.    [provider]  Family History Family History  Problem Relation Age of Onset   Hypertension Mother    Diabetes Maternal Grandfather    Hypertension Maternal Grandfather    Cancer Maternal Grandfather    Cancer Maternal Grandmother    Asthma Sister    Asthma Daughter     Social History Social History   Tobacco Use   Smoking status: Every Day    Types: Cigarettes   Smokeless tobacco: Current  Vaping Use   Vaping status: Never Used  Substance Use Topics   Alcohol use: No   Drug use: No     Allergies   Other, Diatrizoate, Iodinated contrast media, and Nexium [esomeprazole magnesium]   Review of Systems Review of Systems PER HPI  Physical Exam Triage Vital Signs ED Triage Vitals  Encounter Vitals Group     BP 05/10/23 1519 117/81     Systolic BP Percentile --      Diastolic BP Percentile --      Pulse Rate 05/10/23 1519 100     Resp 05/10/23 1519 20     Temp  05/10/23 1519 99 F (37.2 C)     Temp Source 05/10/23 1519 Oral     SpO2 05/10/23 1519 99 %     Weight --      Height --      Head Circumference --      Peak Flow --      Pain Score 05/10/23 1521 8     Pain Loc --      Pain Education --      Exclude from Growth Chart --    No data found.  Updated Vital Signs BP 117/81 (BP Location: Right Arm)   Pulse 100   Temp 99 F (37.2 C) (Oral)   Resp 20   LMP 04/23/2023 (Approximate)   SpO2 99%   Breastfeeding No   Visual Acuity Right Eye Distance:   Left Eye Distance:   Bilateral Distance:    Right Eye Near:   Left Eye Near:    Bilateral Near:     Physical Exam Vitals and nursing note reviewed.  Constitutional:      Appearance: Normal appearance. She is not ill-appearing.  HENT:     Head: Atraumatic.  Eyes:     Extraocular Movements: Extraocular movements intact.     Conjunctiva/sclera: Conjunctivae normal.  Cardiovascular:     Rate and Rhythm: Normal rate and regular rhythm.     Heart sounds: Normal heart sounds.  Pulmonary:     Effort: Pulmonary effort is normal.     Breath sounds: Normal breath sounds.  Musculoskeletal:        General: Swelling, tenderness and signs of injury present. No deformity. Normal range of motion.     Cervical back: Normal range of motion and neck supple.     Comments: Point tender over right 4th and 5th mcp  Skin:    General: Skin is warm and dry.     Findings: No bruising or erythema.  Neurological:     Mental Status: She is alert and oriented to person, place, and time.     Comments: Right hand neurovascularly intact  Psychiatric:        Mood and Affect: Mood normal.        Thought Content: Thought content normal.        Judgment: Judgment normal.    UC Treatments / Results  Labs (all labs ordered are listed, but only abnormal results are displayed) Labs Reviewed -  No data to display  EKG  Radiology No results found.  Procedures Procedures (including critical care  time)  Medications Ordered in UC Medications - No data to display  Initial Impression / Assessment and Plan / UC Course  I have reviewed the triage vital signs and the nursing notes.  Pertinent labs & imaging results that were available during my care of the patient were reviewed by me and considered in my medical decision making (see chart for details).     X-ray negative for acute bony abnormality, discussed RICE protocol, OTC pain relievers and home care. Return for worsening sxs.   Final Clinical Impressions(s) / UC Diagnoses   Final diagnoses:  Right hand pain     Discharge Instructions      We will call if anything comes back abnormal on your hand x-ray.  Ice, ibuprofen and Tylenol, rest    ED Prescriptions   None    PDMP not reviewed this encounter.   Particia Nearing, [place] 05/14/23 1932

## 2023-10-16 ENCOUNTER — Encounter (HOSPITAL_COMMUNITY): Payer: Self-pay | Admitting: *Deleted

## 2023-10-16 ENCOUNTER — Other Ambulatory Visit: Payer: Self-pay

## 2023-10-16 ENCOUNTER — Emergency Department (HOSPITAL_COMMUNITY)
Admission: EM | Admit: 2023-10-16 | Discharge: 2023-10-16 | Disposition: A | Discharge status: Home / Self Care | Attending: Emergency Medicine | Admitting: Emergency Medicine

## 2023-10-16 DIAGNOSIS — L02215 Cutaneous abscess of perineum: Secondary | ICD-10-CM | POA: Insufficient documentation

## 2023-10-16 DIAGNOSIS — R519 Headache, unspecified: Secondary | ICD-10-CM | POA: Diagnosis not present

## 2023-10-16 DIAGNOSIS — L731 Pseudofolliculitis barbae: Secondary | ICD-10-CM

## 2023-10-16 DIAGNOSIS — L0231 Cutaneous abscess of buttock: Secondary | ICD-10-CM

## 2023-10-16 LAB — PREGNANCY, URINE: Preg Test, Ur: NEGATIVE

## 2023-10-16 MED ORDER — ONDANSETRON 8 MG PO TBDP
8.0000 mg | ORAL_TABLET | Freq: Once | ORAL | Status: AC
  Administered 2023-10-16: 8 mg via ORAL
  Filled 2023-10-16: qty 1

## 2023-10-16 MED ORDER — DOXYCYCLINE HYCLATE 100 MG PO CAPS: 100.0000 mg | ORAL_CAPSULE | Freq: Two times a day (BID) | ORAL | 0 refills | Status: AC

## 2023-10-16 MED ORDER — ACETAMINOPHEN 500 MG PO TABS
1000.0000 mg | ORAL_TABLET | Freq: Once | ORAL | Status: AC
  Administered 2023-10-16: 1000 mg via ORAL
  Filled 2023-10-16: qty 2

## 2023-10-16 MED ORDER — LIDOCAINE-EPINEPHRINE (PF) 2 %-1:200000 IJ SOLN
10.0000 mL | Freq: Once | INTRAMUSCULAR | Status: AC
  Administered 2023-10-16: 10 mL via INTRADERMAL
  Filled 2023-10-16: qty 20

## 2023-10-16 NOTE — Discharge Instructions (Addendum)
 It was a pleasure taking care of you today.  Today we evaluated your abscess.  Based on your history, physical exam, and testing today, it was decided that an incision and drainage would be appropriate.  This procedure was successful without complication.  Today you will be prescribed an antibiotic, please take the antibiotic as prescribed and for the full course.  Please make a follow-up appointment with your primary care provider for 3 to 5 days for ongoing diagnosis and treatment.  Please return to the emergency department or seek further medical care if you experience any of the following symptoms including but not limited to fever, chills, severe pain, confusion.

## 2023-10-16 NOTE — ED Provider Notes (Signed)
 [place] EMERGENCY DEPARTMENT AT [facility] Provider Note   CSN: [id number] Arrival date & time: 10/16/23  1700     History  Chief Complaint  Patient presents with   Abscess    Michele Alexander is a 28 y.o. female who presents to the emergency department with a chief complaint of abscess.  Patient states that she has 2 abscesses, one is in her right groin region which has been present for the past 3 days, and then she has 1 in between her vagina and rectum which has been present for approximately 1 week.  Patient also complaining of headache as well as nausea.  Patient states that she recently saw eye doctor 1 week ago for a UTI as well as a yeast infection, patient was prescribed ciprofloxacin as well as fluconazole and Tinidazole. Patient states she has finished her antibiotic and antifungal course.  Patient denies fever, denies chills.  States she has used warm compresses at home without much success.  Patient states that she does shave the area where the abscesses have occurred.  Denies known open wound, insect bite, or other preceding factor before abscesses developed.  Denies IV drug use.   Abscess Associated symptoms: headaches and nausea   Associated symptoms: no fever and no vomiting        Home Medications Prior to Admission medications   Medication Sig Start Date End Date Taking? Authorizing Provider  ABILIFY 2 MG tablet Take 2 mg by mouth at bedtime. 04/23/21   [provider]  acetaminophen  (TYLENOL ) 500 MG tablet Take 500-1,000 mg by mouth every 6 (six) hours as needed (pain.).    [provider]  ciprofloxacin (CIPRO) 250 MG tablet Take 250 mg by mouth 2 (two) times daily. 10/12/23   [provider]  colestipol (COLESTID) 1 g tablet Take 2 g by mouth in the morning. 03/14/22   [provider]  fluconazole (DIFLUCAN) 150 MG tablet Take 150 mg by mouth once. 10/07/23   [provider]  gabapentin (NEURONTIN) 300 MG  capsule Take 300 mg by mouth at bedtime.    [provider]  lamoTRIgine (LAMICTAL) 200 MG tablet Take 200 mg by mouth at bedtime.    [provider]  mirtazapine (REMERON) 45 MG tablet Take 45 mg by mouth at bedtime. 12/31/21 07/04/23  [provider]  QUEtiapine (SEROQUEL) 50 MG tablet Take 75 mg by mouth at bedtime.    [provider]      Allergies    Diatrizoate, Nexium [esomeprazole magnesium ], and Other    Review of Systems   Review of Systems  Constitutional:  Negative for chills and fever.  Respiratory:  Negative for chest tightness and shortness of breath.   Cardiovascular:  Negative for chest pain.  Gastrointestinal:  Positive for nausea. Negative for abdominal pain, diarrhea and vomiting.  Skin:  Negative for rash and wound.  Neurological:  Positive for headaches. Negative for weakness.  Psychiatric/Behavioral:  Negative for confusion.     Physical Exam Updated Vital Signs BP 123/84 (BP Location: Right Arm)   Pulse (!) 108   Temp 98 F (36.7 C) (Oral)   Resp 16   Ht 4\' 11"  (1.499 m)   Wt 40.8 kg   LMP 09/20/2023   SpO2 100%   BMI 18.16 kg/m  Physical Exam Vitals and nursing note reviewed. Exam conducted with a chaperone present.  Constitutional:      General: She is awake. She is not in acute distress.  Appearance: Normal appearance. She is not ill-appearing or toxic-appearing.  HENT:     Head: Normocephalic and atraumatic.  Eyes:     Extraocular Movements: Extraocular movements intact.  Cardiovascular:     Rate and Rhythm: Normal rate and regular rhythm.  Pulmonary:     Effort: Pulmonary effort is normal. No respiratory distress.     Breath sounds: Normal breath sounds. No wheezing, rhonchi or rales.  Genitourinary:    Pubic Area: No rash.        Comments: Small abscess between vagina and rectum near R glute, area surrounding is erythematous, not actively draining, warm to touch  On front pubic area patient has two  areas with ingrown hairs, no sign of abscess. Area is tender to touch and erythematous.  Skin:    General: Skin is warm and dry.     Capillary Refill: Capillary refill takes less than 2 seconds.  Neurological:     General: No focal deficit present.     Mental Status: She is alert and oriented to person, place, and time.     Cranial Nerves: No cranial nerve deficit.     Sensory: No sensory deficit.     Motor: No weakness.     Coordination: Coordination normal.  Psychiatric:        Mood and Affect: Mood normal.        Behavior: Behavior normal. Behavior is cooperative.     ED Results / Procedures / Treatments   Labs (all labs ordered are listed, but only abnormal results are displayed) Labs Reviewed  PREGNANCY, URINE    EKG None  Radiology No results found.  Procedures .Incision and Drainage  Date/Time: 10/16/2023 8:36 PM  Performed by: Fonda Hymen, PA-C Authorized by: Fonda Hymen, PA-C   Consent:    Consent obtained:  Verbal   Consent given by:  Patient   Risks, benefits, and alternatives were discussed: yes     Risks discussed:  Bleeding, incomplete drainage, pain and infection   Alternatives discussed:  No treatment, delayed treatment and observation Universal protocol:    Procedure explained and questions answered to patient or proxy's satisfaction: yes     Patient identity confirmed:  Verbally with patient and arm band Location:    Type:  Abscess   Location:  Anogenital   Anogenital location:  Perineum Pre-procedure details:    Skin preparation:  Povidone-iodine  Sedation:    Sedation type:  None Anesthesia:    Anesthesia method:  Local infiltration   Local anesthetic:  Lidocaine  2% WITH epi Procedure type:    Complexity:  Simple Procedure details:    Ultrasound guidance: no     Incision types:  Single straight   Wound management:  Probed and deloculated   Drainage:  Bloody and purulent   Wound treatment:  Wound left open   Packing  materials:  None Post-procedure details:    Procedure completion:  Tolerated well, no immediate complications     Medications Ordered in ED Medications  acetaminophen  (TYLENOL ) tablet 1,000 mg (1,000 mg Oral Given 10/16/23 1933)  ondansetron  (ZOFRAN -ODT) disintegrating tablet 8 mg (8 mg Oral Given 10/16/23 1933)  lidocaine -EPINEPHrine  (XYLOCAINE  W/EPI) 2 %-1:200000 (PF) injection 10 mL (10 mLs Intradermal Given 10/16/23 1950)    ED Course/ Medical Decision Making/ A&P    Patient presents to the ED for concern of abscess, this involves an extensive number of treatment options, and is a complaint that carries with it a high risk of complications and morbidity.  The differential diagnosis includes abscess, ingrown hair, cyst, lipoma, etc.   Co morbidities that complicate the patient evaluation  None   Lab Tests:  I Ordered, and personally interpreted labs.  The pertinent results include: Urine pregnancy test negative   Medicines ordered and prescription drug management:  I ordered medication including doxycycline  for abscess, infection Reevaluation of the patient after these medicines showed that the patient stayed the same I have reviewed the patients home medicines and have made adjustments as needed   Test Considered:  CBC, imaging -declined at this time, patient denies fever, chills, systemic symptoms.  Patient vital stable at time of exam.  Based on exam today abscess is superficial, no need for imaging at this time.   Critical Interventions:  None   Problem List / ED Course:  Abscess  28 year old female patient presents to the emergency department with chief complaint of abscess, patient states that she has had an abscess in her groin area as well as in between her vagina and rectum.  Patient states the abscess between her vagina and rectum has been there longer.  Patient recently treated for UTI as well as yeast infection, states she has finished this course of  antibiotic and antifungals. No abscess present on front groin area, just two areas of ingrown hair present, no need for I+D to these areas Patient also complaining of headache and nausea today Tylenol  and Zofran  given Urine pregnancy test negative Incision and drainage performed for abscess between vagina and rectum.  Procedure successful without complication.  Patient prescribed outpatient doxycycline  for infection.  Patient instructed to complete the full course of antibiotics and to take as prescribed, educated on the side effects of doxycycline  including sun sensitivity. Patient discharged, instructed follow-up with her primary care physician in 3 to 5 days for ongoing diagnosis and treatment. Return instructions given, educated that the most likely cause of abscess based on history and physical exam today is an ingrown hair, educated about using warm compresses, shaving with the hair grain, etc.    Reevaluation:  After the interventions noted above, I reevaluated the patient and found that they have :improved   Social Determinants of Health:  None   Dispostion:  After consideration of the diagnostic results and the patients response to treatment, I feel that the patent would benefit from discharge and follow-up with primary care provider. Outpatient antibiotic therapy prescribed.   Click here for ABCD2, HEART and other calculatorsREFRESH Note before signing :1}                              Medical Decision Making Amount and/or Complexity of Data Reviewed Labs: ordered.  Risk OTC drugs. Prescription drug management.         Final Clinical Impression(s) / ED Diagnoses Final diagnoses:  None    Rx / DC Orders ED Discharge Orders     None         Susanne Epps 10/16/23 2101    Dorenda Gandy, MD 10/16/23 2224

## 2023-10-16 NOTE — ED Triage Notes (Signed)
 Pt with two abscesses-one to buttocks x 1 week  and right groin x 3 days.  + HA with nausea. Pt see doctor a week ago for UTI and yeast infection. Pt has finished the antibiotics. Denies any fever or chills. Has tried warm compresses.

## 2024-04-12 ENCOUNTER — Encounter (HOSPITAL_COMMUNITY): Payer: Self-pay | Admitting: *Deleted

## 2024-04-12 ENCOUNTER — Emergency Department (HOSPITAL_COMMUNITY)

## 2024-04-12 ENCOUNTER — Emergency Department (HOSPITAL_COMMUNITY)
Admission: EM | Admit: 2024-04-12 | Discharge: 2024-04-12 | Disposition: A | Discharge status: Home / Self Care | Attending: Emergency Medicine | Admitting: Emergency Medicine

## 2024-04-12 ENCOUNTER — Other Ambulatory Visit: Payer: Self-pay

## 2024-04-12 DIAGNOSIS — R1032 Left lower quadrant pain: Secondary | ICD-10-CM | POA: Diagnosis present

## 2024-04-12 DIAGNOSIS — K5900 Constipation, unspecified: Secondary | ICD-10-CM | POA: Insufficient documentation

## 2024-04-12 DIAGNOSIS — R102 Pelvic and perineal pain unspecified side: Secondary | ICD-10-CM

## 2024-04-12 LAB — CBC WITH DIFFERENTIAL/PLATELET
Abs Immature Granulocytes: 0.01 K/uL (ref 0.00–0.07)
Basophils Absolute: 0.1 K/uL (ref 0.0–0.1)
Basophils Relative: 1 %
Eosinophils Absolute: 0.1 K/uL (ref 0.0–0.5)
Eosinophils Relative: 1 %
HCT: 32.4 % — ABNORMAL LOW (ref 36.0–46.0)
Hemoglobin: 9.5 g/dL — ABNORMAL LOW (ref 12.0–15.0)
Immature Granulocytes: 0 %
Lymphocytes Relative: 30 %
Lymphs Abs: 1.3 K/uL (ref 0.7–4.0)
MCH: 19 pg — ABNORMAL LOW (ref 26.0–34.0)
MCHC: 29.3 g/dL — ABNORMAL LOW (ref 30.0–36.0)
MCV: 64.8 fL — ABNORMAL LOW (ref 80.0–100.0)
Monocytes Absolute: 0.3 K/uL (ref 0.1–1.0)
Monocytes Relative: 7 %
Neutro Abs: 2.7 K/uL (ref 1.7–7.7)
Neutrophils Relative %: 61 %
Platelets: 440 K/uL — ABNORMAL HIGH (ref 150–400)
RBC: 5 MIL/uL (ref 3.87–5.11)
RDW: 17.8 % — ABNORMAL HIGH (ref 11.5–15.5)
Smear Review: NORMAL
WBC: 4.4 K/uL (ref 4.0–10.5)
nRBC: 0 % (ref 0.0–0.2)

## 2024-04-12 LAB — URINALYSIS, ROUTINE W REFLEX MICROSCOPIC
Bilirubin Urine: NEGATIVE
Glucose, UA: NEGATIVE mg/dL
Hgb urine dipstick: NEGATIVE
Ketones, ur: NEGATIVE mg/dL
Leukocytes,Ua: NEGATIVE
Nitrite: NEGATIVE
Protein, ur: NEGATIVE mg/dL
Specific Gravity, Urine: 1.021 (ref 1.005–1.030)
pH: 7 (ref 5.0–8.0)

## 2024-04-12 LAB — BASIC METABOLIC PANEL WITH GFR
Anion gap: 11 (ref 5–15)
BUN: 8 mg/dL (ref 6–20)
CO2: 25 mmol/L (ref 22–32)
Calcium: 9 mg/dL (ref 8.9–10.3)
Chloride: 104 mmol/L (ref 98–111)
Creatinine, Ser: 0.65 mg/dL (ref 0.44–1.00)
GFR, Estimated: >60 mL/min (ref >60)
Glucose, Bld: 84 mg/dL (ref 70–99)
Potassium: 3.6 mmol/L (ref 3.5–5.1)
Sodium: 140 mmol/L (ref 135–145)

## 2024-04-12 LAB — HCG, QUANTITATIVE, PREGNANCY: hCG, Beta Chain, Quant, S: <1 m[IU]/mL (ref <5)

## 2024-04-12 MED ORDER — IOHEXOL 300 MG/ML  SOLN
80.0000 mL | Freq: Once | INTRAMUSCULAR | Status: AC | PRN
  Administered 2024-04-12: 80 mL via INTRAVENOUS

## 2024-04-12 NOTE — Discharge Instructions (Signed)
 Your CT this evening was overall reassuring, it did show that you have some constipation as we discussed.  I recommend that you increase your water intake and natural fiber from fruits and vegetables.  Your pregnancy test was negative.  Please follow-up with your OB/GYN for recheck.  Return to the emergency department if you develop any new or worsening symptoms

## 2024-04-12 NOTE — ED Triage Notes (Signed)
 Pt with LLQ pain and is 2 days left on her menses.  Pt states she has had a tubal ligation and has taken a home pregnancy test which was negative per pt. + nausea  pt would like an US .

## 2024-04-14 NOTE — ED Provider Notes (Signed)
 [place] EMERGENCY DEPARTMENT AT [facility] Provider Note   CSN: [id number] Arrival date & time: 04/12/24  1621     Patient presents with: Abdominal Pain   Michele Alexander is a 28 y.o. female.    Abdominal Pain Associated symptoms: no chills, no dysuria, no fever, no nausea, no vaginal bleeding, no vaginal discharge and no vomiting        Michele Alexander is a 28 y.o. female with past medical history of Hodgkin's lymphoma sickle cell trait, and anxiety who presents to the Emergency Department requesting evaluation for possible tubal pregnancy.  She has history of bilateral tubal ligation 3 years ago.  States she is 2 days late on her menstrual cycle.  Concerned that she has a tubal pregnancy because someone she knows had a tubal pregnancy after ligation.  She took a home pregnancy test that was negative.  She describes some discomfort of her left lower quadrant area.  She is requesting ultrasound.  She denies any fever, chills, nausea or vomiting or urinary symptoms.  No vaginal discharge or spotting.   Prior to Admission medications   Medication Sig Start Date End Date Taking? Authorizing Provider  ABILIFY 2 MG tablet Take 2 mg by mouth at bedtime. 04/23/21   [provider]  acetaminophen  (TYLENOL ) 500 MG tablet Take 500-1,000 mg by mouth every 6 (six) hours as needed (pain.).    [provider]  ciprofloxacin (CIPRO) 250 MG tablet Take 250 mg by mouth 2 (two) times daily. 10/12/23   [provider]  colestipol (COLESTID) 1 g tablet Take 2 g by mouth in the morning. 03/14/22   [provider]  fluconazole (DIFLUCAN) 150 MG tablet Take 150 mg by mouth once. 10/07/23   [provider]  gabapentin (NEURONTIN) 300 MG capsule Take 300 mg by mouth at bedtime.    [provider]  lamoTRIgine (LAMICTAL) 200 MG tablet Take 200 mg by mouth at bedtime.    [provider]  mirtazapine (REMERON) 45 MG tablet Take 45 mg by  mouth at bedtime. 12/31/21 07/04/23  [provider]  QUEtiapine (SEROQUEL) 50 MG tablet Take 75 mg by mouth at bedtime.    [provider]    Allergies: Diatrizoate, Nexium [esomeprazole magnesium ], and Other    Review of Systems  Constitutional:  Negative for appetite change, chills and fever.  Gastrointestinal:  Positive for abdominal pain. Negative for nausea and vomiting.  Genitourinary:  Positive for menstrual problem. Negative for decreased urine volume, difficulty urinating, dysuria, flank pain, vaginal bleeding, vaginal discharge and vaginal pain.  Musculoskeletal:  Negative for back pain.  Psychiatric/Behavioral:  The patient is nervous/anxious.     Updated Vital Signs BP 120/76 (BP Location: Right Arm)   Pulse 92   Temp 98.5 F (36.9 C) (Oral)   Resp 17   Ht 4' 11 (1.499 m)   Wt 40.4 kg   SpO2 100%   BMI 17.98 kg/m   Physical Exam Vitals and nursing note reviewed.  Constitutional:      Appearance: She is well-developed. She is not ill-appearing.     Comments: Patient appears anxious  HENT:     Mouth/Throat:     Mouth: Mucous membranes are moist.  Cardiovascular:     Rate and Rhythm: Normal rate and regular rhythm.     Pulses: Normal pulses.  Pulmonary:     Effort: Pulmonary effort is normal.  Abdominal:     General: There is no distension.  Palpations: Abdomen is soft.     Tenderness: There is no abdominal tenderness. There is no right CVA tenderness or left CVA tenderness.     Comments:  Abdomen is soft nontender on exam.  There is no guarding or rebound tenderness.  Skin:    General: Skin is warm.     Capillary Refill: Capillary refill takes less than 2 seconds.  Neurological:     General: No focal deficit present.     Mental Status: She is alert.     (all labs ordered are listed, but only abnormal results are displayed) Labs Reviewed  CBC WITH DIFFERENTIAL/PLATELET - Abnormal; Notable for the following components:      Result  Value   Hemoglobin 9.5 (*)    HCT 32.4 (*)    MCV 64.8 (*)    MCH 19.0 (*)    MCHC 29.3 (*)    RDW 17.8 (*)    Platelets 440 (*)    All other components within normal limits  HCG, QUANTITATIVE, PREGNANCY  BASIC METABOLIC PANEL WITH GFR  URINALYSIS, ROUTINE W REFLEX MICROSCOPIC    EKG: None  Radiology: CT ABDOMEN PELVIS W CONTRAST Result Date: 04/12/2024 CLINICAL DATA:  Left lower quadrant abdominal pain. EXAM: CT ABDOMEN AND PELVIS WITH CONTRAST TECHNIQUE: Multidetector CT imaging of the abdomen and pelvis was performed using the standard protocol following bolus administration of intravenous contrast. RADIATION DOSE REDUCTION: This exam was performed according to the departmental dose-optimization program which includes automated exposure control, adjustment of the mA and/or kV according to patient size and/or use of iterative reconstruction technique. CONTRAST:  80mL OMNIPAQUE IOHEXOL 300 MG/ML  SOLN COMPARISON:  CT abdomen pelvis dated 03/13/2017. FINDINGS: Lower chest: The visualized lung bases are clear. No intra-abdominal free air or free fluid. Hepatobiliary: The liver is unremarkable. No biliary dilatation. Cholecystectomy. Pancreas: Unremarkable. No pancreatic ductal dilatation or surrounding inflammatory changes. Spleen: Normal in size without focal abnormality. Adrenals/Urinary Tract: The renal glands are unremarkable. The kidneys, visualized ureters, and urinary bladder appear unremarkable. Stomach/Bowel: There is moderate stool throughout the colon. There is no bowel obstruction or active inflammation. The appendix is normal. Vascular/Lymphatic: The abdominal aorta and IVC are unremarkable. No portal venous gas. There is no adenopathy. Reproductive: The uterus is anteverted. Right ovarian dominant follicles or corpus luteum. No suspicious adnexal masses. Other: None Musculoskeletal: No acute osseous pathology. IMPRESSION: 1. No acute intra-abdominal or pelvic pathology. 2. Moderate  colonic stool burden. No bowel obstruction. Normal appendix. Electronically Signed   By: Vanetta Chou M.D.   On: 04/12/2024 21:23     Procedures   Medications Ordered in the ED  iohexol (OMNIPAQUE) 300 MG/ML solution 80 mL (80 mLs Intravenous Contrast Given 04/12/24 2102)                                    Medical Decision Making   Patient here with some left lower quadrant discomfort and concern of possible tubal pregnancy.  Endorses tubal ligation 3 years ago.  No vaginal discharge or bleeding.  No nausea vomiting fever or dysuria.  Differential to include but not limited to ovarian cyst, diverticulitis, pregnancy after tubal ligation also considered but felt less likely.  I also feel that PID, also less likely given reassuring exam  Amount and/or Complexity of Data Reviewed Labs: ordered.    Details: Hemoglobin 9.5, similar to 3 years ago.  Remaining labs quantitative hCG less than  1 Radiology: ordered.    Details: CT abdomen and pelvis without acute intra-abdominal or pelvic pathology.  There is moderate colonic stool burden no obstruction and normal-appearing index.  No suspicious adnexal masses Discussion of management or test interpretation with external provider(s):   Workup reassuring.  Patient requesting ultrasound but unavailable after hours.  CT abdomen and pelvis was ordered with acute pelvic pathology.  She has a reassuring abdominal exam and her pregnancy test is negative.  Recommended over-the-counter MiraLAX for her constipation as this may be because of her left lower quadrant discomfort.  Patient reassured.  I recommended close outpatient follow-up with her OB/GYN if her amenorrhea continues and she is agreeable to plan.  Risk Prescription drug management.        Final diagnoses:  Pelvic pain in female  Constipation, unspecified constipation type    ED Discharge Orders     None          Herlinda Milling, PA-C 04/14/24 1551    Melvenia Motto,  MD 04/17/24 [phone]
